# Patient Record
Sex: Female | Born: 1945
Health system: Southern US, Community
[De-identification: ages and names within clinical notes are randomized; demographics above are authoritative.]

## PROBLEM LIST (undated history)

## (undated) DIAGNOSIS — E119 Type 2 diabetes mellitus without complications: Secondary | ICD-10-CM

## (undated) DIAGNOSIS — I2510 Atherosclerotic heart disease of native coronary artery without angina pectoris: Secondary | ICD-10-CM

## (undated) DIAGNOSIS — J45909 Unspecified asthma, uncomplicated: Secondary | ICD-10-CM

## (undated) DIAGNOSIS — F0390 Unspecified dementia without behavioral disturbance: Secondary | ICD-10-CM

## (undated) DIAGNOSIS — K7689 Other specified diseases of liver: Secondary | ICD-10-CM

## (undated) DIAGNOSIS — N289 Disorder of kidney and ureter, unspecified: Secondary | ICD-10-CM

## (undated) DIAGNOSIS — T4145XA Adverse effect of unspecified anesthetic, initial encounter: Secondary | ICD-10-CM

## (undated) DIAGNOSIS — L0390 Cellulitis, unspecified: Secondary | ICD-10-CM

## (undated) DIAGNOSIS — I10 Essential (primary) hypertension: Secondary | ICD-10-CM

## (undated) DIAGNOSIS — I341 Nonrheumatic mitral (valve) prolapse: Secondary | ICD-10-CM

## (undated) DIAGNOSIS — F32A Depression, unspecified: Secondary | ICD-10-CM

## (undated) DIAGNOSIS — E039 Hypothyroidism, unspecified: Secondary | ICD-10-CM

## (undated) DIAGNOSIS — R42 Dizziness and giddiness: Secondary | ICD-10-CM

## (undated) DIAGNOSIS — J189 Pneumonia, unspecified organism: Secondary | ICD-10-CM

## (undated) DIAGNOSIS — I639 Cerebral infarction, unspecified: Secondary | ICD-10-CM

## (undated) DIAGNOSIS — Z972 Presence of dental prosthetic device (complete) (partial): Secondary | ICD-10-CM

## (undated) DIAGNOSIS — F329 Major depressive disorder, single episode, unspecified: Secondary | ICD-10-CM

## (undated) DIAGNOSIS — M1990 Unspecified osteoarthritis, unspecified site: Secondary | ICD-10-CM

## (undated) DIAGNOSIS — R059 Cough, unspecified: Secondary | ICD-10-CM

## (undated) DIAGNOSIS — E7800 Pure hypercholesterolemia, unspecified: Secondary | ICD-10-CM

## (undated) DIAGNOSIS — K219 Gastro-esophageal reflux disease without esophagitis: Secondary | ICD-10-CM

## (undated) DIAGNOSIS — T8859XA Other complications of anesthesia, initial encounter: Secondary | ICD-10-CM

## (undated) DIAGNOSIS — R413 Other amnesia: Secondary | ICD-10-CM

## (undated) DIAGNOSIS — G510 Bell's palsy: Secondary | ICD-10-CM

## (undated) DIAGNOSIS — K5792 Diverticulitis of intestine, part unspecified, without perforation or abscess without bleeding: Secondary | ICD-10-CM

## (undated) DIAGNOSIS — K2970 Gastritis, unspecified, without bleeding: Secondary | ICD-10-CM

## (undated) DIAGNOSIS — C801 Malignant (primary) neoplasm, unspecified: Secondary | ICD-10-CM

## (undated) DIAGNOSIS — Z8719 Personal history of other diseases of the digestive system: Secondary | ICD-10-CM

## (undated) DIAGNOSIS — G4730 Sleep apnea, unspecified: Secondary | ICD-10-CM

## (undated) DIAGNOSIS — R05 Cough: Secondary | ICD-10-CM

## (undated) HISTORY — PX: ABDOMINAL HYSTERECTOMY: SHX81

## (undated) HISTORY — PX: HEMORRHOID SURGERY: SHX153

## (undated) HISTORY — PX: CARPAL TUNNEL RELEASE: SHX101

## (undated) HISTORY — PX: CHOLECYSTECTOMY: SHX55

## (undated) HISTORY — PX: APPENDECTOMY: SHX54

## (undated) HISTORY — PX: LIVER SURGERY: SHX698

## (undated) HISTORY — PX: COLONOSCOPY: SHX174

---

## 1998-03-12 ENCOUNTER — Other Ambulatory Visit: Admission: RE | Admit: 1998-03-12 | Discharge: 1998-03-12 | Payer: Self-pay | Admitting: Family Medicine

## 1998-09-03 ENCOUNTER — Ambulatory Visit (HOSPITAL_COMMUNITY): Admission: RE | Admit: 1998-09-03 | Discharge: 1998-09-03 | Payer: Self-pay | Admitting: Otolaryngology

## 1998-09-14 ENCOUNTER — Ambulatory Visit (HOSPITAL_COMMUNITY): Admission: RE | Admit: 1998-09-14 | Discharge: 1998-09-14 | Payer: Self-pay

## 1999-07-19 ENCOUNTER — Observation Stay (HOSPITAL_COMMUNITY): Admission: EM | Admit: 1999-07-19 | Discharge: 1999-07-20 | Payer: Self-pay | Admitting: Emergency Medicine

## 1999-07-24 ENCOUNTER — Encounter: Admission: RE | Admit: 1999-07-24 | Discharge: 1999-07-24 | Payer: Self-pay | Admitting: Family Medicine

## 1999-10-31 ENCOUNTER — Other Ambulatory Visit: Admission: RE | Admit: 1999-10-31 | Discharge: 1999-10-31 | Payer: Self-pay | Admitting: Family Medicine

## 1999-11-07 ENCOUNTER — Encounter: Admission: RE | Admit: 1999-11-07 | Discharge: 1999-11-07 | Payer: Self-pay | Admitting: Family Medicine

## 1999-11-07 ENCOUNTER — Encounter: Payer: Self-pay | Admitting: Family Medicine

## 2000-04-20 ENCOUNTER — Encounter: Payer: Self-pay | Admitting: Family Medicine

## 2000-04-20 ENCOUNTER — Encounter: Admission: RE | Admit: 2000-04-20 | Discharge: 2000-04-20 | Payer: Self-pay | Admitting: Family Medicine

## 2000-04-28 ENCOUNTER — Encounter: Payer: Self-pay | Admitting: Family Medicine

## 2000-04-28 ENCOUNTER — Ambulatory Visit (HOSPITAL_COMMUNITY): Admission: RE | Admit: 2000-04-28 | Discharge: 2000-04-28 | Payer: Self-pay | Admitting: Family Medicine

## 2000-05-27 ENCOUNTER — Encounter: Payer: Self-pay | Admitting: Family Medicine

## 2000-05-27 ENCOUNTER — Encounter: Admission: RE | Admit: 2000-05-27 | Discharge: 2000-05-27 | Payer: Self-pay | Admitting: Family Medicine

## 2000-09-21 ENCOUNTER — Encounter: Payer: Self-pay | Admitting: Family Medicine

## 2000-09-21 ENCOUNTER — Encounter: Admission: RE | Admit: 2000-09-21 | Discharge: 2000-09-21 | Payer: Self-pay | Admitting: Family Medicine

## 2001-03-25 ENCOUNTER — Other Ambulatory Visit: Admission: RE | Admit: 2001-03-25 | Discharge: 2001-03-25 | Payer: Self-pay | Admitting: Family Medicine

## 2001-05-12 ENCOUNTER — Ambulatory Visit (HOSPITAL_BASED_OUTPATIENT_CLINIC_OR_DEPARTMENT_OTHER): Admission: RE | Admit: 2001-05-12 | Discharge: 2001-05-12 | Payer: Self-pay | Admitting: Orthopedic Surgery

## 2003-02-27 ENCOUNTER — Other Ambulatory Visit: Admission: RE | Admit: 2003-02-27 | Discharge: 2003-02-27 | Payer: Self-pay | Admitting: Family Medicine

## 2005-08-20 ENCOUNTER — Other Ambulatory Visit: Admission: RE | Admit: 2005-08-20 | Discharge: 2005-08-20 | Payer: Self-pay | Admitting: Family Medicine

## 2008-03-07 ENCOUNTER — Ambulatory Visit: Payer: Self-pay | Admitting: Internal Medicine

## 2008-03-07 ENCOUNTER — Inpatient Hospital Stay (HOSPITAL_BASED_OUTPATIENT_CLINIC_OR_DEPARTMENT_OTHER): Admission: RE | Admit: 2008-03-07 | Discharge: 2008-03-07 | Payer: Self-pay | Admitting: Cardiology

## 2009-10-01 ENCOUNTER — Ambulatory Visit: Payer: Self-pay | Admitting: Internal Medicine

## 2009-11-21 ENCOUNTER — Ambulatory Visit: Payer: Self-pay | Admitting: Otolaryngology

## 2009-12-20 ENCOUNTER — Ambulatory Visit: Payer: Self-pay | Admitting: Internal Medicine

## 2010-02-23 ENCOUNTER — Emergency Department: Payer: Self-pay | Admitting: Unknown Physician Specialty

## 2010-04-22 ENCOUNTER — Encounter: Payer: Self-pay | Admitting: Gastroenterology

## 2010-04-22 ENCOUNTER — Ambulatory Visit: Payer: Self-pay | Admitting: Gastroenterology

## 2010-05-28 ENCOUNTER — Ambulatory Visit: Payer: Self-pay | Admitting: Gastroenterology

## 2010-12-08 ENCOUNTER — Emergency Department: Payer: Self-pay | Admitting: Emergency Medicine

## 2010-12-08 ENCOUNTER — Encounter: Payer: Self-pay | Admitting: Radiology

## 2010-12-08 ENCOUNTER — Encounter: Payer: Self-pay | Admitting: Gastroenterology

## 2010-12-13 ENCOUNTER — Encounter: Payer: Self-pay | Admitting: Gastroenterology

## 2010-12-24 ENCOUNTER — Telehealth (INDEPENDENT_AMBULATORY_CARE_PROVIDER_SITE_OTHER): Payer: Self-pay | Admitting: *Deleted

## 2010-12-24 ENCOUNTER — Encounter (INDEPENDENT_AMBULATORY_CARE_PROVIDER_SITE_OTHER): Payer: Self-pay | Admitting: *Deleted

## 2010-12-24 DIAGNOSIS — R933 Abnormal findings on diagnostic imaging of other parts of digestive tract: Secondary | ICD-10-CM | POA: Insufficient documentation

## 2011-01-02 ENCOUNTER — Ambulatory Visit (HOSPITAL_COMMUNITY)
Admission: RE | Admit: 2011-01-02 | Discharge: 2011-01-02 | Disposition: A | Discharge status: Home / Self Care | Payer: Medicare Other | Source: Ambulatory Visit | Attending: Gastroenterology | Admitting: Gastroenterology

## 2011-01-02 ENCOUNTER — Encounter: Payer: Medicare Other | Admitting: Gastroenterology

## 2011-01-02 ENCOUNTER — Encounter: Payer: Self-pay | Admitting: Gastroenterology

## 2011-01-02 DIAGNOSIS — Z9089 Acquired absence of other organs: Secondary | ICD-10-CM | POA: Insufficient documentation

## 2011-01-02 DIAGNOSIS — E119 Type 2 diabetes mellitus without complications: Secondary | ICD-10-CM | POA: Insufficient documentation

## 2011-01-02 DIAGNOSIS — G4730 Sleep apnea, unspecified: Secondary | ICD-10-CM | POA: Insufficient documentation

## 2011-01-02 DIAGNOSIS — I10 Essential (primary) hypertension: Secondary | ICD-10-CM | POA: Insufficient documentation

## 2011-01-02 DIAGNOSIS — Z01812 Encounter for preprocedural laboratory examination: Secondary | ICD-10-CM | POA: Insufficient documentation

## 2011-01-02 DIAGNOSIS — K838 Other specified diseases of biliary tract: Secondary | ICD-10-CM

## 2011-01-02 DIAGNOSIS — R932 Abnormal findings on diagnostic imaging of liver and biliary tract: Secondary | ICD-10-CM

## 2011-01-02 DIAGNOSIS — K222 Esophageal obstruction: Secondary | ICD-10-CM | POA: Insufficient documentation

## 2011-01-02 DIAGNOSIS — Z800 Family history of malignant neoplasm of digestive organs: Secondary | ICD-10-CM | POA: Insufficient documentation

## 2011-01-02 LAB — GLUCOSE, CAPILLARY: Glucose-Capillary: 119 mg/dL — ABNORMAL HIGH (ref 70–99)

## 2011-01-02 NOTE — Letter (Signed)
Summary: Bunnie Pion MD/[facility]  Danella Penton MD/[facility]   Imported By: Lester [place] 12/27/2010 16:41:51  _____________________________________________________________________  External Attachment:    Type:   Image     Comment:   External Document

## 2011-01-02 NOTE — Procedures (Signed)
Summary: Lucas Mallow MD  Lucas Mallow MD   Imported By: Lester [place] 12/27/2010 16:45:35  _____________________________________________________________________  External Attachment:    Type:   Image     Comment:   External Document

## 2011-01-02 NOTE — Progress Notes (Signed)
Summary: EUS  Phone Note Outgoing Call Call back at [facility] Phone [phone]   Call placed by: Chales Abrahams CMA Duncan Dull),  December 24, 2010 3:58 PM Summary of Call: pt scheduled for EUS need to review meds and instruct pt pt also needs to hand carry ct on disc to appt Initial call taken by: Chales Abrahams CMA Duncan Dull),  December 24, 2010 4:04 PM  Follow-up for Phone Call        pt aware meds reviewed and instructed,  pt is taking metformina and she was advised to NOT take the metformin the day of the procedure. Follow-up by: Chales Abrahams CMA (AAMA),  December 25, 2010 1:32 PM  New Problems: NONSPECIFIC ABN FINDING RAD & OTH EXAM GI TRACT (ICD-793.4)   New Problems: NONSPECIFIC ABN FINDING RAD & OTH EXAM GI TRACT (ICD-793.4)

## 2011-01-02 NOTE — Procedures (Signed)
Summary: Upper Fl[place]ne Route MD  Upper Florene Route MD   Imported By: Lester [place] 12/27/2010 16:44:16  _____________________________________________________________________  External Attachment:    Type:   Image     Comment:   External Document

## 2011-01-02 NOTE — Letter (Signed)
Summary: EGD Instructions   Gastroenterology  [address] [place], [place] [address]   Phone: [phone]  Fax: [phone]       Cathy Hines    [birth year]    MRN: [id number]       Procedure Day /Date:01/02/11 THURS     Arrival Time:10 am     Procedure Time:11 am     Location of Procedure:                     [facility] ( Outpatient Registration)   PREPARATION FOR ENDOSCOPY   On 01/02/11  THE DAY OF THE PROCEDURE:  1.   No solid foods, milk or milk products are allowed after midnight the night before your procedure.  2.   Do not drink anything colored red or purple.  Avoid juices with pulp.  No orange juice.  3.  You may drink clear liquids until 7 am, which is 4 hours before your procedure.                                                                                                CLEAR LIQUIDS INCLUDE: Water Jello Ice Popsicles Tea (sugar ok, no milk/cream) Powdered fruit flavored drinks Coffee (sugar ok, no milk/cream) Gatorade Juice: apple, white grape, white cranberry  Lemonade Clear bullion, consomm, broth Carbonated beverages (any kind) Strained chicken noodle soup Hard Candy   MEDICATION INSTRUCTIONS  Unless otherwise instructed, you should take regular prescription medications with a small sip of water as early as possible the morning of your procedure.             OTHER INSTRUCTIONS  You will need a responsible adult at least 67 years of age to accompany you and drive you home.   This person must remain in the waiting room during your procedure.  Wear loose fitting clothing that is easily removed.  Leave jewelry and other valuables at home.  However, you may wish to bring a book to read or an iPod/MP3 player to listen to music as you wait for your procedure to start.  Remove all body piercing jewelry and leave at home.  Total time from sign-in until discharge is approximately 2-3 hours.  You should go home  directly after your procedure and rest.  You can resume normal activities the day after your procedure.  The day of your procedure you should not:   Drive   Make legal decisions   Operate machinery   Drink alcohol   Return to work  You will receive specific instructions about eating, activities and medications before you leave.    The above instructions have been reviewed and explained to me by   Chales Abrahams CMA (AAMA)  December 24, 2010 4:00 PM     I fully understand and can verbalize these instructions over the phone mailed to home Date 12/24/10

## 2011-01-08 NOTE — Procedures (Signed)
Summary: Endoscopic Ultrasound  Patient: Cathy Hines Note: All result statuses are Final unless otherwise noted.  Tests: (1) Endoscopic Ultrasound (EUS)  EUS Endoscopic Ultrasound                             DONE     [facility]     [address] [place], [place]  [address]           ENDOSCOPIC ULTRASOUND PROCEDURE REPORT           PATIENT:  Cathy Hines, Cathy Hines  MR#:  [id number]     BIRTHDATE:  [birth year]  GENDER:  female     ENDOSCOPIST:  Rachael Fee, MD     REFERRED by:   Lutricia Feil, MD at [facility]     PROCEDURE DATE:  01/02/2011     PROCEDURE:  Upper EUS     ASA CLASS:  Class II     INDICATIONS:  dilated CBD, normal liver tests, sister had     pancreatic cancer; cholecystectomy 10 years ago     MEDICATIONS:  Fentanyl 100 mcg IV, Versed 9 mg IV           DESCRIPTION OF PROCEDURE:   After the risks benefits and     alternatives of the procedure were  explained, informed consent     was obtained. The patient was then placed in the left, lateral,     decubitus postion and IV sedation was administered. Throughout the     procedure, the patient's blood pressure, pulse and oxygen     saturations were monitored continuously.  Under direct     visualization, the Pentax EUS Radial [id number] endoscope was     introduced through the mouth and advanced to the second portion of     the duodenum.  Water was used as necessary to provide an acoustic     interface.  Upon completion of the imaging, water was removed and     the patient was sent to the recovery room in satisfactory     condition.           <<PROCEDUREIMAGES>>     Endoscopic findings:     1. Minor schatzki's type ring at GE junction     2. Normal stomach     3. Normal duodenum; normal appearing major papilla visualized with     ERCP scope           EUS findings (using radial echoendoscope):1. Dilated CBD that     tapers smoothly into head of pancreas, major papilla. No CBD     stones. Max diameter  9mm.     2. Main pancreatic duct was normal in head, neck , body and tail     of gland.     3. Pancreatic parenchyma was normal; no masses or signs of chronic     pancreatitis.     4. Gallbladder surgically absent.     5. No peripancreatic or celiac adenopathy.     6. Limited views of liver, spleen, portal and splenic vessels were     all normal.           Impression:     Dilated CBD without any evident biliary or pancreatic pathology.     Likely the dilation is "physiologic," related to remote     cholecystectomy.     Schatzki's ring.  ______________________________     Rachael Fee, MD           n.     eSIGNED:   Rachael Fee at 01/02/2011 11:45 AM           Cathy Hines, [id number]  Note: An exclamation mark (!) indicates a result that was not dispersed into the flowsheet. Document Creation Date: 01/02/2011 11:46 AM _______________________________________________________________________  (1) Order result status: Final Collection or observation date-time: 01/02/2011 11:37 Requested date-time:  Receipt date-time:  Reported date-time:  Referring Physician:   Ordering Physician: Rob Bunting [phone]) Specimen Source:  Source: Launa Grill Order Number: [id number] Lab site:

## 2011-02-15 ENCOUNTER — Inpatient Hospital Stay: Payer: Self-pay | Admitting: Internal Medicine

## 2011-03-18 ENCOUNTER — Ambulatory Visit: Payer: Self-pay | Admitting: Internal Medicine

## 2011-04-01 NOTE — H&P (Signed)
NAMEREBECAH, DANGERFIELD NO.:  [id number]   MEDICAL RECORD NO.:  [id number]          PATIENT TYPE:   LOCATION:                                 FACILITY:   PHYSICIAN:  Peter M. Swaziland, M.D.  DATE OF BIRTH:  06-16-1946   DATE OF ADMISSION:  DATE OF DISCHARGE:                              HISTORY & PHYSICAL   HISTORY OF PRESENT ILLNESS:  Ms. Givhan is a 65 year old white female  with multiple cardiac risk factors who presents with a 74-month history  of increasing anginal symptoms.  She describes this as a severe  tightness in her chest and pain, feels like pressure associated with the  shortness of breath and dizziness.  She also notes that she has been  giving out very easily with marked fatigue.  She has been taking a lot  of antacids without much improvement.  Her symptoms are worse with  exertion, they typically last 5-10 minutes and are relieved with rest.  She was subsequently referred for adenosine Cardiolite study which was  performed on February 28, 2008.  This study was abnormal, showing evidence  of anterior wall ischemia.  Ejection fraction was normal at 70%.  The  patient is now referred for cardiac catheterization.   PAST MEDICAL HISTORY:  1. Diabetes mellitus type 2.  2. Hypercholesterolemia.  3. History of mitral valve prolapse.  4. Hypertension.  5. History of fibromyalgia.  6. History of hypothyroidism.  7. GERD.   PAST SURGICAL HISTORY:  Prior surgeries include cholecystectomy, total  hysterectomy, and hemorrhoidectomy.   ALLERGIES:  She is allergic to SULFA.   MEDICATIONS:  She also has multiple drug intolerance to cholesterol-  lowering medications, although the patient cannot recall which ones  specifically.  She was recently placed on WelChol.  Her current  medications include;  1. Lisinopril 30 mg per day.  2. Levoxyl daily.  3. Furosemide 20 mg per day.  4. WelChol 625 mg daily.  5. Nitroglycerin 0.4 mg p.r.n.  6. Ranitidine 150 mg  daily.  7. Acyclovir p.r.n.   SOCIAL HISTORY:  The patient is unemployed.  She is married.  She has 4  children, one daughter has a history of kidney cancer, one daughter has  a history of breast cancer.  The patient had previously been a 3- to 4-  pack per day smoker for over 20 years, but quit in 1997.  She denies  alcohol use.   FAMILY HISTORY:  Father died at age 61 with stroke.  He also had a  history of coronary disease and myocardial infarction.  Mother died at  age 75 with gangrene.  She also had a history of coronary disease and  hypertension.  Two brothers have passed away, one with coronary disease  and one had heart attack and question of aneurysm.  Three brothers are  still living.  Three sisters are still living.  One sister has a history  of pancreatic cancer, and one sister has passed away in a motor vehicle  accident.   REVIEW OF SYSTEMS:  The patient denies any significant claudication  symptoms.  She has had no history of TIA or stroke.  She is currently on  no diabetic medication, but is being managed with diet.  She again notes  multiple intolerances to several cholesterol-lowering medications.  She  states she has been told, she may possibly have multiple sclerosis and  possibly have sleep apnea.  Review of systems is, otherwise, negative in  detail.   PHYSICAL EXAMINATION:  GENERAL:  The patient is an overweight, white  female in no apparent distress.  VITAL SIGNS:  Weight is 168, blood pressure is 120/80, pulse is 78 and  regular, and respirations are 20 and unlabored.  HEENT:  She is normocephalic and atraumatic.  Pupils are equal, round,  reactive to light and accommodation.  Extraocular movements are full.  Oropharynx is clear.  NECK:  Supple without JVD, adenopathy, thyromegaly, or bruits.  LUNGS:  Clear to auscultation and percussion.  CARDIAC:  Reveals regular rate and rhythm without gallop, murmur, rub,  or click.  ABDOMEN:  Soft and nontender  without masses or bruits.  EXTREMITIES:  Femoral and pedal pulses 2+ and symmetric.  She has no  edema.  NEUROLOGIC:  Nonfocal.   LABORATORY DATA:  Chest x-ray showed no active disease.  Recent lipid  panel by Dr. Nathanial Rancher showed a total cholesterol of 232, HDL 39, LDL 163,  and triglycerides of 138.  ECG at rest shows normal sinus rhythm with  poor R-wave progression with no active ST, and electrolytes were normal.  Coags were normal.  CBC was normal.   IMPRESSION:  1. Recent onset of angina with abnormal adenosine Cardiolite study      showing evidence of anterior wall ischemia.  2. Diabetes mellitus type 2, diet regulated.  3. Hypercholesterolemia.  4. Hypertension.  5. Family history of early vascular disease.  6. Hypothyroidism.  7. Gastroesophageal reflux disease.  8. Status post cholecystectomy.   PLAN:  We will proceed with diagnostic cardiac catheterization.  I have  recommended the patient to start on aspirin daily, started her on Coreg  CR 10 mg per day.  Further therapy pending these results.           ______________________________  Peter M. Swaziland, M.D.     PMJ/MEDQ  D:  03/06/2008  T:  03/07/2008  Job:  [id number]   cc:   Burnell Blanks, MD

## 2011-04-01 NOTE — Cardiovascular Report (Signed)
Cathy Hines, Cathy Hines NO.:  [id number]   MEDICAL RECORD NO.:  [id number]          PATIENT TYPE:  OIB   LOCATION:  1965                         FACILITY:  [facility]   PHYSICIAN:  Peter M. Swaziland, M.D.  DATE OF BIRTH:  [birth year]   DATE OF PROCEDURE:  DATE OF DISCHARGE:                            CARDIAC CATHETERIZATION   INDICATION FOR PROCEDURE:  The patient is a 65 year old white female  with a history of diabetes, family history of coronary artery disease,  hypertension, hypercholesterolemia, presents with chest pain.  She had a  stress Cardiolite study, which was consistent with anterior wall  ischemia.  Left ventricular function was normal.   PROCEDURES:  Left heart catheterization, coronary and left ventricular  angiography.   EQUIPMENT:  4-French 4-cm left Judkins catheter, 4-French 3-D RCA  catheter, 4-French pigtail catheter, 4-French arterial sheath.   MEDICATIONS:  Local anesthesia 1% lidocaine, Versed 2 mg IV, contrast  100 mL of Omnipaque.  Access via the right femoral artery using the  standard Seldinger technique.   HEMODYNAMIC DATA:  Aortic pressure was 157/70 with a mean of 106.  Left  ventricle pressure was 157 with EDP of 25 mmHg.   ANGIOGRAPHIC DATA:  The left coronary artery rises and distributes  normally.  The left main coronary artery is normal.   The left anterior descending artery has a 20% narrowing proximally.  The  mid-to-distal vessel was small and very tortuous, but extends out to the  apex and no significant disease is noted.   Left circumflex coronary has a focal 20-30% stenosis in the midvessel.   The right coronary artery is a dominant vessel.  It has minor  irregularities in the proximal mid vessel less than 10%.   Left ventricular angiography was performed in RAO view.  This  demonstrates normal left ventricular size and contractility with normal  systolic function.  Ejection fraction is estimated at 60%.  There is  mild  prolapse of the posterior leaflet of the mitral valve without  significant mitral insufficiency.   FINAL INTERPRETATION:  1. Mild nonobstructive atherosclerotic coronary artery disease.  2. Normal left ventricular function.  3. Mild posterior mitral valve prolapse.   PLAN:  We would recommend evaluation for noncardiac causes of chest  pain.           ______________________________  Peter M. Swaziland, M.D.     PMJ/MEDQ  D:  03/07/2008  T:  03/08/2008  Job:  [id number]   cc:   Burnell Blanks, MD

## 2011-04-04 NOTE — Op Note (Signed)
[facility]  Patient:    Cathy Hines, Cathy Hines                        MRN: [id number] Proc. Date: 05/12/01 Attending:  Artist Pais. Mina Marble, M.D.                           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  PROCEDURE:  Decompression ulnar nerve, left elbow and left wrist, through separate incisions.  SURGEON:  Artist Pais. Mina Marble, M.D.  ASSISTANT:  Junius Roads. Ireton, P.A.C.  ANESTHESIA:  General.  TOURNIQUET TIME:  48 minutes.  COMPLICATIONS:  None.  DRAINS:  None.  DESCRIPTION OF PROCEDURE:  The patient was taken to the operating room and after the induction of adequate general anesthesia, the left upper extremity was prepped and draped in the usual sterile fashion.  Once this was done, an Esmarch was used to exsanguinate the limb, and the tourniquet was inflated to 250 mmHg.  At this point in time, the ulnar nerve at the wrist was approached through a Brunner-type incision in the area of Guyons canal.  The proximal extent of the incision was longitudinal overlying the SCU tendon and was then extended across the wrist crease in a Brunner-type fashion.  Flaps were raised accordingly.  The ulnar nerve and artery were identified proximal to Guyons canal, and the ulnar nerve was traced until the division of the deep motor branch and the superficial branch.  The nerve was completely decompressed.  It was extended through an area of 4 cm.  Once this was done, the wound was irrigated and closed with 5-0 nylon in a combination of simple and horizontal mattress sutures.  Next, the elbow was bent to 90 degrees and a 4-5 cm incision was made in the space between the medial epicondyle and the olecranon process.  The incision was taken down through the skin and subcutaneous tissues with care to  carefully identify and retract branches of the medial antebrachial cutaneous nerve.  Once this was done, the ulnar nerve was identified proximal to the cubital tunnel, was decompressed throughout the length of the cubital tunnel.  Distally it was decompressed to the heads of the flexor carpi ulnaris muscle bellies and proximally to the area of the intermuscular septum.  A small bit of the intermuscular septum and triceps was removed.  The wound was then thoroughly irrigated, hemostasis was achieved with bipolar cautery, and the wound was then closed with 4-0 Monocryl in a running subcuticular stitch.  Steri-Strips, 4 x 4s, fluffs, and a compressive dressing were applied.  The patient was then placed in a splint with the elbow flexed to 90 degrees and the wrist in slight extension with volar splinting. The patient tolerated the procedure well, went to the recovery room in stable fashion. DD:  05/12/01 TD:  05/12/01 Job: [id number]

## 2012-04-28 ENCOUNTER — Ambulatory Visit: Payer: Self-pay | Admitting: Internal Medicine

## 2012-05-06 ENCOUNTER — Ambulatory Visit: Payer: Self-pay | Admitting: Internal Medicine

## 2012-05-13 ENCOUNTER — Ambulatory Visit: Payer: Self-pay | Admitting: Gastroenterology

## 2012-07-29 ENCOUNTER — Ambulatory Visit: Payer: Self-pay | Admitting: Gastroenterology

## 2012-08-03 LAB — PATHOLOGY REPORT

## 2013-05-31 ENCOUNTER — Ambulatory Visit: Payer: Self-pay | Admitting: Internal Medicine

## 2014-06-26 ENCOUNTER — Ambulatory Visit: Payer: Self-pay | Admitting: Internal Medicine

## 2015-03-22 DIAGNOSIS — E119 Type 2 diabetes mellitus without complications: Secondary | ICD-10-CM | POA: Diagnosis not present

## 2015-03-22 DIAGNOSIS — K5792 Diverticulitis of intestine, part unspecified, without perforation or abscess without bleeding: Secondary | ICD-10-CM | POA: Diagnosis not present

## 2015-03-26 DIAGNOSIS — E119 Type 2 diabetes mellitus without complications: Secondary | ICD-10-CM | POA: Diagnosis not present

## 2015-03-26 DIAGNOSIS — K5792 Diverticulitis of intestine, part unspecified, without perforation or abscess without bleeding: Secondary | ICD-10-CM | POA: Diagnosis not present

## 2015-05-08 ENCOUNTER — Ambulatory Visit
Admission: RE | Admit: 2015-05-08 | Discharge: 2015-05-08 | Disposition: A | Discharge status: Home / Self Care | Payer: Commercial Managed Care - HMO | Source: Ambulatory Visit | Attending: Internal Medicine | Admitting: Internal Medicine

## 2015-05-08 ENCOUNTER — Other Ambulatory Visit: Payer: Self-pay | Admitting: Internal Medicine

## 2015-05-08 DIAGNOSIS — R079 Chest pain, unspecified: Secondary | ICD-10-CM

## 2015-05-08 DIAGNOSIS — R0600 Dyspnea, unspecified: Secondary | ICD-10-CM | POA: Diagnosis present

## 2015-05-08 DIAGNOSIS — M5010 Cervical disc disorder with radiculopathy, unspecified cervical region: Secondary | ICD-10-CM | POA: Diagnosis not present

## 2015-05-08 DIAGNOSIS — J439 Emphysema, unspecified: Secondary | ICD-10-CM | POA: Insufficient documentation

## 2015-05-08 DIAGNOSIS — R918 Other nonspecific abnormal finding of lung field: Secondary | ICD-10-CM | POA: Diagnosis not present

## 2015-05-08 HISTORY — DX: Type 2 diabetes mellitus without complications: E11.9

## 2015-05-08 HISTORY — DX: Essential (primary) hypertension: I10

## 2015-05-08 HISTORY — DX: Disorder of kidney and ureter, unspecified: N28.9

## 2015-05-08 HISTORY — DX: Malignant (primary) neoplasm, unspecified: C80.1

## 2015-05-10 DIAGNOSIS — R002 Palpitations: Secondary | ICD-10-CM | POA: Diagnosis not present

## 2015-05-10 DIAGNOSIS — M5010 Cervical disc disorder with radiculopathy, unspecified cervical region: Secondary | ICD-10-CM | POA: Diagnosis not present

## 2015-05-10 DIAGNOSIS — M659 Synovitis and tenosynovitis, unspecified: Secondary | ICD-10-CM | POA: Diagnosis not present

## 2015-05-23 ENCOUNTER — Other Ambulatory Visit: Payer: Self-pay | Admitting: Internal Medicine

## 2015-05-23 DIAGNOSIS — Z0000 Encounter for general adult medical examination without abnormal findings: Secondary | ICD-10-CM | POA: Diagnosis not present

## 2015-05-23 DIAGNOSIS — Z1231 Encounter for screening mammogram for malignant neoplasm of breast: Secondary | ICD-10-CM

## 2015-05-23 DIAGNOSIS — E119 Type 2 diabetes mellitus without complications: Secondary | ICD-10-CM | POA: Diagnosis not present

## 2015-06-27 DIAGNOSIS — J4522 Mild intermittent asthma with status asthmaticus: Secondary | ICD-10-CM | POA: Diagnosis not present

## 2015-06-27 DIAGNOSIS — J40 Bronchitis, not specified as acute or chronic: Secondary | ICD-10-CM | POA: Diagnosis not present

## 2015-06-28 ENCOUNTER — Ambulatory Visit: Payer: BLUE CROSS/BLUE SHIELD

## 2015-07-04 ENCOUNTER — Ambulatory Visit: Payer: BLUE CROSS/BLUE SHIELD

## 2015-07-11 ENCOUNTER — Ambulatory Visit: Payer: BLUE CROSS/BLUE SHIELD

## 2015-09-20 DIAGNOSIS — R197 Diarrhea, unspecified: Secondary | ICD-10-CM | POA: Diagnosis not present

## 2015-09-20 DIAGNOSIS — E039 Hypothyroidism, unspecified: Secondary | ICD-10-CM | POA: Diagnosis not present

## 2015-09-20 DIAGNOSIS — E119 Type 2 diabetes mellitus without complications: Secondary | ICD-10-CM | POA: Diagnosis not present

## 2015-10-03 ENCOUNTER — Ambulatory Visit
Admission: RE | Admit: 2015-10-03 | Discharge: 2015-10-03 | Disposition: A | Discharge status: Home / Self Care | Payer: Commercial Managed Care - HMO | Source: Ambulatory Visit | Attending: Internal Medicine | Admitting: Internal Medicine

## 2015-10-03 ENCOUNTER — Other Ambulatory Visit: Payer: Self-pay | Admitting: Internal Medicine

## 2015-10-03 DIAGNOSIS — Z1231 Encounter for screening mammogram for malignant neoplasm of breast: Secondary | ICD-10-CM | POA: Diagnosis not present

## 2015-10-26 DIAGNOSIS — L659 Nonscarring hair loss, unspecified: Secondary | ICD-10-CM | POA: Diagnosis not present

## 2015-10-30 DIAGNOSIS — L659 Nonscarring hair loss, unspecified: Secondary | ICD-10-CM | POA: Diagnosis not present

## 2015-10-30 DIAGNOSIS — L648 Other androgenic alopecia: Secondary | ICD-10-CM | POA: Diagnosis not present

## 2015-11-28 DIAGNOSIS — E119 Type 2 diabetes mellitus without complications: Secondary | ICD-10-CM | POA: Diagnosis not present

## 2015-11-28 DIAGNOSIS — M5010 Cervical disc disorder with radiculopathy, unspecified cervical region: Secondary | ICD-10-CM | POA: Diagnosis not present

## 2015-12-20 ENCOUNTER — Other Ambulatory Visit: Payer: Self-pay | Admitting: Internal Medicine

## 2015-12-20 DIAGNOSIS — I639 Cerebral infarction, unspecified: Secondary | ICD-10-CM

## 2015-12-20 DIAGNOSIS — R55 Syncope and collapse: Secondary | ICD-10-CM | POA: Diagnosis not present

## 2015-12-25 DIAGNOSIS — I493 Ventricular premature depolarization: Secondary | ICD-10-CM | POA: Diagnosis not present

## 2015-12-25 DIAGNOSIS — R55 Syncope and collapse: Secondary | ICD-10-CM | POA: Diagnosis not present

## 2015-12-31 DIAGNOSIS — I639 Cerebral infarction, unspecified: Secondary | ICD-10-CM | POA: Diagnosis not present

## 2015-12-31 DIAGNOSIS — R55 Syncope and collapse: Secondary | ICD-10-CM | POA: Diagnosis not present

## 2016-01-03 DIAGNOSIS — I493 Ventricular premature depolarization: Secondary | ICD-10-CM | POA: Diagnosis not present

## 2016-01-03 DIAGNOSIS — R55 Syncope and collapse: Secondary | ICD-10-CM | POA: Diagnosis not present

## 2016-01-10 ENCOUNTER — Ambulatory Visit
Admission: RE | Admit: 2016-01-10 | Discharge: 2016-01-10 | Disposition: A | Discharge status: Home / Self Care | Payer: Commercial Managed Care - HMO | Source: Ambulatory Visit | Attending: Internal Medicine | Admitting: Internal Medicine

## 2016-01-10 DIAGNOSIS — R55 Syncope and collapse: Secondary | ICD-10-CM | POA: Diagnosis not present

## 2016-01-10 DIAGNOSIS — I639 Cerebral infarction, unspecified: Secondary | ICD-10-CM

## 2016-01-10 MED ORDER — GADOBENATE DIMEGLUMINE 529 MG/ML IV SOLN
15.0000 mL | Freq: Once | INTRAVENOUS | Status: AC | PRN
  Administered 2016-01-10: 14 mL via INTRAVENOUS

## 2016-01-29 DIAGNOSIS — N3090 Cystitis, unspecified without hematuria: Secondary | ICD-10-CM | POA: Diagnosis not present

## 2016-01-29 DIAGNOSIS — I493 Ventricular premature depolarization: Secondary | ICD-10-CM | POA: Diagnosis not present

## 2016-05-02 IMAGING — MG MM SCREENING BREAST TOMO BILATERAL
9 of 12 series · 9 of 28 positions shown · non-contrast
Comparison: Previous exam(s).

CLINICAL DATA: Screening.

EXAM:
DIGITAL SCREENING BILATERAL MAMMOGRAM WITH 3D TOMO WITH CAD

[R MLO synth-2D]
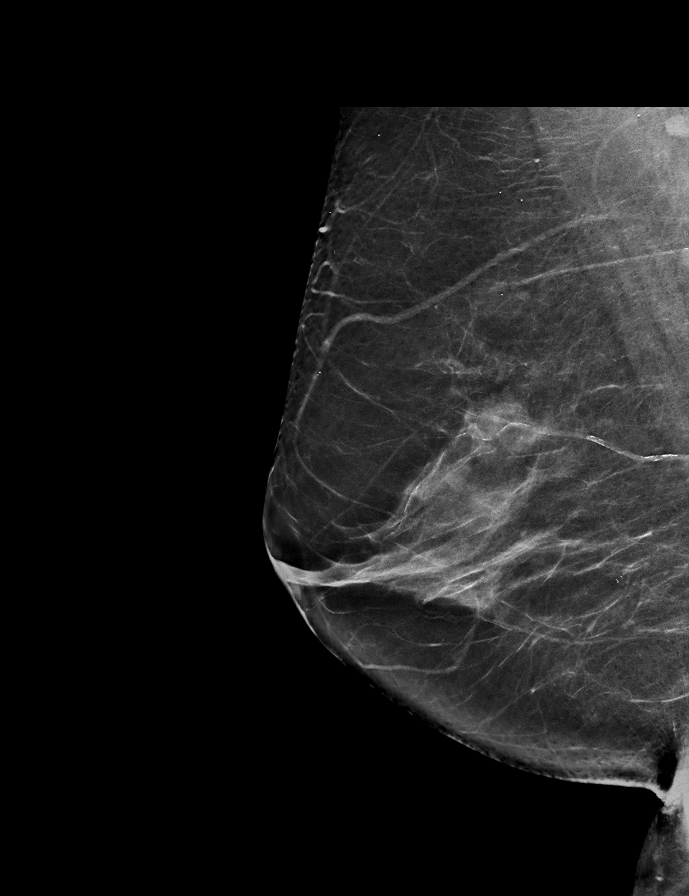

[R MLO]
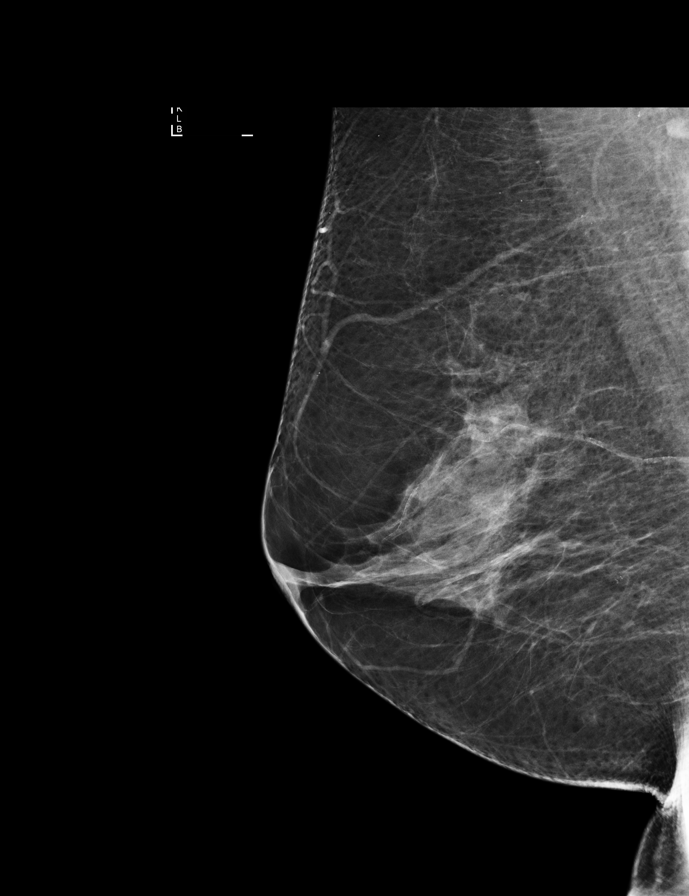

[L CC]
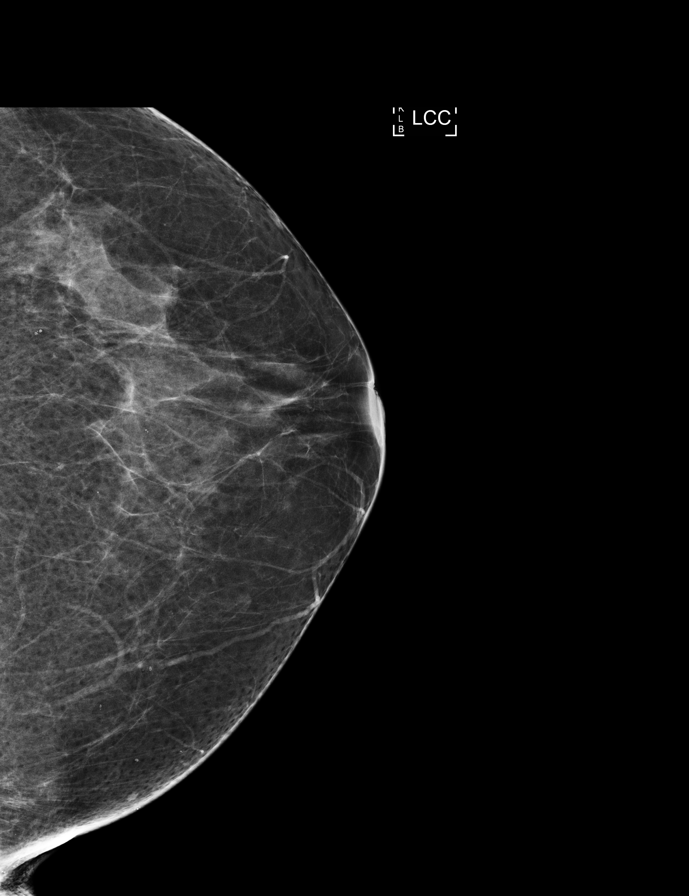

[L MLO]
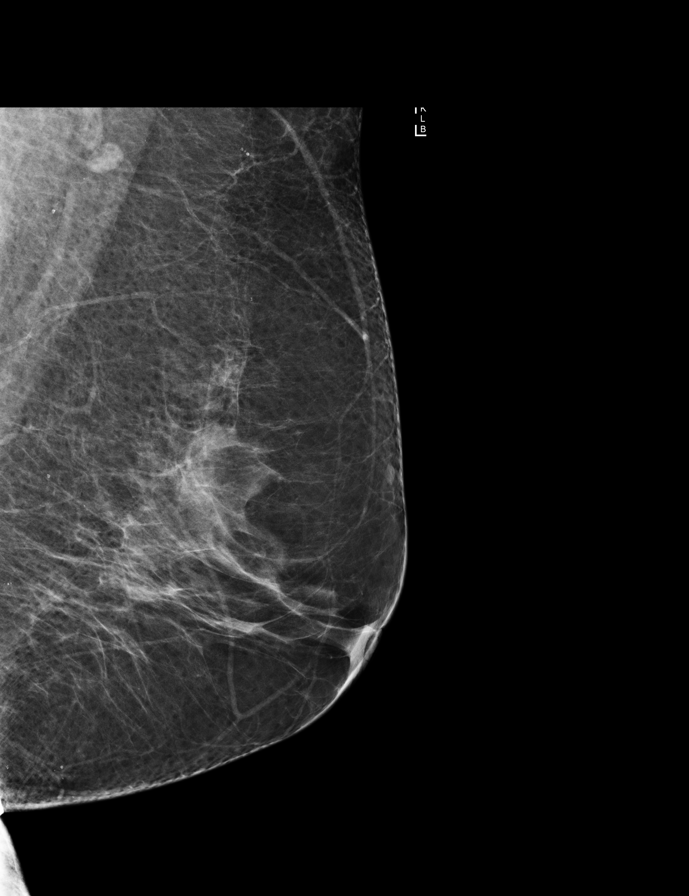

[R CC synth-2D]
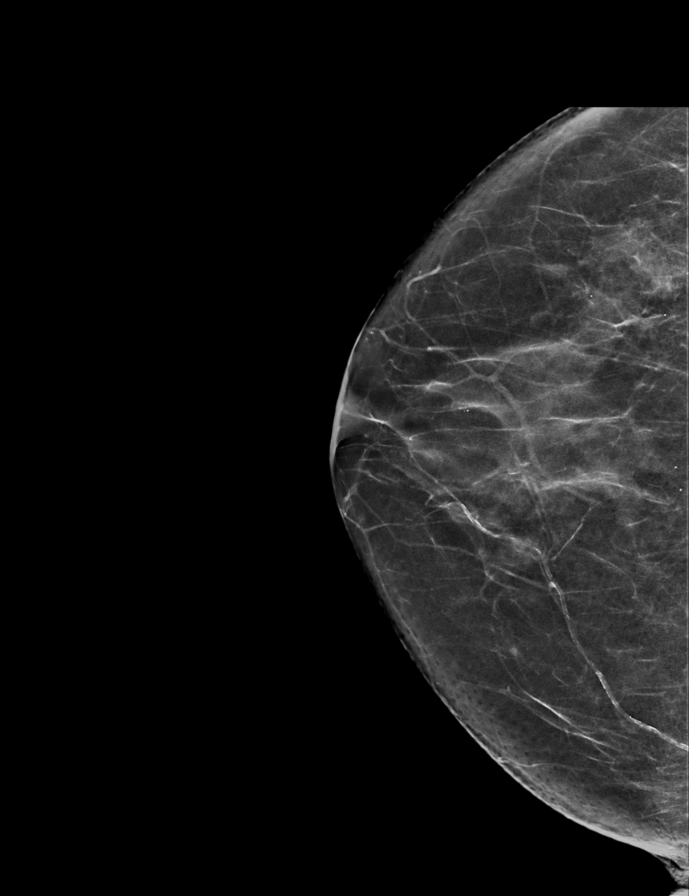

[L MLO synth-2D]
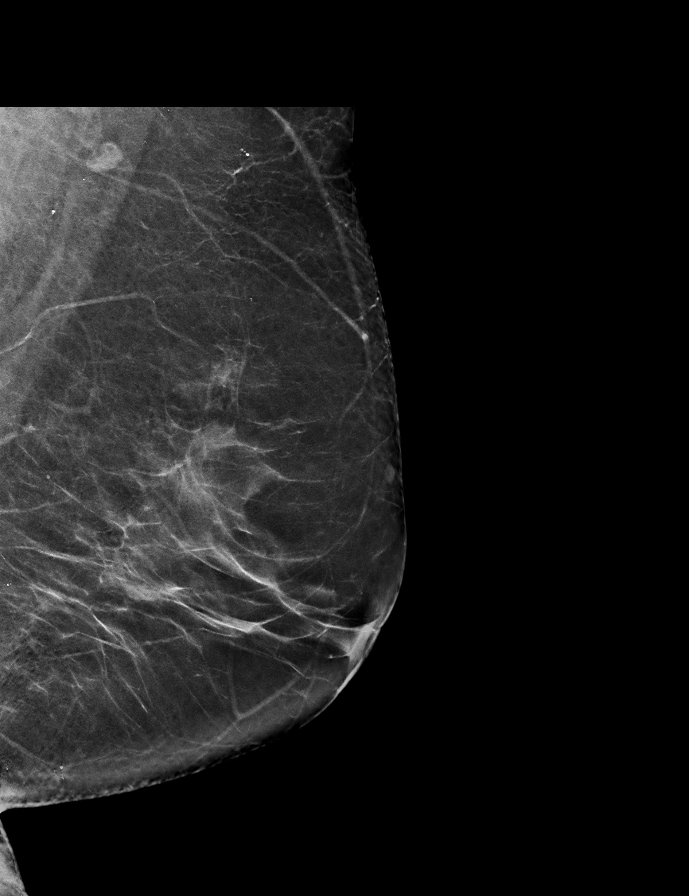

[L CC synth-2D]
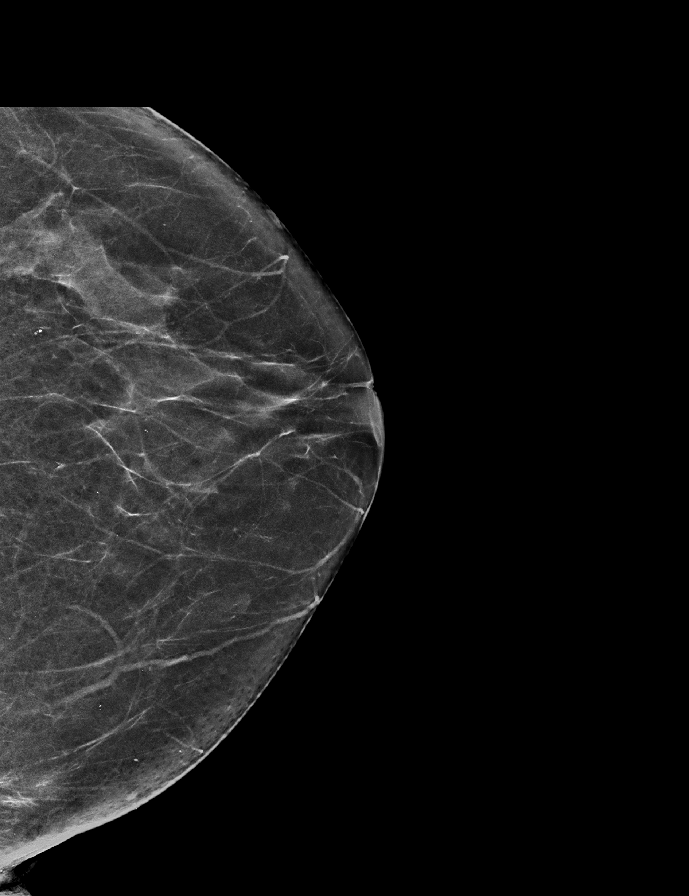

[R CC]
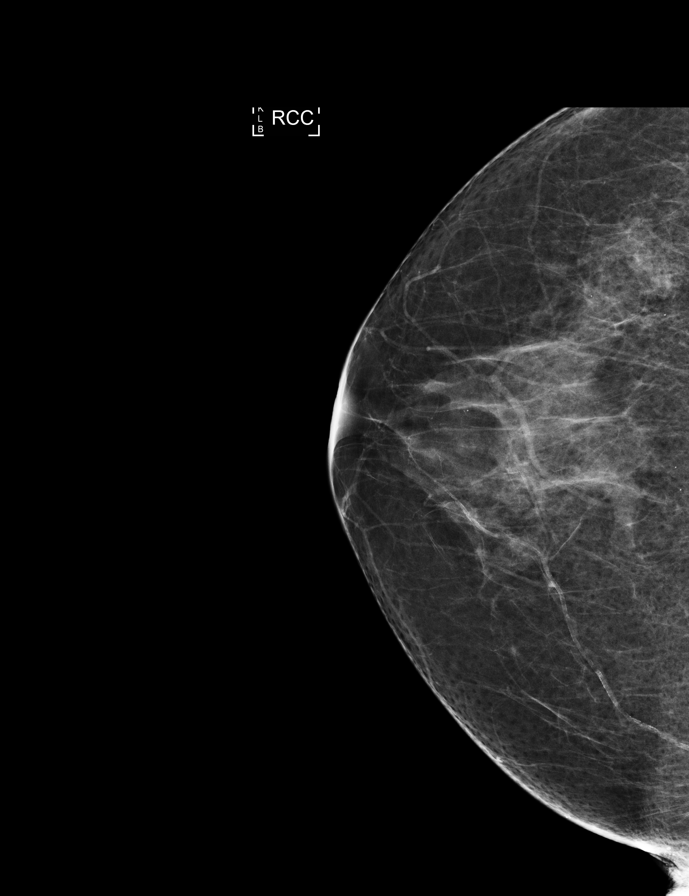

[L MLO tomo · tomo slice 37/73.0]
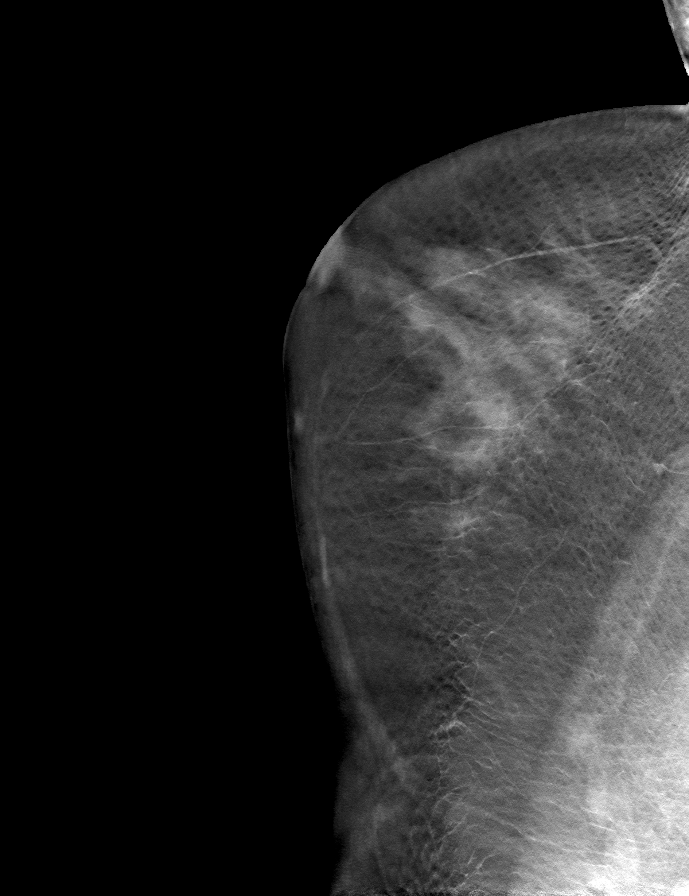

[9 of 28 positions shown; findings below may reference images not displayed]

ACR Breast Density Category b: There are scattered areas of
fibroglandular density.
FINDINGS: There are no findings suspicious for malignancy. Images were
processed with CAD.
IMPRESSION: No mammographic evidence of malignancy. A result letter of this
screening mammogram will be mailed directly to the patient.

RECOMMENDATION:
Screening mammogram in one year. (Code:55-L-23V)

BI-RADS CATEGORY  1: Negative.

## 2016-05-26 DIAGNOSIS — E119 Type 2 diabetes mellitus without complications: Secondary | ICD-10-CM | POA: Diagnosis not present

## 2016-06-02 DIAGNOSIS — M7061 Trochanteric bursitis, right hip: Secondary | ICD-10-CM | POA: Diagnosis not present

## 2016-06-02 DIAGNOSIS — R05 Cough: Secondary | ICD-10-CM | POA: Diagnosis not present

## 2016-06-02 DIAGNOSIS — E119 Type 2 diabetes mellitus without complications: Secondary | ICD-10-CM | POA: Diagnosis not present

## 2016-06-02 DIAGNOSIS — Z0000 Encounter for general adult medical examination without abnormal findings: Secondary | ICD-10-CM | POA: Diagnosis not present

## 2016-07-30 DIAGNOSIS — I160 Hypertensive urgency: Secondary | ICD-10-CM | POA: Diagnosis not present

## 2016-10-20 ENCOUNTER — Other Ambulatory Visit: Payer: Self-pay | Admitting: Internal Medicine

## 2016-10-20 DIAGNOSIS — Z1231 Encounter for screening mammogram for malignant neoplasm of breast: Secondary | ICD-10-CM

## 2016-10-23 DIAGNOSIS — E119 Type 2 diabetes mellitus without complications: Secondary | ICD-10-CM | POA: Diagnosis not present

## 2016-10-28 ENCOUNTER — Ambulatory Visit
Admission: RE | Admit: 2016-10-28 | Discharge: 2016-10-28 | Disposition: A | Discharge status: Home / Self Care | Payer: Commercial Managed Care - HMO | Source: Ambulatory Visit | Attending: Internal Medicine | Admitting: Internal Medicine

## 2016-10-28 DIAGNOSIS — Z1231 Encounter for screening mammogram for malignant neoplasm of breast: Secondary | ICD-10-CM | POA: Diagnosis not present

## 2016-10-29 ENCOUNTER — Ambulatory Visit: Payer: Commercial Managed Care - HMO

## 2016-11-18 DIAGNOSIS — R0781 Pleurodynia: Secondary | ICD-10-CM | POA: Diagnosis not present

## 2016-11-18 DIAGNOSIS — S299XXA Unspecified injury of thorax, initial encounter: Secondary | ICD-10-CM | POA: Diagnosis not present

## 2016-11-25 DIAGNOSIS — Z0000 Encounter for general adult medical examination without abnormal findings: Secondary | ICD-10-CM | POA: Diagnosis not present

## 2016-11-25 DIAGNOSIS — E119 Type 2 diabetes mellitus without complications: Secondary | ICD-10-CM | POA: Diagnosis not present

## 2016-12-09 DIAGNOSIS — E119 Type 2 diabetes mellitus without complications: Secondary | ICD-10-CM | POA: Diagnosis not present

## 2017-03-27 DIAGNOSIS — K4090 Unilateral inguinal hernia, without obstruction or gangrene, not specified as recurrent: Secondary | ICD-10-CM | POA: Diagnosis not present

## 2017-03-27 DIAGNOSIS — R1312 Dysphagia, oropharyngeal phase: Secondary | ICD-10-CM | POA: Diagnosis not present

## 2017-04-07 DIAGNOSIS — R5383 Other fatigue: Secondary | ICD-10-CM | POA: Diagnosis not present

## 2017-04-07 DIAGNOSIS — H8110 Benign paroxysmal vertigo, unspecified ear: Secondary | ICD-10-CM | POA: Diagnosis not present

## 2017-04-07 DIAGNOSIS — R110 Nausea: Secondary | ICD-10-CM | POA: Diagnosis not present

## 2017-04-07 DIAGNOSIS — E119 Type 2 diabetes mellitus without complications: Secondary | ICD-10-CM | POA: Diagnosis not present

## 2017-05-01 DIAGNOSIS — R05 Cough: Secondary | ICD-10-CM | POA: Diagnosis not present

## 2017-05-01 DIAGNOSIS — K5792 Diverticulitis of intestine, part unspecified, without perforation or abscess without bleeding: Secondary | ICD-10-CM | POA: Diagnosis not present

## 2017-05-01 DIAGNOSIS — R5383 Other fatigue: Secondary | ICD-10-CM | POA: Diagnosis not present

## 2017-06-08 DIAGNOSIS — E119 Type 2 diabetes mellitus without complications: Secondary | ICD-10-CM | POA: Diagnosis not present

## 2017-07-08 ENCOUNTER — Emergency Department: Payer: Medicare HMO

## 2017-07-08 ENCOUNTER — Emergency Department
Admission: EM | Admit: 2017-07-08 | Discharge: 2017-07-09 | Disposition: A | Discharge status: Home / Self Care | Payer: Medicare HMO | Attending: Emergency Medicine | Admitting: Emergency Medicine

## 2017-07-08 ENCOUNTER — Encounter: Payer: Self-pay | Admitting: Emergency Medicine

## 2017-07-08 DIAGNOSIS — Z87891 Personal history of nicotine dependence: Secondary | ICD-10-CM | POA: Diagnosis not present

## 2017-07-08 DIAGNOSIS — K5730 Diverticulosis of large intestine without perforation or abscess without bleeding: Secondary | ICD-10-CM | POA: Diagnosis not present

## 2017-07-08 DIAGNOSIS — I10 Essential (primary) hypertension: Secondary | ICD-10-CM | POA: Insufficient documentation

## 2017-07-08 DIAGNOSIS — K529 Noninfective gastroenteritis and colitis, unspecified: Secondary | ICD-10-CM

## 2017-07-08 DIAGNOSIS — R079 Chest pain, unspecified: Secondary | ICD-10-CM | POA: Diagnosis not present

## 2017-07-08 DIAGNOSIS — E119 Type 2 diabetes mellitus without complications: Secondary | ICD-10-CM | POA: Insufficient documentation

## 2017-07-08 DIAGNOSIS — R1012 Left upper quadrant pain: Secondary | ICD-10-CM | POA: Diagnosis present

## 2017-07-08 LAB — BASIC METABOLIC PANEL
Anion gap: 9 (ref 5–15)
BUN: 18 mg/dL (ref 6–20)
CO2: 26 mmol/L (ref 22–32)
Calcium: 9.5 mg/dL (ref 8.9–10.3)
Chloride: 104 mmol/L (ref 101–111)
Creatinine, Ser: 1.05 mg/dL — ABNORMAL HIGH (ref 0.44–1.00)
GFR calc Af Amer: 60 mL/min — ABNORMAL LOW (ref >60)
GFR, EST NON AFRICAN AMERICAN: 51 mL/min — AB (ref >60)
GLUCOSE: 258 mg/dL — AB (ref 65–99)
POTASSIUM: 3.3 mmol/L — AB (ref 3.5–5.1)
Sodium: 139 mmol/L (ref 135–145)

## 2017-07-08 LAB — CBC
HEMATOCRIT: 40 % (ref 35.0–47.0)
Hemoglobin: 13.7 g/dL (ref 12.0–16.0)
MCH: 29.2 pg (ref 26.0–34.0)
MCHC: 34.2 g/dL (ref 32.0–36.0)
MCV: 85.5 fL (ref 80.0–100.0)
Platelets: 209 10*3/uL (ref 150–440)
RBC: 4.67 MIL/uL (ref 3.80–5.20)
RDW: 13.3 % (ref 11.5–14.5)
WBC: 9.2 10*3/uL (ref 3.6–11.0)

## 2017-07-08 LAB — TROPONIN I

## 2017-07-08 MED ORDER — IOPAMIDOL (ISOVUE-300) INJECTION 61%
100.0000 mL | Freq: Once | INTRAVENOUS | Status: AC | PRN
  Administered 2017-07-08: 100 mL via INTRAVENOUS

## 2017-07-08 NOTE — ED Notes (Addendum)
Pt. States lt. Sided chest pain under lt. Breast.  Pt. States intermittent dull pain.   Pt. States her primary doctor took pt. Off metformin two weeks ago.  Pt. Also states not taking fluid pill on regular basis.  Pt. Denies pain at this time.  Pt. In room with husband.  Pt. States hx of mitral valve prolapse.

## 2017-07-08 NOTE — ED Triage Notes (Signed)
Patient ambulatory to triage with steady gait, without difficulty or distress noted; pt reports pain beneath left breast since last night; denies any accomp symptoms; st hx of same with HTN

## 2017-07-08 NOTE — ED Provider Notes (Signed)
[facility] Emergency Department Provider Note    First MD Initiated Contact with Patient 07/08/17 2326     (approximate)  I have reviewed the triage vital signs and the nursing notes.   HISTORY  Chief Complaint Chest Pain    HPI Cathy Hines is a 71 y.o. female present to the emergency department with left upper quadrant discomfort with onset last night. Patient states her current pain score is 2 out of 10 and described as uncomfortable. Patient denies any nausea no vomiting or diarrhea. Patient denies any dyspnea no diaphoresis or palpitations. Patient denies any fever or cough   Past Medical History:  Diagnosis Date  . Cancer ([place])    skin cancer on face and right arm- 2013  . Diabetes mellitus without complication ([place])    on met formin  . Hypertension    on HTN meds  . Renal insufficiency    horseshoe kidney    Patient Active Problem List   Diagnosis Date Noted  . NONSPECIFIC ABN FINDING RAD & OTH EXAM GI TRACT 12/24/2010    Past surgical history Noncontributory  Prior to Admission medications   Not on File    Allergies Flagyl [metronidazole] and Sulfa antibiotics  Family History  Problem Relation Age of Onset  . Breast cancer Mother   . Breast cancer Sister     Social History Social History  Substance Use Topics  . Smoking status: Former Smoker    Quit date: 1983  . Smokeless tobacco: Never Used  . Alcohol use No    Review of Systems Constitutional: No fever/chills Eyes: No visual changes. ENT: No sore throat. Cardiovascular: Denies chest pain. Respiratory: Denies shortness of breath. Gastrointestinal: Positive for abdominal pain.  No nausea, no vomiting.  No diarrhea.  No constipation. Genitourinary: Negative for dysuria. Musculoskeletal: Negative for neck pain.  Negative for back pain. Integumentary: Negative for rash. Neurological: Negative for headaches, focal weakness or  numbness.   ____________________________________________   PHYSICAL EXAM:  VITAL SIGNS: ED Triage Vitals  Enc Vitals Group     BP 07/08/17 2201 (!) 189/70     Pulse Rate 07/08/17 2201 80     Resp 07/08/17 2201 20     Temp 07/08/17 2201 97.9 F (36.6 C)     Temp Source 07/08/17 2201 Oral     SpO2 07/08/17 2201 99 %     Weight 07/08/17 2200 73 kg (161 lb)     Height 07/08/17 2200 1.499 m (_0 )     Head Circumference --      Peak Flow --      Pain Score 07/08/17 2200 2     Pain Loc --      Pain Edu? --      Excl. in [place]? --     Constitutional: Alert and oriented. Well appearing and in no acute distress. Eyes: Conjunctivae are normal. Head: Atraumatic. Mouth/Throat: Mucous membranes are moist.   Neck: No stridor. Cardiovascular: Normal rate, regular rhythm. Good peripheral circulation. Grossly normal heart sounds. Respiratory: Normal respiratory effort.  No retractions. Lungs CTAB. Gastrointestinal: Soft and nontender. No distention.  Musculoskeletal: No lower extremity tenderness nor edema. No gross deformities of extremities. Neurologic:  Normal speech and language. No gross focal neurologic deficits are appreciated.  Skin:  Skin is warm, dry and intact. No rash noted. Psychiatric: Mood and affect are normal. Speech and behavior are normal.  ____________________________________________   LABS (all labs ordered are listed, but only abnormal results  are displayed)  Labs Reviewed  BASIC METABOLIC PANEL - Abnormal; Notable for the following:       Result Value   Potassium 3.3 (*)    Glucose, Bld 258 (*)    Creatinine, Ser 1.05 (*)    GFR calc non Af Amer 51 (*)    GFR calc Af Amer 60 (*)    All other components within normal limits  CBC  TROPONIN I   ____________________________________________  EKG  ED ECG REPORT I, [place] N BROWN, the attending physician, personally viewed and interpreted this ECG.   Date: 07/09/2017  EKG Time: 10:04 PM  Rate: 74   Rhythm: Normal sinus rhythm  Axis: Normal  Intervals: Normal  ST&T Change: None  ____________________________________________  RADIOLOGY I,  N BROWN, personally viewed and evaluated these images (plain radiographs) as part of my medical decision making, as well as reviewing the written report by the radiologist.  Dg Chest 2 View  Result Date: 07/08/2017 CLINICAL DATA:  Chest pain EXAM: CHEST  2 VIEW COMPARISON:  None. FINDINGS: The heart size and mediastinal contours are within normal limits. Calcific aortic atherosclerosis. Both lungs are clear. The visualized skeletal structures are unremarkable. IMPRESSION: 1. No active cardiopulmonary disease. 2. Calcific Aortic Atherosclerosis (ICD10-I70.0). Electronically Signed   By: Ulyses Jarred M.D.   On: 07/08/2017 22:20      Procedures   ____________________________________________   INITIAL IMPRESSION / ASSESSMENT AND PLAN / ED COURSE  Pertinent labs & imaging results that were available during my care of the patient were reviewed by me and considered in my medical decision making (see chart for details).  71 year old female presenting with left upper quadrant discomfort. Patient denied any chest pain or shortness of breath. EKG revealed no evidence of ischemia or infarction. Laboratory data including troponin 2 negative. CT scan of the abdomen revealed findings consistent with possible enteritis. Patient has no pain at present      ____________________________________________  FINAL CLINICAL IMPRESSION(S) / ED DIAGNOSES  Final diagnoses:  Enteritis     MEDICATIONS GIVEN DURING THIS VISIT:  Medications - No data to display   NEW OUTPATIENT MEDICATIONS STARTED DURING THIS VISIT:  New Prescriptions   No medications on file    Modified Medications   No medications on file    Discontinued Medications   No medications on file     Note:  This document was prepared using Dragon voice recognition software  and may include unintentional dictation errors.    Gregor Hams, MD 07/09/17 [phone]

## 2017-07-09 DIAGNOSIS — K5730 Diverticulosis of large intestine without perforation or abscess without bleeding: Secondary | ICD-10-CM | POA: Diagnosis not present

## 2017-07-09 DIAGNOSIS — R079 Chest pain, unspecified: Secondary | ICD-10-CM | POA: Diagnosis not present

## 2017-07-09 LAB — TROPONIN I: Troponin I: <0.03 ng/mL (ref <0.03)

## 2017-07-09 MED ORDER — CLONIDINE HCL 0.1 MG PO TABS
ORAL_TABLET | ORAL | Status: AC
  Filled 2017-07-09: qty 1

## 2017-07-09 MED ORDER — CLONIDINE HCL 0.1 MG PO TABS
0.1000 mg | ORAL_TABLET | Freq: Once | ORAL | Status: AC
  Administered 2017-07-09: 0.1 mg via ORAL

## 2017-07-09 NOTE — ED Notes (Signed)
Pt. Ambulatory to bathroom. 

## 2017-07-09 NOTE — ED Notes (Signed)
Pt. Going home with husband. 

## 2017-07-28 DIAGNOSIS — E039 Hypothyroidism, unspecified: Secondary | ICD-10-CM | POA: Diagnosis not present

## 2017-07-28 DIAGNOSIS — Z0000 Encounter for general adult medical examination without abnormal findings: Secondary | ICD-10-CM | POA: Diagnosis not present

## 2017-07-28 DIAGNOSIS — E119 Type 2 diabetes mellitus without complications: Secondary | ICD-10-CM | POA: Diagnosis not present

## 2017-09-02 DIAGNOSIS — H04223 Epiphora due to insufficient drainage, bilateral lacrimal glands: Secondary | ICD-10-CM | POA: Diagnosis not present

## 2017-10-29 DIAGNOSIS — L574 Cutis laxa senilis: Secondary | ICD-10-CM | POA: Diagnosis not present

## 2017-10-29 DIAGNOSIS — H02831 Dermatochalasis of right upper eyelid: Secondary | ICD-10-CM | POA: Diagnosis not present

## 2017-10-29 DIAGNOSIS — H02834 Dermatochalasis of left upper eyelid: Secondary | ICD-10-CM | POA: Diagnosis not present

## 2017-11-23 DIAGNOSIS — E039 Hypothyroidism, unspecified: Secondary | ICD-10-CM | POA: Diagnosis not present

## 2017-11-23 DIAGNOSIS — E119 Type 2 diabetes mellitus without complications: Secondary | ICD-10-CM | POA: Diagnosis not present

## 2017-12-01 DIAGNOSIS — M79672 Pain in left foot: Secondary | ICD-10-CM | POA: Diagnosis not present

## 2017-12-01 DIAGNOSIS — Z0000 Encounter for general adult medical examination without abnormal findings: Secondary | ICD-10-CM | POA: Diagnosis not present

## 2017-12-01 DIAGNOSIS — E119 Type 2 diabetes mellitus without complications: Secondary | ICD-10-CM | POA: Diagnosis not present

## 2017-12-01 DIAGNOSIS — M79671 Pain in right foot: Secondary | ICD-10-CM | POA: Diagnosis not present

## 2017-12-01 DIAGNOSIS — E039 Hypothyroidism, unspecified: Secondary | ICD-10-CM | POA: Diagnosis not present

## 2017-12-04 DIAGNOSIS — M79672 Pain in left foot: Secondary | ICD-10-CM | POA: Diagnosis not present

## 2017-12-04 DIAGNOSIS — D2372 Other benign neoplasm of skin of left lower limb, including hip: Secondary | ICD-10-CM | POA: Diagnosis not present

## 2017-12-28 ENCOUNTER — Other Ambulatory Visit: Payer: Self-pay | Admitting: Internal Medicine

## 2017-12-28 DIAGNOSIS — Z1231 Encounter for screening mammogram for malignant neoplasm of breast: Secondary | ICD-10-CM

## 2018-01-14 ENCOUNTER — Ambulatory Visit
Admission: RE | Admit: 2018-01-14 | Discharge: 2018-01-14 | Disposition: A | Discharge status: Home / Self Care | Payer: Medicare HMO | Source: Ambulatory Visit | Attending: Internal Medicine | Admitting: Internal Medicine

## 2018-01-14 DIAGNOSIS — Z1231 Encounter for screening mammogram for malignant neoplasm of breast: Secondary | ICD-10-CM | POA: Diagnosis not present

## 2018-01-18 DIAGNOSIS — K219 Gastro-esophageal reflux disease without esophagitis: Secondary | ICD-10-CM | POA: Diagnosis not present

## 2018-03-11 DIAGNOSIS — S80812A Abrasion, left lower leg, initial encounter: Secondary | ICD-10-CM | POA: Diagnosis not present

## 2018-03-11 DIAGNOSIS — Z23 Encounter for immunization: Secondary | ICD-10-CM | POA: Diagnosis not present

## 2018-03-11 DIAGNOSIS — S81812A Laceration without foreign body, left lower leg, initial encounter: Secondary | ICD-10-CM | POA: Diagnosis not present

## 2018-03-11 DIAGNOSIS — E119 Type 2 diabetes mellitus without complications: Secondary | ICD-10-CM | POA: Diagnosis not present

## 2018-03-15 DIAGNOSIS — S80811D Abrasion, right lower leg, subsequent encounter: Secondary | ICD-10-CM | POA: Diagnosis not present

## 2018-03-15 DIAGNOSIS — M79604 Pain in right leg: Secondary | ICD-10-CM | POA: Diagnosis not present

## 2018-03-15 DIAGNOSIS — L03115 Cellulitis of right lower limb: Secondary | ICD-10-CM | POA: Diagnosis not present

## 2018-03-15 DIAGNOSIS — S8991XA Unspecified injury of right lower leg, initial encounter: Secondary | ICD-10-CM | POA: Diagnosis not present

## 2018-03-15 DIAGNOSIS — S81811D Laceration without foreign body, right lower leg, subsequent encounter: Secondary | ICD-10-CM | POA: Diagnosis not present

## 2018-03-19 DIAGNOSIS — L03115 Cellulitis of right lower limb: Secondary | ICD-10-CM | POA: Diagnosis not present

## 2018-03-19 DIAGNOSIS — R300 Dysuria: Secondary | ICD-10-CM | POA: Diagnosis not present

## 2018-03-19 DIAGNOSIS — S80811D Abrasion, right lower leg, subsequent encounter: Secondary | ICD-10-CM | POA: Diagnosis not present

## 2018-03-19 DIAGNOSIS — M79604 Pain in right leg: Secondary | ICD-10-CM | POA: Diagnosis not present

## 2018-03-23 DIAGNOSIS — S81801D Unspecified open wound, right lower leg, subsequent encounter: Secondary | ICD-10-CM | POA: Diagnosis not present

## 2018-03-23 DIAGNOSIS — M79604 Pain in right leg: Secondary | ICD-10-CM | POA: Diagnosis not present

## 2018-03-29 DIAGNOSIS — E119 Type 2 diabetes mellitus without complications: Secondary | ICD-10-CM | POA: Diagnosis not present

## 2018-03-29 DIAGNOSIS — E039 Hypothyroidism, unspecified: Secondary | ICD-10-CM | POA: Diagnosis not present

## 2018-03-30 DIAGNOSIS — S81801D Unspecified open wound, right lower leg, subsequent encounter: Secondary | ICD-10-CM | POA: Diagnosis not present

## 2018-03-30 DIAGNOSIS — E119 Type 2 diabetes mellitus without complications: Secondary | ICD-10-CM | POA: Diagnosis not present

## 2018-03-30 DIAGNOSIS — E039 Hypothyroidism, unspecified: Secondary | ICD-10-CM | POA: Diagnosis not present

## 2018-04-07 ENCOUNTER — Encounter: Payer: Medicare HMO | Attending: Internal Medicine | Admitting: Internal Medicine

## 2018-04-07 DIAGNOSIS — S81811A Laceration without foreign body, right lower leg, initial encounter: Secondary | ICD-10-CM | POA: Diagnosis not present

## 2018-04-07 DIAGNOSIS — E785 Hyperlipidemia, unspecified: Secondary | ICD-10-CM | POA: Insufficient documentation

## 2018-04-07 DIAGNOSIS — E1140 Type 2 diabetes mellitus with diabetic neuropathy, unspecified: Secondary | ICD-10-CM | POA: Insufficient documentation

## 2018-04-07 DIAGNOSIS — I4891 Unspecified atrial fibrillation: Secondary | ICD-10-CM | POA: Insufficient documentation

## 2018-04-07 DIAGNOSIS — E11622 Type 2 diabetes mellitus with other skin ulcer: Secondary | ICD-10-CM | POA: Diagnosis not present

## 2018-04-07 DIAGNOSIS — I341 Nonrheumatic mitral (valve) prolapse: Secondary | ICD-10-CM | POA: Insufficient documentation

## 2018-04-07 DIAGNOSIS — E1151 Type 2 diabetes mellitus with diabetic peripheral angiopathy without gangrene: Secondary | ICD-10-CM | POA: Insufficient documentation

## 2018-04-07 DIAGNOSIS — L97213 Non-pressure chronic ulcer of right calf with necrosis of muscle: Secondary | ICD-10-CM | POA: Insufficient documentation

## 2018-04-07 DIAGNOSIS — E039 Hypothyroidism, unspecified: Secondary | ICD-10-CM | POA: Insufficient documentation

## 2018-04-07 DIAGNOSIS — I10 Essential (primary) hypertension: Secondary | ICD-10-CM | POA: Diagnosis not present

## 2018-04-07 DIAGNOSIS — J45909 Unspecified asthma, uncomplicated: Secondary | ICD-10-CM | POA: Diagnosis not present

## 2018-04-07 DIAGNOSIS — I872 Venous insufficiency (chronic) (peripheral): Secondary | ICD-10-CM | POA: Insufficient documentation

## 2018-04-08 NOTE — Progress Notes (Addendum)
Cathy Hines, Cathy Hines ([id number]) Visit Report for 04/07/2018 Abuse/Suicide Risk Screen Details Patient Name: Cathy Hines, Cathy Hines Date of Service: 04/07/2018 3:15 PM Medical Record Number: [id number] Patient Account Number: [id number] Date of Birth/Sex: [birth year] (72 y.o. Female) Treating RN: Roger Shelter Primary Care Consuelo Suthers: Emily Filbert Other Clinician: Referring Velvia Mehrer: Emily Filbert Treating Via Rosado/Extender: Tito Dine in Treatment: 0 Abuse/Suicide Risk Screen Items Answer ABUSE/SUICIDE RISK SCREEN: Has anyone close to you tried to hurt or harm you recentlyo No Do you feel uncomfortable with anyone in your familyo No Has anyone forced you do things that you didnot want to doo No Do you have any thoughts of harming yourselfo No Patient displays signs or symptoms of abuse and/or neglect. No Electronic Signature(s) Signed: 04/07/2018 4:05:18 PM By: Roger Shelter Entered By: Roger Shelter on 04/07/2018 15:46:33 Cathy Hines ([id number]) -------------------------------------------------------------------------------- Activities of Daily Living Details Patient Name: Cathy Hines Date of Service: 04/07/2018 3:15 PM Medical Record Number: [id number] Patient Account Number: [id number] Date of Birth/Sex: Apr 07, 1946 (72 y.o. Female) Treating RN: Roger Shelter Primary Care Brooklynne Pereida: Emily Filbert Other Clinician: Referring Ikenna Ohms: Emily Filbert Treating Johni Narine/Extender: Tito Dine in Treatment: 0 Activities of Daily Living Items Answer Activities of Daily Living (Please select one for each item) Drive Automobile Completely Able Take Medications Completely Able Use Telephone Completely Able Care for Appearance Completely Able Use Toilet Completely Able Bath / Shower Completely Able Dress Self Completely Able Feed Self Completely Able Walk Completely Able Get In / Out Bed Completely Able Housework Completely Able Prepare Meals  Completely Able Handle Money Completely Able Shop for Self Completely Able Electronic Signature(s) Signed: 04/07/2018 4:05:18 PM By: Roger Shelter Entered By: Roger Shelter on 04/07/2018 15:46:58 Cathy Hines ([id number]) -------------------------------------------------------------------------------- Education Assessment Details Patient Name: Cathy Hines Date of Service: 04/07/2018 3:15 PM Medical Record Number: [id number] Patient Account Number: [id number] Date of Birth/Sex: 02/01/1946 (72 y.o. Female) Treating RN: Roger Shelter Primary Care Stace Peace: Emily Filbert Other Clinician: Referring Tierre Netto: Emily Filbert Treating Corrie Reder/Extender: Tito Dine in Treatment: 0 Primary Learner Assessed: Patient Learning Preferences/Education Level/Primary Language Learning Preference: Explanation Highest Education Level: Grade School Preferred Language: English Cognitive Barrier Assessment/Beliefs Language Barrier: No Translator Needed: No Memory Deficit: No Emotional Barrier: No Cultural/Religious Beliefs Affecting Medical Care: No Physical Barrier Assessment Impaired Vision: No Impaired Hearing: No Decreased Hand dexterity: No Knowledge/Comprehension Assessment Knowledge Level: High Comprehension Level: High Ability to understand written High instructions: Ability to understand verbal High instructions: Motivation Assessment Anxiety Level: Calm Cooperation: Cooperative Education Importance: Acknowledges Need Interest in Health Problems: Asks Questions Perception: Coherent Willingness to Engage in Self- High Management Activities: Readiness to Engage in Self- High Management Activities: Electronic Signature(s) Signed: 04/07/2018 4:05:18 PM By: Roger Shelter Entered By: Roger Shelter on 04/07/2018 15:47:46 Cathy Hines ([id number]) -------------------------------------------------------------------------------- Fall Risk  Assessment Details Patient Name: Cathy Hines Date of Service: 04/07/2018 3:15 PM Medical Record Number: [id number] Patient Account Number: [id number] Date of Birth/Sex: [birth year] (72 y.o. Female) Treating RN: Roger Shelter Primary Care Delania Ferg: Emily Filbert Other Clinician: Referring Enmanuel Zufall: Emily Filbert Treating Amariss Detamore/Extender: Tito Dine in Treatment: 0 Fall Risk Assessment Items Have you had 2 or more falls in the last 12 monthso 0 Yes Have you had any fall that resulted in injury in the last 12 monthso 0 Yes FALL RISK ASSESSMENT: History of falling - immediate or within 3 months 25 Yes Secondary diagnosis 0 No Ambulatory aid None/bed rest/wheelchair/nurse 0 Yes Crutches/cane/walker 0 No Furniture 0 No  IV Access/Saline Lock 0 No Gait/Training Normal/bed rest/immobile 0 No Weak 0 No Impaired 0 No Mental Status Oriented to own ability 0 No Electronic Signature(s) Signed: 04/07/2018 4:05:18 PM By: Roger Shelter Entered By: Roger Shelter on 04/07/2018 15:48:34 Cathy Hines ([id number]) -------------------------------------------------------------------------------- Foot Assessment Details Patient Name: Cathy Hines Date of Service: 04/07/2018 3:15 PM Medical Record Number: [id number] Patient Account Number: [id number] Date of Birth/Sex: 1946/03/21 (72 y.o. Female) Treating RN: Cornell Barman Primary Care Halona Amstutz: Emily Filbert Other Clinician: Referring Caprice Wasko: Emily Filbert Treating Chaunda Vandergriff/Extender: Tito Dine in Treatment: 0 Foot Assessment Items Site Locations + = Sensation present, - = Sensation absent, C = Callus, U = Ulcer R = Redness, W = Warmth, M = Maceration, PU = Pre-ulcerative lesion F = Fissure, S = Swelling, D = Dryness Assessment Right: Left: Other Deformity: No No Prior Foot Ulcer: No No Prior Amputation: No No Charcot Joint: No No Ambulatory Status: Ambulatory Without Help Gait: Steady Electronic  Signature(s) Signed: 04/07/2018 4:58:46 PM By: Gretta Cool, BSN, RN, CWS, Kim RN, BSN Entered By: Gretta Cool, BSN, RN, CWS, Kim on 04/07/2018 16:58:46 Cathy Hines ([id number]) -------------------------------------------------------------------------------- Nutrition Risk Assessment Details Patient Name: Cathy Hines Date of Service: 04/07/2018 3:15 PM Medical Record Number: [id number] Patient Account Number: [id number] Date of Birth/Sex: August 07, 1946 (72 y.o. Female) Treating RN: Roger Shelter Primary Care Heatherly Stenner: Emily Filbert Other Clinician: Referring Tedford Berg: Emily Filbert Treating Oryan Winterton/Extender: Tito Dine in Treatment: 0 Height (in): 59 Weight (lbs): 163.4 Body Mass Index (BMI): 33 Nutrition Risk Assessment Items NUTRITION RISK SCREEN: I have an illness or condition that made me change the kind and/or amount of 0 No food I eat I eat fewer than two meals per day 3 Yes I eat few fruits and vegetables, or milk products 0 No I have three or more drinks of beer, liquor or wine almost every day 0 No I have tooth or mouth problems that make it hard for me to eat 0 No I don't always have enough money to buy the food I need 0 No I eat alone most of the time 0 No I take three or more different prescribed or over-the-counter drugs a day 0 No Without wanting to, I have lost or gained 10 pounds in the last six months 0 No I am not always physically able to shop, cook and/or feed myself 0 No Nutrition Protocols Good Risk Protocol Provide education on Moderate Risk Protocol 0 nutrition Electronic Signature(s) Signed: 04/07/2018 4:05:18 PM By: Roger Shelter Entered By: Roger Shelter on 04/07/2018 15:48:49

## 2018-04-09 NOTE — Progress Notes (Signed)
Cathy, Hines ([id number]) Visit Report for 04/07/2018 Allergy List Details Patient Name: Cathy Hines, Cathy Hines Date of Service: 04/07/2018 3:15 PM Medical Record Number: [id number] Patient Account Number: [id number] Date of Birth/Sex: 15-Sep-1946 (72 y.o. Female) Treating RN: Roger Shelter Primary Care Vrishank Moster: Emily Filbert Other Clinician: Referring Salinda Snedeker: Emily Filbert Treating Dalya Maselli/Extender: Ricard Dillon Weeks in Treatment: 0 Allergies Active Allergies sulfa ACE Inhibitors Augmentin gabapentin kiwi Namenda statins verapamil lactose Allergy Notes Electronic Signature(s) Signed: 04/07/2018 4:05:18 PM By: Roger Shelter Entered By: Roger Shelter on 04/07/2018 15:39:26 Cathy Hines ([id number]) -------------------------------------------------------------------------------- Arrival Information Details Patient Name: Cathy Hines Date of Service: 04/07/2018 3:15 PM Medical Record Number: [id number] Patient Account Number: [id number] Date of Birth/Sex: 12/21/1945 (72 y.o. Female) Treating RN: Roger Shelter Primary Care Romeo Zielinski: Emily Filbert Other Clinician: Referring Damontae Loppnow: Emily Filbert Treating Anniah Glick/Extender: Tito Dine in Treatment: 0 Visit Information Patient Arrived: Ambulatory Arrival Time: 15:31 Accompanied By: granddaughter- Crystal Transfer Assistance: None Patient Identification Verified: Yes Secondary Verification Process Yes Completed: Electronic Signature(s) Signed: 04/07/2018 4:05:18 PM By: Roger Shelter Entered By: Roger Shelter on 04/07/2018 15:31:41 Cathy Hines ([id number]) -------------------------------------------------------------------------------- Clinic Level of Care Assessment Details Patient Name: Cathy Hines Date of Service: 04/07/2018 3:15 PM Medical Record Number: [id number] Patient Account Number: [id number] Date of Birth/Sex: Aug 18, 1946 (72 y.o. Female) Treating RN: Cornell Barman Primary Care Mayia Megill: Emily Filbert Other Clinician: Referring Betti Goodenow: Emily Filbert Treating Magdelyn Roebuck/Extender: Tito Dine in Treatment: 0 Clinic Level of Care Assessment Items TOOL 1 Quantity Score []  - Use when EandM and Procedure is performed on INITIAL visit 0 ASSESSMENTS - Nursing Assessment / Reassessment []  - General Physical Exam (combine w/ comprehensive assessment (listed just below) when 0 performed on new pt. evals) X- 1 25 Comprehensive Assessment (HX, ROS, Risk Assessments, Wounds Hx, etc.) ASSESSMENTS - Wound and Skin Assessment / Reassessment []  - Dermatologic / Skin Assessment (not related to wound area) 0 ASSESSMENTS - Ostomy and/or Continence Assessment and Care []  - Incontinence Assessment and Management 0 []  - 0 Ostomy Care Assessment and Management (repouching, etc.) PROCESS - Coordination of Care X - Simple Patient / Family Education for ongoing care 1 15 []  - 0 Complex (extensive) Patient / Family Education for ongoing care []  - 0 Staff obtains Programmer, systems, Records, Test Results / Process Orders []  - 0 Staff telephones HHA, Nursing Homes / Clarify orders / etc []  - 0 Routine Transfer to another Facility (non-emergent condition) []  - 0 Routine Hospital Admission (non-emergent condition) X- 1 15 New Admissions / Biomedical engineer / Ordering NPWT, Apligraf, etc. []  - 0 Emergency Hospital Admission (emergent condition) PROCESS - Special Needs []  - Pediatric / Minor Patient Management 0 []  - 0 Isolation Patient Management []  - 0 Hearing / Language / Visual special needs []  - 0 Assessment of Community assistance (transportation, D/C planning, etc.) []  - 0 Additional assistance / Altered mentation []  - 0 Support Surface(s) Assessment (bed, cushion, seat, etc.) Przybylski, Renly L. ([id number]) INTERVENTIONS - Miscellaneous []  - External ear exam 0 []  - 0 Patient Transfer (multiple staff / Civil Service fast streamer / Similar devices) []  -  0 Simple Staple / Suture removal (25 or less) []  - 0 Complex Staple / Suture removal (26 or more) []  - 0 Hypo/Hyperglycemic Management (do not check if billed separately) []  - 0 Ankle / Brachial Index (ABI) - do not check if billed separately Has the patient been seen at the hospital within the last three years: Yes Total Score: 55 Level  Of Care: New/Established - Level 2 Electronic Signature(s) Signed: 04/07/2018 5:19:09 PM By: Gretta Cool, BSN, RN, CWS, Kim RN, BSN Entered By: Gretta Cool, BSN, RN, CWS, Kim on 04/07/2018 16:17:45 Cathy Hines ([id number]) -------------------------------------------------------------------------------- Encounter Discharge Information Details Patient Name: Cathy Hines Date of Service: 04/07/2018 3:15 PM Medical Record Number: [id number] Patient Account Number: [id number] Date of Birth/Sex: 10-05-1946 (72 y.o. Female) Treating RN: Montey Hora Primary Care Aksel Bencomo: Emily Filbert Other Clinician: Referring Kashari Chalmers: Emily Filbert Treating Maxson Oddo/Extender: Tito Dine in Treatment: 0 Encounter Discharge Information Items Discharge Condition: Stable Ambulatory Status: Ambulatory Discharge Destination: Home Transportation: Private Auto Accompanied By: granddaughter Schedule Follow-up Appointment: Yes Clinical Summary of Care: Electronic Signature(s) Signed: 04/07/2018 4:37:04 PM By: Montey Hora Entered By: Montey Hora on 04/07/2018 16:37:03 Cathy Hines ([id number]) -------------------------------------------------------------------------------- Lower Extremity Assessment Details Patient Name: Cathy Hines Date of Service: 04/07/2018 3:15 PM Medical Record Number: [id number] Patient Account Number: [id number] Date of Birth/Sex: October 31, 1946 (72 y.o. Female) Treating RN: Roger Shelter Primary Care Charese Abundis: Emily Filbert Other Clinician: Referring Antasia Haider: Emily Filbert Treating Armie Moren/Extender: Tito Dine  in Treatment: 0 Edema Assessment Assessed: [Left: No] [Right: No] Edema: [Left: Yes] [Right: Yes] Calf Left: Right: Point of Measurement: 30 cm From Medial Instep 37.4 cm 38.2 cm Ankle Left: Right: Point of Measurement: 8 cm From Medial Instep 20.6 cm 20.9 cm Vascular Assessment Claudication: Claudication Assessment [Left:None] [Right:Rest Pain] Pulses: Dorsalis Pedis Palpable: [Left:Yes] [Right:Yes] Posterior Tibial Extremity colors, hair growth, and conditions: Extremity Color: [Left:Normal] [Right:Normal] Hair Growth on Extremity: [Left:Yes] [Right:Yes] Temperature of Extremity: [Left:Warm] [Right:Warm] Capillary Refill: [Left:< 3 seconds] [Right:< 3 seconds] Electronic Signature(s) Signed: 04/07/2018 4:05:18 PM By: Roger Shelter Entered By: Roger Shelter on 04/07/2018 15:56:30 Cathy Hines ([id number]) -------------------------------------------------------------------------------- Multi Wound Chart Details Patient Name: Cathy Hines Date of Service: 04/07/2018 3:15 PM Medical Record Number: [id number] Patient Account Number: [id number] Date of Birth/Sex: 04-12-1946 (72 y.o. Female) Treating RN: Cornell Barman Primary Care Charliee Krenz: Emily Filbert Other Clinician: Referring Marveline Profeta: Emily Filbert Treating Gerrod Maule/Extender: Tito Dine in Treatment: 0 Vital Signs Height(in): 20 Pulse(bpm): 33 Weight(lbs): 163.4 Blood Pressure(mmHg): 152/49 Body Mass Index(BMI): 33 Temperature(F): 98.6 Respiratory Rate 18 (breaths/min): Photos: [1:No Photos] [N/A:N/A] Wound Location: [1:Right Lower Leg - Midline] [N/A:N/A] Wounding Event: [1:Trauma] [N/A:N/A] Primary Etiology: [1:Trauma, Other] [N/A:N/A] Comorbid History: [1:Cataracts, Chronic sinus problems/congestion, Lymphedema, Asthma, Sleep Apnea, Angina, Arrhythmia, Hypertension, Type II Diabetes, Osteoarthritis, Neuropathy] [N/A:N/A] Date Acquired: [1:03/07/2018] [N/A:N/A] Weeks of Treatment: [1:0]  [N/A:N/A] Wound Status: [1:Open] [N/A:N/A] Measurements L x W x D [1:5.6x1.8x0.3] [N/A:N/A] (cm) Area (cm) : [1:7.917] [N/A:N/A] Volume (cm) : [1:2.375] [N/A:N/A] % Reduction in Area: [1:0.00%] [N/A:N/A] % Reduction in Volume: [1:0.00%] [N/A:N/A] Classification: [1:Full Thickness Without Exposed Support Structures] [N/A:N/A] Exudate Amount: [1:None Present] [N/A:N/A] Wound Margin: [1:Distinct, outline attached] [N/A:N/A] Granulation Amount: [1:None Present (0%)] [N/A:N/A] Necrotic Amount: [1:Large (67-100%)] [N/A:N/A] Necrotic Tissue: [1:Eschar] [N/A:N/A] Exposed Structures: [1:Fat Layer (Subcutaneous Tissue) Exposed: Yes Fascia: No Tendon: No Muscle: No Joint: No Bone: No] [N/A:N/A] Epithelialization: [1:None] [N/A:N/A] Debridement: [1:Debridement - Excisional 16:13] [N/A:N/A N/A] Pre-procedure Verification/Time Out Taken: Pain Control: Other N/A N/A Tissue Debrided: Necrotic/Eschar, Fat, N/A N/A Subcutaneous, Slough Level: Skin/Subcutaneous Tissue N/A N/A Debridement Area (sq cm): 10.08 N/A N/A Instrument: Curette N/A N/A Bleeding: Moderate N/A N/A Hemostasis Achieved: Pressure N/A N/A Procedural Pain: 3 N/A N/A Post Procedural Pain: 3 N/A N/A Debridement Treatment Procedure was tolerated well N/A N/A Response: Post Debridement 5.6x1.8x0.6 N/A N/A Measurements L x W x D (cm) Post Debridement Volume:  4.75 N/A N/A (cm) Periwound Skin Texture: Excoriation: No N/A N/A Induration: No Callus: No Crepitus: No Rash: No Scarring: No Periwound Skin Moisture: Dry/Scaly: Yes N/A N/A Maceration: No Periwound Skin Color: Erythema: Yes N/A N/A Atrophie Blanche: No Cyanosis: No Ecchymosis: No Hemosiderin Staining: No Mottled: No Pallor: No Rubor: No Erythema Location: Circumferential N/A N/A Temperature: No Abnormality N/A N/A Tenderness on Palpation: Yes N/A N/A Wound Preparation: Ulcer Cleansing: N/A N/A Rinsed/Irrigated with Saline Topical Anesthetic  Applied: Other: lidocaine 4% Procedures Performed: Debridement N/A N/A Treatment Notes Electronic Signature(s) Signed: 04/07/2018 5:10:30 PM By: Linton Ham MD Entered By: Linton Ham on 04/07/2018 16:25:46 Cathy Hines ([id number]) -------------------------------------------------------------------------------- [place] Details Patient Name: Cathy Hines Date of Service: 04/07/2018 3:15 PM Medical Record Number: [id number] Patient Account Number: [id number] Date of Birth/Sex: Nov 13, 1946 (72 y.o. Female) Treating RN: Cornell Barman Primary Care Myangel Summons: Emily Filbert Other Clinician: Referring Damier Disano: Emily Filbert Treating Safwan Tomei/Extender: Tito Dine in Treatment: 0 Active Inactive ` Abuse / Safety / Falls / Self Care Management Nursing Diagnoses: History of Falls Goals: Patient will remain injury free related to falls Date Initiated: 04/07/2018 Target Resolution Date: 05/08/2018 Goal Status: Active Interventions: Assess self care needs on admission and as needed Notes: ` Nutrition Nursing Diagnoses: Impaired glucose control: actual or potential Goals: Patient/caregiver will maintain therapeutic glucose control Date Initiated: 04/07/2018 Target Resolution Date: 05/08/2018 Goal Status: Active Interventions: Assess HgA1c results as ordered upon admission and as needed Treatment Activities: Patient referred to Primary Care Physician for further nutritional evaluation : 04/07/2018 Notes: ` Orientation to the Wound Care Program Nursing Diagnoses: Knowledge deficit related to the wound healing center program Goals: Patient/caregiver will verbalize understanding of the [place] Date Initiated: 04/07/2018 Target Resolution Date: 05/08/2018 ELZORA, CULLINS ([id number]) Goal Status: Active Interventions: Provide education on orientation to the wound center Notes: ` Soft Tissue Infection Nursing  Diagnoses: Potential for infection: soft tissue Goals: Patient will remain free of wound infection Date Initiated: 04/07/2018 Target Resolution Date: 05/08/2018 Goal Status: Active Interventions: Assess signs and symptoms of infection every visit Notes: ` Wound/Skin Impairment Nursing Diagnoses: Impaired tissue integrity Goals: Ulcer/skin breakdown will have a volume reduction of 80% by week 12 Date Initiated: 04/07/2018 Target Resolution Date: 07/08/2018 Goal Status: Active Interventions: Assess ulceration(s) every visit Treatment Activities: Skin care regimen initiated : 04/07/2018 Topical wound management initiated : 04/07/2018 Notes: Electronic Signature(s) Signed: 04/07/2018 5:19:09 PM By: Gretta Cool, BSN, RN, CWS, Kim RN, BSN Entered By: Gretta Cool, BSN, RN, CWS, Kim on 04/07/2018 16:05:52 Cathy Hines ([id number]) -------------------------------------------------------------------------------- Pain Assessment Details Patient Name: Cathy Hines Date of Service: 04/07/2018 3:15 PM Medical Record Number: [id number] Patient Account Number: [id number] Date of Birth/Sex: [birth year] (72 y.o. Female) Treating RN: Roger Shelter Primary Care Deshara Rossi: Emily Filbert Other Clinician: Referring Ronalee Scheunemann: Emily Filbert Treating Carrel Leather/Extender: Tito Dine in Treatment: 0 Active Problems Location of Pain Severity and Description of Pain Patient Has Paino Yes Site Locations Pain Location: Pain in Ulcers Duration of the Pain. Constant / Intermittento Constant Rate the pain. Current Pain Level: 10 Character of Pain Describe the Pain: Burning, Sharp, Shooting Pain Management and Medication Current Pain Management: Notes percocet Electronic Signature(s) Signed: 04/07/2018 4:05:18 PM By: Roger Shelter Entered By: Roger Shelter on 04/07/2018 15:32:39 Cathy Hines  ([id number]) -------------------------------------------------------------------------------- Patient/Caregiver Education Details Patient Name: Cathy Hines Date of Service: 04/07/2018 3:15 PM Medical Record Number: [id number] Patient Account Number: [id number] Date of Birth/Gender: 1946/05/24 (72 y.o. Female) Treating  RN: Montey Hora Primary Care Physician: Emily Filbert Other Clinician: Referring Physician: Emily Filbert Treating Physician/Extender: Tito Dine in Treatment: 0 Education Assessment Education Provided To: Patient Education Topics Provided Venous: Handouts: Other: wrap precautions and leg elevation Methods: Explain/Verbal Responses: State content correctly Electronic Signature(s) Signed: 04/07/2018 4:55:05 PM By: Montey Hora Entered By: Montey Hora on 04/07/2018 Bolivar, Phelan ([id number]) -------------------------------------------------------------------------------- Wound Assessment Details Patient Name: Cathy Hines Date of Service: 04/07/2018 3:15 PM Medical Record Number: [id number] Patient Account Number: [id number] Date of Birth/Sex: 1946-06-09 (72 y.o. Female) Treating RN: Roger Shelter Primary Care Macgregor Aeschliman: Emily Filbert Other Clinician: Referring Juanice Warburton: Emily Filbert Treating Prisha Hiley/Extender: Tito Dine in Treatment: 0 Wound Status Wound Number: 1 Primary Trauma, Other Etiology: Wound Location: Right Lower Leg - Midline Wound Open Wounding Event: Trauma Status: Date Acquired: 03/07/2018 Comorbid Cataracts, Chronic sinus problems/congestion, Weeks Of Treatment: 0 History: Lymphedema, Asthma, Sleep Apnea, Angina, Clustered Wound: No Arrhythmia, Hypertension, Type II Diabetes, Osteoarthritis, Neuropathy Photos Photo Uploaded By: Roger Shelter on 04/07/2018 16:34:16 Wound Measurements Length: (cm) 5.6 Width: (cm) 1.8 Depth: (cm) 0.3 Area: (cm) 7.917 Volume: (cm) 2.375 % Reduction  in Area: 0% % Reduction in Volume: 0% Epithelialization: None Tunneling: No Undermining: No Wound Description Full Thickness Without Exposed Support Classification: Structures Wound Margin: Distinct, outline attached Exudate None Present Amount: Foul Odor After Cleansing: No Slough/Fibrino Yes Wound Bed Granulation Amount: None Present (0%) Exposed Structure Necrotic Amount: Large (67-100%) Fascia Exposed: No Necrotic Quality: Eschar Fat Layer (Subcutaneous Tissue) Exposed: Yes Tendon Exposed: No Muscle Exposed: No Joint Exposed: No Bone Exposed: No Baez, Rusty L. ([id number]) Periwound Skin Texture Texture Color No Abnormalities Noted: No No Abnormalities Noted: No Callus: No Atrophie Blanche: No Crepitus: No Cyanosis: No Excoriation: No Ecchymosis: No Induration: No Erythema: Yes Rash: No Erythema Location: Circumferential Scarring: No Hemosiderin Staining: No Mottled: No Moisture Pallor: No No Abnormalities Noted: No Rubor: No Dry / Scaly: Yes Maceration: No Temperature / Pain Temperature: No Abnormality Tenderness on Palpation: Yes Wound Preparation Ulcer Cleansing: Rinsed/Irrigated with Saline Topical Anesthetic Applied: Other: lidocaine 4%, Treatment Notes Wound #1 (Right, Midline Lower Leg) 1. Cleansed with: Clean wound with Normal Saline 2. Anesthetic Topical Lidocaine 4% cream to wound bed prior to debridement Hurricaine Topical Anesthetic Spray 4. Dressing Applied: Iodoflex 5. Secondary Dressing Applied ABD Pad 7. Secured with Other (specify in notes) Notes xtrasorb, kerlix/coban wrap Electronic Signature(s) Signed: 04/07/2018 4:05:18 PM By: Roger Shelter Entered By: Roger Shelter on 04/07/2018 15:51:47 Cathy Hines ([id number]) -------------------------------------------------------------------------------- [place] Details Patient Name: Cathy Hines Date of Service: 04/07/2018 3:15 PM Medical Record Number:  [id number] Patient Account Number: [id number] Date of Birth/Sex: [birth year] (72 y.o. Female) Treating RN: Roger Shelter Primary Care Jaria Conway: Emily Filbert Other Clinician: Referring Adayah Arocho: Emily Filbert Treating Bauer Ausborn/Extender: Tito Dine in Treatment: 0 Vital Signs Time Taken: 15:32 Temperature (F): 98.6 Height (in): 59 Pulse (bpm): 72 Source: Stated Respiratory Rate (breaths/min): 18 Weight (lbs): 163.4 Blood Pressure (mmHg): 152/49 Source: Measured Reference Range: 80 - 120 mg / dl Body Mass Index (BMI): 33 Electronic Signature(s) Signed: 04/07/2018 4:05:18 PM By: Roger Shelter Entered By: Roger Shelter on 04/07/2018 15:33:11

## 2018-04-09 NOTE — Progress Notes (Signed)
HASSIE, MANDT ([id number]) Visit Report for 04/07/2018 Chief Complaint Document Details Patient Name: Cathy Hines, Cathy Hines Date of Service: 04/07/2018 3:15 PM Medical Record Number: [id number] Patient Account Number: [id number] Date of Birth/Sex: December 25, 1945 (72 y.o. Female) Treating RN: Cornell Barman Primary Care Provider: Emily Filbert Other Clinician: Referring Provider: Emily Filbert Treating Provider/Extender: Tito Dine in Treatment: 0 Information Obtained from: Patient Chief Complaint 04/07/18; patient is here for a laceration type injury on the right anterior shin for 1 month Electronic Signature(s) Signed: 04/07/2018 5:10:30 PM By: Linton Ham MD Entered By: Linton Ham on 04/07/2018 16:26:36 Cathy Hines ([id number]) -------------------------------------------------------------------------------- Debridement Details Patient Name: Cathy Hines Date of Service: 04/07/2018 3:15 PM Medical Record Number: [id number] Patient Account Number: [id number] Date of Birth/Sex: 1946-06-05 (72 y.o. Female) Treating RN: Cornell Barman Primary Care Provider: Emily Filbert Other Clinician: Referring Provider: Emily Filbert Treating Provider/Extender: Tito Dine in Treatment: 0 Debridement Performed for Wound #1 Right,Midline Lower Leg Assessment: Performed By: Physician Ricard Dillon, MD Debridement Type: Debridement Pre-procedure Verification/Time Yes - 16:13 Out Taken: Start Time: 16:15 Pain Control: Other : lidocaine 4% Total Area Debrided (L x W): 5.6 (cm) x 1.8 (cm) = 10.08 (cm) Tissue and other material Viable, Non-Viable, Eschar, Fat, Slough, Subcutaneous, Skin: Dermis , Slough debrided: Level: Skin/Subcutaneous Tissue Debridement Description: Excisional Instrument: Curette Bleeding: Moderate Hemostasis Achieved: Pressure End Time: 16:15 Procedural Pain: 3 Post Procedural Pain: 3 Response to Treatment: Procedure was tolerated well Level  of Consciousness: Awake and Alert Post Procedure Vitals: Temperature: 98.6 Pulse: 72 Respiratory Rate: 16 Blood Pressure: Systolic Blood Pressure: 716 Diastolic Blood Pressure: 49 Post Debridement Measurements of Total Wound Length: (cm) 5.6 Width: (cm) 1.8 Depth: (cm) 0.6 Volume: (cm) 4.75 Character of Wound/Ulcer Post Debridement: Stable Post Procedure Diagnosis Same as Pre-procedure Electronic Signature(s) Signed: 04/07/2018 5:10:30 PM By: Linton Ham MD Signed: 04/07/2018 5:19:09 PM By: Gretta Cool, BSN, RN, CWS, Kim RN, BSN Entered By: Linton Ham on 04/07/2018 16:26:06 Cathy Hines ([id number]) -------------------------------------------------------------------------------- HPI Details Patient Name: Cathy Hines Date of Service: 04/07/2018 3:15 PM Medical Record Number: [id number] Patient Account Number: [id number] Date of Birth/Sex: 1946-10-21 (72 y.o. Female) Treating RN: Cornell Barman Primary Care Provider: Emily Filbert Other Clinician: Referring Provider: Emily Filbert Treating Provider/Extender: Tito Dine in Treatment: 0 History of Present Illness HPI Description: 04/07/18 ADMISSION This is a 72 year old woman with type 2 diabetes and a history of diabetic neuropathy. About a month ago she fell while walking in her yard and suffered a laceration of her right anterior shin over the tibia. She tried to re-adhere the skin flaps. She went to see her primary physician on 03/11/18 was given antibiotic ointment, Telfa and Coban and was prescribed doxycycline. I believe she had an x-ray ordered that did not show up particular abnormality. On 03/15/18 Keflex was added. Most recently she has been putting Betadine on this and Vaseline and was told to soak this in Epson salts. She does not have a wound history. She has type 2 diabetes with neuropathy, hyperlipidemia hypertension hypothyroidism mitral valve prolapse and atrial fibrillation, osteoarthritis and  asthma. We could not test her ABIs on either side because of complaints about pain, even the left side doesn't have a wound Electronic Signature(s) Signed: 04/07/2018 5:10:30 PM By: Linton Ham MD Entered By: Linton Ham on 04/07/2018 16:29:10 Cathy Hines ([id number]) -------------------------------------------------------------------------------- Physical Exam Details Patient Name: Cathy Hines Date of Service: 04/07/2018 3:15 PM Medical Record Number: [id number] Patient Account Number:  [id number] Date of Birth/Sex: 1946-01-27 (72 y.o. Female) Treating RN: Cornell Barman Primary Care Provider: Emily Filbert Other Clinician: Referring Provider: Emily Filbert Treating Provider/Extender: Tito Dine in Treatment: 0 Constitutional Patient is hypertensive.. Pulse regular and within target range for patient.Marland Kitchen Respirations regular, non-labored and within target range.. Temperature is normal and within the target range for the patient.Marland Kitchen appears in no distress. Eyes Conjunctivae clear. No discharge. Respiratory Respiratory effort is easy and symmetric bilaterally. Rate is normal at rest and on room air.. Bilateral breath sounds are clear and equal in all lobes with no wheezes, rales or rhonchi.. Cardiovascular Heart rhythm and rate regular, without murmur or gallop.. palpable on the right. easily palpable dorsalis pedis, posterior tibial pulse on the right. Her forefoot is warm. there is no major edema or evidence of venous insufficiency. Lymphatic none palpable in the right popliteal or inguinal area. Musculoskeletal she has a small nodule at the base of her right second refused to have this removed toe which is being followed by podiatry. She apparently. Integumentary (Hair, Skin) no systemic rashes seen. Psychiatric No evidence of depression, anxiety, or agitation. Calm, cooperative, and communicative. Appropriate interactions and affect.. Notes 04/07/18; fairly  extensive wound on the right anterior tibial area. Linear laceration wound covered in tightly adherent thick necrotic surface. After some discussion she allowed me to go ahead and debride this using a #5 curette. We removed adherent surface eschar and subcutaneous tissue to get a reasonably healthy-looking wound bed. She has small open area towards the superior aspect of this vertical wound that has more depth. No obvious exposed tendon. There is no evidence currently of surrounding infection. No culture was done. No antibiotics were felt to be indicated Electronic Signature(s) Signed: 04/07/2018 5:10:30 PM By: Linton Ham MD Entered By: Linton Ham on 04/07/2018 16:32:13 Cathy Hines ([id number]) -------------------------------------------------------------------------------- Physician Orders Details Patient Name: Cathy Hines Date of Service: 04/07/2018 3:15 PM Medical Record Number: [id number] Patient Account Number: [id number] Date of Birth/Sex: 08-16-1946 (72 y.o. Female) Treating RN: Cornell Barman Primary Care Provider: Emily Filbert Other Clinician: Referring Provider: Emily Filbert Treating Provider/Extender: Tito Dine in Treatment: 0 Verbal / Phone Orders: No Diagnosis Coding Wound Cleansing Wound #1 Right,Midline Lower Leg o Clean wound with Normal Saline. Anesthetic (add to Medication List) Wound #1 Right,Midline Lower Leg o Topical Lidocaine 4% cream applied to wound bed prior to debridement (In Clinic Only). o Benzocaine Topical Anesthetic Spray applied to wound bed prior to debridement (In Clinic Only). Primary Wound Dressing Wound #1 Right,Midline Lower Leg o Iodoflex Secondary Dressing o Kerlix and Coban Wound #1 Right,Midline Lower Leg o ABD pad o XtraSorb Dressing Change Frequency Wound #1 Right,Midline Lower Leg o Change dressing every week Follow-up Appointments Wound #1 Right,Midline Lower Leg o Return Appointment  in 1 week. o Nurse Visit as needed Edema Control o Elevate legs to the level of the heart and pump ankles as often as possible Electronic Signature(s) Signed: 04/07/2018 5:10:30 PM By: Linton Ham MD Signed: 04/07/2018 5:19:09 PM By: Gretta Cool, BSN, RN, CWS, Kim RN, BSN Entered By: Gretta Cool, BSN, RN, CWS, Kim on 04/07/2018 16:16:55 Cathy Hines, Cathy Hines ([id number]) -------------------------------------------------------------------------------- Problem List Details Patient Name: Cathy Hines Date of Service: 04/07/2018 3:15 PM Medical Record Number: [id number] Patient Account Number: [id number] Date of Birth/Sex: [birth year] (72 y.o. 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 of 04/07/2018 Yes muscle E11.622 Type 2 diabetes mellitus with other skin ulcer 04/07/2018 Yes E11.40 Type 2 diabetes mellitus with diabetic neuropathy, 04/07/2018 Yes unspecified Inactive Problems Resolved Problems Electronic Signature(s) Signed: 04/07/2018 5:10:30 PM By: Linton Ham MD Entered By: Linton Ham on 04/07/2018 16:25:33 Cathy Hines ([id number]) -------------------------------------------------------------------------------- Progress Note Details Patient Name: Cathy Hines Date of Service: 04/07/2018 3:15 PM Medical Record Number: [id number] Patient Account Number: [id number] Date of Birth/Sex: 18-Mar-1946 (72 y.o. Female) Treating RN: Cornell Barman Primary Care Provider: Emily Filbert Other Clinician: Referring Provider: Emily Filbert Treating Provider/Extender: Tito Dine in  Treatment: 0 Subjective Chief Complaint Information obtained from Patient 04/07/18; patient is here for a laceration type injury on the right anterior shin for 1 month History of Present Illness (HPI) 04/07/18 ADMISSION This is a 72 year old woman with type 2 diabetes and a history of diabetic neuropathy. About a month ago she fell while walking in her yard and suffered a laceration of her right anterior shin over the tibia. She tried to re-adhere the skin flaps. She went to see her primary physician on 03/11/18 was given antibiotic ointment, Telfa and Coban and was prescribed doxycycline. I believe she had an x-ray ordered that did not show up particular abnormality. On 03/15/18 Keflex was added. Most recently she has been putting Betadine on this and Vaseline and was told to soak this in Epson salts. She does not have a wound history. She has type 2 diabetes with neuropathy, hyperlipidemia hypertension hypothyroidism mitral valve prolapse and atrial fibrillation, osteoarthritis and asthma. We could not test her ABIs on either side because of complaints about pain, even the left side doesn't have a wound Wound History Patient presents with 1 open wound that has been present for approximately one month. Patient has been treating wound in the following manner: epsom salt and vaseline. Laboratory tests have been performed in the last month. Patient reportedly has not tested positive for an antibiotic resistant organism. Patient reportedly has not tested positive for osteomyelitis. Patient reportedly has not had testing performed to evaluate circulation in the legs. Patient History Allergies sulfa, ACE Inhibitors, Augmentin, gabapentin, kiwi, Namenda, statins, verapamil, lactose Social History Former smoker - quit in 1980, Alcohol Use - Never, Drug Use - No History, Caffeine Use - Daily. Medical History Eyes Patient has history of Cataracts - bilateral Denies history of Glaucoma, Optic  Neuritis Ear/Nose/Mouth/Throat Patient has history of Chronic sinus problems/congestion Denies history of Middle ear problems Hematologic/Lymphatic Patient has history of Lymphedema Denies history of Anemia, Hemophilia, Human Immunodeficiency Virus, Sickle Cell Disease Respiratory Patient has history of Asthma, Sleep Apnea Denies history of Aspiration, Chronic Obstructive Pulmonary Disease (COPD), Pneumothorax, Tuberculosis Cathy Hines, Cathy L. ([id number]) Cardiovascular Patient has history of Angina, Arrhythmia - afib and micro valve prolapse, Hypertension Denies history of Congestive Heart Failure, Coronary Artery Disease, Deep Vein Thrombosis, Hypotension, Myocardial Infarction, Peripheral Arterial Disease, Peripheral Venous Disease, Phlebitis, Vasculitis Gastrointestinal Denies history of Cirrhosis , Colitis, Crohn s, Hepatitis A, Hepatitis B, Hepatitis C Endocrine Patient has history of Type II Diabetes Denies history of Type I Diabetes Genitourinary Denies history of End Stage Renal Disease Immunological Denies history of Lupus Erythematosus, Raynaud s, Scleroderma Integumentary (Skin) Denies history of History of Burn, History of pressure wounds Musculoskeletal Patient has history of Osteoarthritis Denies history of Gout, Rheumatoid Arthritis,  Osteomyelitis Neurologic Patient has history of Neuropathy Denies history of Dementia, Quadriplegia, Paraplegia, Seizure Disorder Psychiatric Denies history of Anorexia/bulimia, Confinement Anxiety Patient is treated with Oral Agents. Blood sugar is not tested. Review of Systems (ROS) Constitutional Symptoms (General Health) Denies complaints or symptoms of Fatigue, Fever, Chills, Marked Weight Change. Eyes Complains or has symptoms of Dry Eyes, Glasses / Contacts - glasses. Ear/Nose/Mouth/Throat Complains or has symptoms of Sinusitis. Denies complaints or symptoms of Difficult clearing ears. Hematologic/Lymphatic Denies  complaints or symptoms of Bleeding / Clotting Disorders, Human Immunodeficiency Virus. Respiratory Complains or has symptoms of Shortness of Breath - exertion. Cardiovascular Complains or has symptoms of LE edema. Denies complaints or symptoms of Chest pain. Gastrointestinal Denies complaints or symptoms of Frequent diarrhea, Nausea, Vomiting. Endocrine Complains or has symptoms of Thyroid disease. Denies complaints or symptoms of Hepatitis, Polydypsia (Excessive Thirst). Genitourinary Complains or has symptoms of Incontinence/dribbling. Denies complaints or symptoms of Kidney failure/ Dialysis. Integumentary (Skin) Complains or has symptoms of Wounds - 1, Bleeding or bruising tendency. Denies complaints or symptoms of Breakdown, Swelling. Musculoskeletal Denies complaints or symptoms of Muscle Pain, Muscle Weakness. Neurologic Denies complaints or symptoms of Numbness/parasthesias, Focal/Weakness. Oncologic The patient has no complaints or symptoms. Psychiatric Denies complaints or symptoms of Anxiety, Claustrophobia. Cathy Hines, Cathy Hines ([id number]) Objective Constitutional Patient is hypertensive.. Pulse regular and within target range for patient.Marland Kitchen Respirations regular, non-labored and within target range.. Temperature is normal and within the target range for the patient.Marland Kitchen appears in no distress. Vitals Time Taken: 3:32 PM, Height: 59 in, Source: Stated, Weight: 163.4 lbs, Source: Measured, BMI: 33, Temperature: 98.6 F, Pulse: 72 bpm, Respiratory Rate: 18 breaths/min, Blood Pressure: 152/49 mmHg. Eyes Conjunctivae clear. No discharge. Respiratory Respiratory effort is easy and symmetric bilaterally. Rate is normal at rest and on room air.. Bilateral breath sounds are clear and equal in all lobes with no wheezes, rales or rhonchi.. Cardiovascular Heart rhythm and rate regular, without murmur or gallop.. palpable on the right. easily palpable dorsalis pedis, posterior  tibial pulse on the right. Her forefoot is warm. there is no major edema or evidence of venous insufficiency. Lymphatic none palpable in the right popliteal or inguinal area. Musculoskeletal she has a small nodule at the base of her right second refused to have this removed toe which is being followed by podiatry. She apparently. Psychiatric No evidence of depression, anxiety, or agitation. Calm, cooperative, and communicative. Appropriate interactions and affect.. General Notes: 04/07/18; fairly extensive wound on the right anterior tibial area. Linear laceration wound covered in tightly adherent thick necrotic surface. After some discussion she allowed me to go ahead and debride this using a #5 curette. We removed adherent surface eschar and subcutaneous tissue to get a reasonably healthy-looking wound bed. She has small open area towards the superior aspect of this vertical wound that has more depth. No obvious exposed tendon. There is no evidence currently of surrounding infection. No culture was done. No antibiotics were felt to be indicated Integumentary (Hair, Skin) no systemic rashes seen. Wound #1 status is Open. Original cause of wound was Trauma. The wound is located on the Right,Midline Lower Leg. The wound measures 5.6cm length x 1.8cm width x 0.3cm depth; 7.917cm^2 area and 2.375cm^3 volume. There is Fat Layer (Subcutaneous Tissue) Exposed exposed. There is no tunneling or undermining noted. There is a none present amount of drainage noted. The wound margin is distinct with the outline attached to the wound base. There is no granulation within the wound bed. There is  a large (67-100%) amount of necrotic tissue within the wound bed including Eschar. The periwound skin appearance exhibited: Dry/Scaly, Erythema. The periwound skin appearance did not exhibit: Callus, Crepitus, Excoriation, Induration, Rash, Scarring, Maceration, Atrophie Blanche, Cyanosis, Ecchymosis, Hemosiderin  Staining, Mottled, Pallor, Rubor. The surrounding wound skin color is noted with erythema which is circumferential. Periwound temperature was noted Cathy Hines, Cathy L. ([id number]) as No Abnormality. The periwound has tenderness on palpation. 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 There was a Excisional Skin/Subcutaneous Tissue Debridement with a total area of 10.08 sq cm performed by Ricard Dillon, MD. With the following instrument(s): Curette to remove Viable and Non-Viable tissue/material. Material removed includes Fat, Eschar, Subcutaneous Tissue, Slough, and Skin: Dermis after achieving pain control using Other (lidocaine 4%). No specimens were taken. A time out was conducted at 16:13, prior to the start of the procedure. A Moderate amount of bleeding was controlled with Pressure. The procedure was tolerated well with a pain level of 3 throughout and a pain level of 3 following the procedure. Patient s Level of Consciousness post procedure was recorded as Awake and Alert and post-procedure vitals were taken including Temperature: 98.6 F, Pulse: 72 bpm, Respiratory Rate: 16 breaths/min, Blood Pressure: (152)/(49) mmHg. Post Debridement Measurements: 5.6cm length x 1.8cm width x 0.6cm depth; 4.75cm^3 volume. Character of Wound/Ulcer Post Debridement is stable. Post procedure Diagnosis Wound #1: Same as Pre-Procedure Plan Wound Cleansing: Wound #1 Right,Midline Lower Leg: Clean wound with Normal Saline. Anesthetic (add to Medication List): Wound #1 Right,Midline Lower Leg: Topical Lidocaine 4%  cream applied to wound bed prior to debridement (In Clinic Only). Benzocaine Topical Anesthetic Spray applied to wound bed prior to debridement (In Clinic Only). Primary Wound Dressing: Wound #1 Right,Midline Lower Leg: Iodoflex Secondary Dressing: Kerlix and Coban Wound #1 Right,Midline Lower Leg: ABD pad XtraSorb Dressing Change Frequency: Cathy Hines, Cathy Hines. ([id number]) Wound #1 Right,Midline Lower Leg: Change dressing every week Follow-up Appointments: Wound #1 Right,Midline Lower Leg: Return Appointment in 1 week. Nurse Visit as needed Edema Control: Elevate legs to the level of the heart and pump ankles as often as possible #1 I'm going to put Iodoflex on this wound to further help prepare the surface and put this under compression with Kerlix Coban. ABDs and extrasorb over the Iodoflex. we'll try to keep this on all week #2 we could not do ABIs in this clinic however her clinical exam suggests adequate arterial flow. #3 any concurrent infection here was successfully treated by her primary physician Dr. Sabra Heck. No cultures were necessary I did not add any additional antibiotics. #4 this type of injury can sometimes aggravate resting diabetic neuropathy. I think that's what she is describing here. Electronic Signature(s) Signed: 04/07/2018 5:10:30 PM By: Linton Ham MD Entered By: Linton Ham on 04/07/2018 16:34:35 Cathy Hines ([id number]) -------------------------------------------------------------------------------- ROS/PFSH Details Patient Name: Cathy Hines Date of Service: 04/07/2018 3:15 PM Medical Record Number: [id number] Patient Account Number: [id number] Date of Birth/Sex: [birth year] (72 y.o. Female) Treating RN: Roger Shelter Primary Care Provider: Emily Filbert Other Clinician: Referring Provider: Emily Filbert Treating Provider/Extender: Tito Dine in Treatment: 0 Wound History Do you currently have  one or more open woundso  Yes How many open wounds do you currently haveo 1 Approximately how long have you had your woundso one month How have you been treating your wound(s) until nowo epsom salt and vaseline Has your wound(s) ever healed and then re-openedo No Have you had any lab work done in the past montho Yes Have you tested positive for an antibiotic resistant organism (MRSA, VRE)o No Have you tested positive for osteomyelitis (bone infection)o No Have you had any tests for circulation on your legso No Constitutional Symptoms (General Health) Complaints and Symptoms: Negative for: Fatigue; Fever; Chills; Marked Weight Change Eyes Complaints and Symptoms: Positive for: Dry Eyes; Glasses / Contacts - glasses Medical History: Positive for: Cataracts - bilateral Negative for: Glaucoma; Optic Neuritis Ear/Nose/Mouth/Throat Complaints and Symptoms: Positive for: Sinusitis Negative for: Difficult clearing ears Medical History: Positive for: Chronic sinus problems/congestion Negative for: Middle ear problems Hematologic/Lymphatic Complaints and Symptoms: Negative for: Bleeding / Clotting Disorders; Human Immunodeficiency Virus Medical History: Positive for: Lymphedema Negative for: Anemia; Hemophilia; Human Immunodeficiency Virus; Sickle Cell Disease Respiratory Complaints and Symptoms: Positive for: Shortness of Breath - exertion Medical History: Cathy Hines, Cathy Hines. ([id number]) Positive for: Asthma; Sleep Apnea Negative for: Aspiration; Chronic Obstructive Pulmonary Disease (COPD); Pneumothorax; Tuberculosis Cardiovascular Complaints and Symptoms: Positive for: LE edema Negative for: Chest pain Medical History: Positive for: Angina; Arrhythmia - afib and micro valve prolapse; Hypertension Negative for: Congestive Heart Failure; Coronary Artery Disease; Deep Vein Thrombosis; Hypotension; Myocardial Infarction; Peripheral Arterial Disease; Peripheral Venous Disease; Phlebitis;  Vasculitis Gastrointestinal Complaints and Symptoms: Negative for: Frequent diarrhea; Nausea; Vomiting Medical History: Negative for: Cirrhosis ; Colitis; Crohnos; Hepatitis A; Hepatitis B; Hepatitis C Endocrine Complaints and Symptoms: Positive for: Thyroid disease Negative for: Hepatitis; Polydypsia (Excessive Thirst) Medical History: Positive for: Type II Diabetes Negative for: Type I Diabetes Time with diabetes: 10 years Treated with: Oral agents Blood sugar tested every day: No Genitourinary Complaints and Symptoms: Positive for: Incontinence/dribbling Negative for: Kidney failure/ Dialysis Medical History: Negative for: End Stage Renal Disease Integumentary (Skin) Complaints and Symptoms: Positive for: Wounds - 1; Bleeding or bruising tendency Negative for: Breakdown; Swelling Medical History: Negative for: History of Burn; History of pressure wounds Musculoskeletal Complaints and Symptoms: Negative for: Muscle Pain; Muscle Weakness Medical History: ABI, SHOULTS. ([id number]) Positive for: Osteoarthritis Negative for: Gout; Rheumatoid Arthritis; Osteomyelitis Neurologic Complaints and Symptoms: Negative for: Numbness/parasthesias; Focal/Weakness Medical History: Positive for: Neuropathy Negative for: Dementia; Quadriplegia; Paraplegia; Seizure Disorder Psychiatric Complaints and Symptoms: Negative for: Anxiety; Claustrophobia Medical History: Negative for: Anorexia/bulimia; Confinement Anxiety Immunological Medical History: Negative for: Lupus Erythematosus; Raynaudos; Scleroderma Oncologic Complaints and Symptoms: No Complaints or Symptoms HBO Extended History Items Eyes: Ear/Nose/Mouth/Throat: Cataracts Chronic sinus problems/congestion Immunizations Pneumococcal Vaccine: Received Pneumococcal Vaccination: No Tetanus Vaccine: Last tetanus shot: 03/07/2018 Implantable Devices Family and Social History Former smoker - quit in 1980; Alcohol Use:  Never; Drug Use: No History; Caffeine Use: Daily; Financial Concerns: No; Food, Clothing or Shelter Needs: No; Support System Lacking: No; Transportation Concerns: No; Advanced Directives: No; Patient does not want information on Advanced Directives; Do not resuscitate: No; Living Will: No; Medical Power of Attorney: No Electronic Signature(s) Signed: 04/07/2018 4:05:18 PM By: Roger Shelter Signed: 04/07/2018 5:10:30 PM By: Linton Ham MD Entered By: Roger Shelter on 04/07/2018 15:46:18 Cathy Hines ([id number]) -------------------------------------------------------------------------------- [place] Details Patient Name: Cathy Hines Date of Service: 04/07/2018 Medical Record Number: [id number] Patient Account Number: [id number] Date of Birth/Sex: 1946-06-03 (72 y.o. Female) Treating RN: Cornell Barman Primary  Care Provider: Emily Filbert Other Clinician: Referring Provider: Emily Filbert Treating Provider/Extender: Tito Dine in Treatment: 0 Diagnosis Coding ICD-10 Codes Code Description [phone] Laceration without foreign body, right lower leg, subsequent encounter L97.213 Non-pressure chronic ulcer of right calf with necrosis of muscle E11.622 Type 2 diabetes mellitus with other skin ulcer E11.40 Type 2 diabetes mellitus with diabetic neuropathy, unspecified Facility Procedures CPT4 Code: [id number] Description: [phone] - WOUND CARE VISIT-LEV 2 EST PT Modifier: Quantity: 1 CPT4 Code: 51102111 Description: 73567 - DEB SUBQ TISSUE 20 SQ CM/< ICD-10 Diagnosis Description L97.213 Non-pressure chronic ulcer of right calf with necrosis of mu Modifier: scle Quantity: 1 Physician Procedures CPT4 Code: [id number] Description: WC PHYS LEVEL 3 o NEW PT ICD-10 Diagnosis Description S81.811D Laceration without foreign body, right lower leg, subsequent L97.213 Non-pressure chronic ulcer of right calf with necrosis of mus E11.40 Type 2 diabetes mellitus with diabetic   neuropathy, unspecifie Modifier: 25 encounter cle d Quantity: 1 CPT4 Code: 1314388 Description: 87579 - WC PHYS SUBQ TISS 20 SQ CM ICD-10 Diagnosis Description J28.206 Non-pressure chronic ulcer of right calf with necrosis of mus Modifier: cle Quantity: 1 Electronic Signature(s) Signed: 04/07/2018 5:10:30 PM By: Linton Ham MD Entered By: Linton Ham on 04/07/2018 16:35:23

## 2018-04-14 ENCOUNTER — Encounter: Payer: Medicare HMO | Admitting: Internal Medicine

## 2018-04-14 DIAGNOSIS — E1140 Type 2 diabetes mellitus with diabetic neuropathy, unspecified: Secondary | ICD-10-CM | POA: Diagnosis not present

## 2018-04-14 DIAGNOSIS — I872 Venous insufficiency (chronic) (peripheral): Secondary | ICD-10-CM | POA: Diagnosis not present

## 2018-04-14 DIAGNOSIS — L97213 Non-pressure chronic ulcer of right calf with necrosis of muscle: Secondary | ICD-10-CM | POA: Diagnosis not present

## 2018-04-14 DIAGNOSIS — E785 Hyperlipidemia, unspecified: Secondary | ICD-10-CM | POA: Diagnosis not present

## 2018-04-14 DIAGNOSIS — E039 Hypothyroidism, unspecified: Secondary | ICD-10-CM | POA: Diagnosis not present

## 2018-04-14 DIAGNOSIS — I10 Essential (primary) hypertension: Secondary | ICD-10-CM | POA: Diagnosis not present

## 2018-04-14 DIAGNOSIS — S81811A Laceration without foreign body, right lower leg, initial encounter: Secondary | ICD-10-CM | POA: Diagnosis not present

## 2018-04-14 DIAGNOSIS — E1151 Type 2 diabetes mellitus with diabetic peripheral angiopathy without gangrene: Secondary | ICD-10-CM | POA: Diagnosis not present

## 2018-04-14 DIAGNOSIS — I341 Nonrheumatic mitral (valve) prolapse: Secondary | ICD-10-CM | POA: Diagnosis not present

## 2018-04-14 DIAGNOSIS — E11622 Type 2 diabetes mellitus with other skin ulcer: Secondary | ICD-10-CM | POA: Diagnosis not present

## 2018-04-16 NOTE — Progress Notes (Signed)
Cathy, Hines ([id number]) Visit Report for 04/14/2018 HPI Details Patient Name: Cathy Hines, Cathy Hines Date of Service: 04/14/2018 2:45 PM Medical Record Number: [id number] Patient Account Number: [id number] Date of Birth/Sex: 06/13/1946 (72 y.o. F) Treating RN: Cornell Barman Primary Care Provider: Emily Filbert Other Clinician: Referring Provider: Emily Filbert Treating Provider/Extender: Tito Dine in Treatment: 1 History of Present Illness HPI Description: 04/07/18 ADMISSION This is a 72 year old woman with type 2 diabetes and a history of diabetic neuropathy. About a month ago she fell while walking in her yard and suffered a laceration of her right anterior shin over the tibia. She tried to re-adhere the skin flaps. She went to see her primary physician on 03/11/18 was given antibiotic ointment, Telfa and Coban and was prescribed doxycycline. I believe she had an x-ray ordered that did not show up particular abnormality. On 03/15/18 Keflex was added. Most recently she has been putting Betadine on this and Vaseline and was told to soak this in Epson salts. She does not have a wound history. She has type 2 diabetes with neuropathy, hyperlipidemia hypertension hypothyroidism mitral valve prolapse and atrial fibrillation, osteoarthritis and asthma. We could not test her ABIs on either side because of complaints about pain, even the left side doesn't have a wound 04/14/18; she comes in today telling us that the debridement I did last week caused her to vomit when she got home. We are using Iodoflex to the wound as the primary dressing under Kerlix and Coban. She also had complaints about the wrap the heat etc. Electronic Signature(s) Signed: 04/15/2018 8:24:56 AM By: Linton Ham MD Entered By: Linton Ham on 04/14/2018 16:19:45 Cathy Hines ([id number]) -------------------------------------------------------------------------------- Physical Exam Details Patient Name:  Cathy Hines Date of Service: 04/14/2018 2:45 PM Medical Record Number: [id number] Patient Account Number: [id number] Date of Birth/Sex: 05/02/1946 (72 y.o. F) Treating RN: Cornell Barman Primary Care Provider: Emily Filbert Other Clinician: Referring Provider: Emily Filbert Treating Provider/Extender: Tito Dine in Treatment: 1 Constitutional Patient is hypertensive.. Pulse regular and within target range for patient.. Temperature is normal and within the target range for the patient.Marland Kitchen appears in no distress. Respiratory Respiratory effort is easy and symmetric bilaterally. Rate is normal at rest and on room air.. Cardiovascular Pedal pulses palpable and strong bilaterally.. minimal edema in both legs. Lymphatic nonpalpable in the popliteal or inguinal area. Integumentary (Hair, Skin) changes of chronic venous insufficiency in both lower extremities. Minimal edema. Psychiatric No evidence of depression, anxiety, or agitation. Calm, cooperative, and communicative. Appropriate interactions and affect.. Notes wound exam; fairly extensive linear wound in the right anterior tibia. About 30% of this has reasonable looking granulation the rest of it the same necrotic debris that I removed last week. Nevertheless I did not push debridement on her this week. There is no evidence of surrounding infection Electronic Signature(s) Signed: 04/15/2018 8:24:56 AM By: Linton Ham MD Entered By: Linton Ham on 04/14/2018 [place], [place] ([id number]) -------------------------------------------------------------------------------- Physician Orders Details Patient Name: Cathy Hines Date of Service: 04/14/2018 2:45 PM Medical Record Number: [id number] Patient Account Number: [id number] Date of Birth/Sex: 1946/01/03 (72 y.o. F) Treating RN: Cornell Barman Primary Care Provider: Emily Filbert Other Clinician: Referring Provider: Emily Filbert Treating Provider/Extender: Tito Dine in Treatment: 1 Verbal / Phone Orders: No Diagnosis Coding Wound Cleansing Wound #1 Right,Midline Lower Leg o Clean wound with Normal Saline. Anesthetic (add to Medication List) Wound #1 Right,Midline Lower Leg o Topical Lidocaine 4% cream applied to  wound bed prior to debridement (In Clinic Only). Primary Wound Dressing Wound #1 Right,Midline Lower Leg o Iodoflex Secondary Dressing o Kerlix and Coban Wound #1 Right,Midline Lower Leg o ABD pad o XtraSorb Dressing Change Frequency Wound #1 Right,Midline Lower Leg o Change dressing every week Follow-up Appointments Wound #1 Right,Midline Lower Leg o Return Appointment in 1 week. o Nurse Visit as needed Edema Control o Elevate legs to the level of the heart and pump ankles as often as possible Electronic Signature(s) Signed: 04/14/2018 5:21:30 PM By: Gretta Cool, BSN, RN, CWS, Kim RN, BSN Signed: 04/15/2018 8:24:56 AM By: Linton Ham MD Entered By: Gretta Cool, BSN, RN, CWS, Kim on 04/14/2018 15:31:31 [place], [place] ([id number]) -------------------------------------------------------------------------------- Problem List Details Patient Name: Cathy Hines Date of Service: 04/14/2018 2:45 PM Medical Record Number: [id number] Patient Account Number: [id number] Date of Birth/Sex: [birth year] (72 y.o. F) Treating RN: Cornell Barman Primary Care Provider: Emily Filbert Other Clinician: Referring Provider: Emily Filbert Treating Provider/Extender: Tito Dine in Treatment: 1 Active Problems ICD-10 Impacting Encounter Code Description Active Date Wound Healing Diagnosis S81.811D Laceration without foreign body, right lower leg, subsequent 04/07/2018 Yes encounter L97.213 Non-pressure chronic ulcer of right calf with necrosis of 04/07/2018 Yes muscle E11.622 Type 2 diabetes mellitus with other skin ulcer 04/07/2018 Yes E11.40 Type 2 diabetes mellitus with diabetic neuropathy, 04/07/2018  Yes unspecified Inactive Problems Resolved Problems Electronic Signature(s) Signed: 04/15/2018 8:24:56 AM By: Linton Ham MD Entered By: Linton Ham on 04/14/2018 16:18:30 Cathy Hines ([id number]) -------------------------------------------------------------------------------- [place] Note Details Patient Name: Cathy Hines Date of Service: 04/14/2018 2:45 PM Medical Record Number: [id number] Patient Account Number: [id number] Date of Birth/Sex: [birth year] (72 y.o. F) Treating RN: Cornell Barman Primary Care Provider: Emily Filbert Other Clinician: Referring Provider: Emily Filbert Treating Provider/Extender: Tito Dine in Treatment: 1 Subjective History of Present Illness (HPI) 04/07/18 ADMISSION This is a 72 year old woman with type 2 diabetes and a history of diabetic neuropathy. About a month ago she fell while walking in her yard and suffered a laceration of her right anterior shin over the tibia. She tried to re-adhere the skin flaps. She went to see her primary physician on 03/11/18 was given antibiotic ointment, Telfa and Coban and was prescribed doxycycline. I believe she had an x-ray ordered that did not show up particular abnormality. On 03/15/18 Keflex was added. Most recently she has been putting Betadine on this and Vaseline and was told to soak this in Epson salts. She does not have a wound history. She has type 2 diabetes with neuropathy, hyperlipidemia hypertension hypothyroidism mitral valve prolapse and atrial fibrillation, osteoarthritis and asthma. We could not test her ABIs on either side because of complaints about pain, even the left side doesn't have a wound 04/14/18; she comes in today telling us that the debridement I did last week caused her to vomit when she got home. We are using Iodoflex to the wound as the primary dressing under Kerlix and Coban. She also had complaints about the wrap the heat etc. Objective Constitutional Patient  is hypertensive.. Pulse regular and within target range for patient.. Temperature is normal and within the target range for the patient.Marland Kitchen appears in no distress. Vitals Time Taken: 3:02 PM, Height: 59 in, Weight: 163.4 lbs, BMI: 33, Temperature: 98.0 F, Pulse: 78 bpm, Respiratory Rate: 18 breaths/min, Blood Pressure: 144/53 mmHg. Respiratory Respiratory effort is easy and symmetric bilaterally. Rate is normal at rest and on room air.. Cardiovascular Pedal pulses palpable and strong bilaterally.. minimal  edema in both legs. Lymphatic nonpalpable in the popliteal or inguinal area. Psychiatric No evidence of depression, anxiety, or agitation. Calm, cooperative, and communicative. Appropriate interactions and affect.Loreli Dollar, Oletta Lamas ([id number]) General Notes: wound exam; fairly extensive linear wound in the right anterior tibia. About 30% of this has reasonable looking granulation the rest of it the same necrotic debris that I removed last week. Nevertheless I did not push debridement on her this week. There is no evidence of surrounding infection Integumentary (Hair, Skin) changes of chronic venous insufficiency in both lower extremities. Minimal edema. Wound #1 status is Open. Original cause of wound was Trauma. The wound is located on the Right,Midline Lower Leg. The wound measures 4.2cm length x 1.3cm width x 0.2cm depth; 4.288cm^2 area and 0.858cm^3 volume. There is Fat Layer (Subcutaneous Tissue) Exposed exposed. There is no tunneling or undermining noted. There is a large amount of serosanguineous drainage noted. The wound margin is distinct with the outline attached to the wound base. There is medium (34-66%) granulation within the wound bed. There is a medium (34-66%) amount of necrotic tissue within the wound bed including Adherent Slough. The periwound skin appearance exhibited: Erythema. The periwound skin appearance did not exhibit: Callus, Crepitus, Excoriation, Induration,  Rash, Scarring, Dry/Scaly, Maceration, Atrophie Blanche, Cyanosis, Ecchymosis, Hemosiderin Staining, Mottled, Pallor, Rubor. The surrounding wound skin color is noted with erythema which is circumferential. Periwound temperature was noted as No Abnormality. The periwound has tenderness on palpation. Assessment Active Problems ICD-10 S81.811D - Laceration without foreign body, right lower leg, subsequent encounter L97.213 - Non-pressure chronic ulcer of right calf with necrosis of muscle E11.622 - Type 2 diabetes mellitus with other skin ulcer E11.40 - Type 2 diabetes mellitus with diabetic neuropathy, unspecified Plan Wound Cleansing: Wound #1 Right,Midline Lower Leg: Clean wound with Normal Saline. Anesthetic (add to Medication List): Wound #1 Right,Midline Lower Leg: Topical Lidocaine 4% cream applied to wound bed prior to debridement (In Clinic Only). Primary Wound Dressing: Wound #1 Right,Midline Lower Leg: Iodoflex Secondary Dressing: Kerlix and Coban Wound #1 Right,Midline Lower Leg: ABD pad XtraSorb Dressing Change Frequency: Wound #1 Right,Midline Lower Leg: Change dressing every week Follow-up Appointments: Wound #1 Right,Midline Lower Leg: Return Appointment in 1 week. ROSS, HEFFERAN ([id number]) Nurse Visit as needed Edema Control: Elevate legs to the level of the heart and pump ankles as often as possible #1 I am going to continue with the Iodoflex under Kerlix Covan with an ABD covering #2 the patient would not allow debridement today. I'll see what this looks like next week #3 we weren't able to get ABIs on either leg on intake on this patient I would like to try again on this next week. Electronic Signature(s) Signed: 04/15/2018 8:24:56 AM By: Linton Ham MD Entered By: Linton Ham on 04/14/2018 16:25:13 Cathy Hines ([id number]) -------------------------------------------------------------------------------- [place] Details Patient Name:  Cathy Hines Date of Service: 04/14/2018 Medical Record Number: [id number] Patient Account Number: [id number] Date of Birth/Sex: 1946/07/13 (72 y.o. F) Treating RN: Cornell Barman Primary Care Provider: Emily Filbert Other Clinician: Referring Provider: Emily Filbert Treating Provider/Extender: Tito Dine in Treatment: 1 Diagnosis Coding ICD-10 Codes Code Description [id number] Laceration without foreign body, right lower leg, subsequent encounter L97.213 Non-pressure chronic ulcer of right calf with necrosis of muscle E11.622 Type 2 diabetes mellitus with other skin ulcer E11.40 Type 2 diabetes mellitus with diabetic neuropathy, unspecified Facility Procedures CPT4 Code: [id number] Description: 99213 - WOUND CARE VISIT-LEV 3 EST PT Modifier: Quantity: 1 Physician Procedures  CPT4 Code: 0174944 Description: 96759 - WC PHYS LEVEL 3 - EST PT ICD-10 Diagnosis Description L97.213 Non-pressure chronic ulcer of right calf with necrosis of S81.811D Laceration without foreign body, right lower leg, subseque E11.622 Type 2 diabetes mellitus with other  skin ulcer Modifier: muscle nt encounter Quantity: 1 Electronic Signature(s) Signed: 04/15/2018 8:24:56 AM By: Linton Ham MD Entered By: Linton Ham on 04/14/2018 16:25:44

## 2018-04-18 NOTE — Progress Notes (Signed)
Cathy Hines ([id number]) Visit Report for 04/14/2018 Arrival Information Details Patient Name: Cathy Hines, Cathy Hines Date of Service: 04/14/2018 2:45 PM Medical Record Number: [id number] Patient Account Number: [id number] Date of Birth/Sex: 1946-04-02 (72 y.o. F) Treating RN: Ahmed Prima Primary Care Rodrigues Urbanek: Emily Filbert Other Clinician: Referring Diany Formosa: Emily Filbert Treating Alila Sotero/Extender: Tito Dine in Treatment: 1 Visit Information History Since Last Visit All ordered tests and consults were completed: No Patient Arrived: Ambulatory Added or deleted any medications: No Arrival Time: 15:01 Any new allergies or adverse reactions: No Accompanied By: daughter Had a fall or experienced change in No Transfer Assistance: None activities of daily living that may affect Patient Identification Verified: Yes risk of falls: Secondary Verification Process Completed: Yes Signs or symptoms of abuse/neglect since last visito No Patient Requires Transmission-Based No Hospitalized since last visit: No Precautions: Implantable device outside of the clinic excluding No Patient Has Alerts: No cellular tissue based products placed in the center since last visit: Has Dressing in Place as Prescribed: Yes Has Compression in Place as Prescribed: Yes Pain Present Now: No Electronic Signature(s) Signed: 04/16/2018 5:38:20 PM By: Alric Quan Entered By: Alric Quan on 04/14/2018 15:02:09 Cathy Hines ([id number]) -------------------------------------------------------------------------------- Clinic Level of Care Assessment Details Patient Name: Cathy Hines Date of Service: 04/14/2018 2:45 PM Medical Record Number: [id number] Patient Account Number: [id number] Date of Birth/Sex: 05-30-1946 (72 y.o. F) Treating RN: Cornell Barman Primary Care Bev Drennen: Emily Filbert Other Clinician: Referring Lennie Vasco: Emily Filbert Treating Child Campoy/Extender: Tito Dine in Treatment: 1 Clinic Level of Care Assessment Items TOOL 4 Quantity Score []  - Use when only an EandM is performed on FOLLOW-UP visit 0 ASSESSMENTS - Nursing Assessment / Reassessment X - Reassessment of Co-morbidities (includes updates in patient status) 1 10 X- 1 5 Reassessment of Adherence to Treatment Plan ASSESSMENTS - Wound and Skin Assessment / Reassessment X - Simple Wound Assessment / Reassessment - one wound 1 5 []  - 0 Complex Wound Assessment / Reassessment - multiple wounds []  - 0 Dermatologic / Skin Assessment (not related to wound area) ASSESSMENTS - Focused Assessment []  - Circumferential Edema Measurements - multi extremities 0 []  - 0 Nutritional Assessment / Counseling / Intervention []  - 0 Lower Extremity Assessment (monofilament, tuning fork, pulses) []  - 0 Peripheral Arterial Disease Assessment (using hand held doppler) ASSESSMENTS - Ostomy and/or Continence Assessment and Care []  - Incontinence Assessment and Management 0 []  - 0 Ostomy Care Assessment and Management (repouching, etc.) PROCESS - Coordination of Care X - Simple Patient / Family Education for ongoing care 1 15 []  - 0 Complex (extensive) Patient / Family Education for ongoing care []  - 0 Staff obtains Programmer, systems, Records, Test Results / Process Orders []  - 0 Staff telephones HHA, Nursing Homes / Clarify orders / etc []  - 0 Routine Transfer to another Facility (non-emergent condition) []  - 0 Routine Hospital Admission (non-emergent condition) []  - 0 New Admissions / Biomedical engineer / Ordering NPWT, Apligraf, etc. []  - 0 Emergency Hospital Admission (emergent condition) X- 1 10 Simple Discharge Coordination Gershman, Izora L. ([id number]) []  - 0 Complex (extensive) Discharge Coordination PROCESS - Special Needs []  - Pediatric / Minor Patient Management 0 []  - 0 Isolation Patient Management []  - 0 Hearing / Language / Visual special needs []  - 0 Assessment of  Community assistance (transportation, D/C planning, etc.) []  - 0 Additional assistance / Altered mentation []  - 0 Support Surface(s) Assessment (bed, cushion, seat, etc.) INTERVENTIONS - Wound Cleansing / Measurement  X - Simple Wound Cleansing - one wound 1 5 []  - 0 Complex Wound Cleansing - multiple wounds X- 1 5 Wound Imaging (photographs - any number of wounds) []  - 0 Wound Tracing (instead of photographs) X- 1 5 Simple Wound Measurement - one wound []  - 0 Complex Wound Measurement - multiple wounds INTERVENTIONS - Wound Dressings []  - Small Wound Dressing one or multiple wounds 0 X- 1 15 Medium Wound Dressing one or multiple wounds []  - 0 Large Wound Dressing one or multiple wounds []  - 0 Application of Medications - topical []  - 0 Application of Medications - injection INTERVENTIONS - Miscellaneous []  - External ear exam 0 []  - 0 Specimen Collection (cultures, biopsies, blood, body fluids, etc.) []  - 0 Specimen(s) / Culture(s) sent or taken to Lab for analysis []  - 0 Patient Transfer (multiple staff / Civil Service fast streamer / Similar devices) []  - 0 Simple Staple / Suture removal (25 or less) []  - 0 Complex Staple / Suture removal (26 or more) []  - 0 Hypo / Hyperglycemic Management (close monitor of Blood Glucose) []  - 0 Ankle / Brachial Index (ABI) - do not check if billed separately X- 1 5 Vital Signs Spurrier, Sheccid L. ([id number]) Has the patient been seen at the hospital within the last three years: Yes Total Score: 80 Level Of Care: New/Established - Level 3 Electronic Signature(s) Signed: 04/14/2018 5:21:30 PM By: Gretta Cool, BSN, RN, CWS, Kim RN, BSN Entered By: Gretta Cool, BSN, RN, CWS, Kim on 04/14/2018 15:32:07 Cathy Hines ([id number]) -------------------------------------------------------------------------------- Encounter Discharge Information Details Patient Name: Cathy Hines Date of Service: 04/14/2018 2:45 PM Medical Record Number: [id number] Patient  Account Number: [id number] Date of Birth/Sex: 30-Sep-1946 (72 y.o. F) Treating RN: Roger Shelter Primary Care Tayloranne Lekas: Emily Filbert Other Clinician: Referring Recia Sons: Emily Filbert Treating Dayan Desa/Extender: Tito Dine in Treatment: 1 Encounter Discharge Information Items Discharge Condition: Stable Ambulatory Status: Ambulatory Discharge Destination: Home Transportation: Private Auto Accompanied By: dAUGHTER Schedule Follow-up Appointment: Yes Clinical Summary of Care: Electronic Signature(s) Signed: 04/14/2018 4:15:53 PM By: Roger Shelter Entered By: Roger Shelter on 04/14/2018 15:44:05 Cathy Hines ([id number]) -------------------------------------------------------------------------------- Lower Extremity Assessment Details Patient Name: Cathy Hines Date of Service: 04/14/2018 2:45 PM Medical Record Number: [id number] Patient Account Number: [id number] Date of Birth/Sex: 10/07/1946 (72 y.o. F) Treating RN: Ahmed Prima Primary Care Rigo Letts: Emily Filbert Other Clinician: Referring Joniqua Sidle: Emily Filbert Treating Maylen Waltermire/Extender: Tito Dine in Treatment: 1 Edema Assessment Assessed: [Left: No] [Right: No] [Left: Edema] [Right: :] Calf Left: Right: Point of Measurement: 30 cm From Medial Instep cm 37.3 cm Ankle Left: Right: Point of Measurement: 8 cm From Medial Instep cm 19.8 cm Vascular Assessment Pulses: Dorsalis Pedis Palpable: [Right:Yes] Posterior Tibial Extremity colors, hair growth, and conditions: Extremity Color: [Right:Normal] Temperature of Extremity: [Right:Warm] Capillary Refill: [Right:< 3 seconds] Toe Nail Assessment Left: Right: Thick: No Discolored: No Deformed: No Improper Length and Hygiene: Yes Electronic Signature(s) Signed: 04/16/2018 5:38:20 PM By: Alric Quan Entered By: Alric Quan on 04/14/2018 15:09:01 Cathy Hines  ([id number]) -------------------------------------------------------------------------------- Multi Wound Chart Details Patient Name: Cathy Hines Date of Service: 04/14/2018 2:45 PM Medical Record Number: [id number] Patient Account Number: [id number] Date of Birth/Sex: 1946/04/06 (72 y.o. F) Treating RN: Cornell Barman Primary Care Nikea Settle: Emily Filbert Other Clinician: Referring Carmelo Reidel: Emily Filbert Treating Kabella Cassidy/Extender: Tito Dine in Treatment: 1 Vital Signs Height(in): 59 Pulse(bpm): 78 Weight(lbs): 163.4 Blood Pressure(mmHg): 144/53 Body Mass Index(BMI): 33 Temperature(F): 98.0 Respiratory Rate 18 (breaths/min): Photos: [  1:No Photos] [N/A:N/A] Wound Location: [1:Right Lower Leg - Midline] [N/A:N/A] Wounding Event: [1:Trauma] [N/A:N/A] Primary Etiology: [1:Trauma, Other] [N/A:N/A] Comorbid History: [1:Cataracts, Chronic sinus problems/congestion, Lymphedema, Asthma, Sleep Apnea, Angina, Arrhythmia, Hypertension, Type II Diabetes, Osteoarthritis, Neuropathy] [N/A:N/A] Date Acquired: [1:03/07/2018] [N/A:N/A] Weeks of Treatment: [1:1] [N/A:N/A] Wound Status: [1:Open] [N/A:N/A] Measurements L x W x D [1:4.2x1.3x0.2] [N/A:N/A] (cm) Area (cm) : [1:4.288] [N/A:N/A] Volume (cm) : [1:0.858] [N/A:N/A] % Reduction in Area: [1:45.80%] [N/A:N/A] % Reduction in Volume: [1:63.90%] [N/A:N/A] Classification: [1:Full Thickness Without Exposed Support Structures] [N/A:N/A] Exudate Amount: [1:Large] [N/A:N/A] Exudate Type: [1:Serosanguineous] [N/A:N/A] Exudate Color: [1:red, brown] [N/A:N/A] Wound Margin: [1:Distinct, outline attached] [N/A:N/A] Granulation Amount: [1:Medium (34-66%)] [N/A:N/A] Necrotic Amount: [1:Medium (34-66%)] [N/A:N/A] Exposed Structures: [1:Fat Layer (Subcutaneous Tissue) Exposed: Yes Fascia: No Tendon: No Muscle: No Joint: No Bone: No] [N/A:N/A] Epithelialization: [1:None] [N/A:N/A] Periwound Skin Texture: [N/A:N/A] Excoriation:  No Induration: No Callus: No Crepitus: No Rash: No Scarring: No Periwound Skin Moisture: Maceration: No N/A N/A Dry/Scaly: No Periwound Skin Color: Erythema: Yes N/A N/A Atrophie Blanche: No Cyanosis: No Ecchymosis: No Hemosiderin Staining: No Mottled: No Pallor: No Rubor: No Erythema Location: Circumferential N/A N/A Temperature: No Abnormality N/A N/A Tenderness on Palpation: Yes N/A N/A Wound Preparation: Ulcer Cleansing: N/A N/A Rinsed/Irrigated with Saline, Other: soap and water Topical Anesthetic Applied: Other: lidocaine 4% Treatment Notes Wound #1 (Right, Midline Lower Leg) 1. Cleansed with: Clean wound with Normal Saline 2. Anesthetic Topical Lidocaine 4% cream to wound bed prior to debridement 4. Dressing Applied: Iodoflex 5. Secondary Dressing Applied ABD Pad Notes xtrasorb, kerlix/coban wrap Electronic Signature(s) Signed: 04/15/2018 8:24:56 AM By: Linton Ham MD Entered By: Linton Ham on 04/14/2018 16:18:40 Cathy Hines ([id number]) -------------------------------------------------------------------------------- Multi-Disciplinary Care Plan Details Patient Name: Cathy Hines Date of Service: 04/14/2018 2:45 PM Medical Record Number: [id number] Patient Account Number: [id number] Date of Birth/Sex: 1946/06/13 (72 y.o. F) Treating RN: Cornell Barman Primary Care Japhet Morgenthaler: Emily Filbert Other Clinician: Referring Heidee Audi: Emily Filbert Treating Issaac Shipper/Extender: Tito Dine in Treatment: 1 Active Inactive ` Abuse / Safety / Falls / Self Care Management Nursing Diagnoses: History of Falls Goals: Patient will remain injury free related to falls Date Initiated: 04/07/2018 Target Resolution Date: 05/08/2018 Goal Status: Active Interventions: Assess self care needs on admission and as needed Notes: ` Nutrition Nursing Diagnoses: Impaired glucose control: actual or potential Goals: Patient/caregiver will maintain  therapeutic glucose control Date Initiated: 04/07/2018 Target Resolution Date: 05/08/2018 Goal Status: Active Interventions: Assess HgA1c results as ordered upon admission and as needed Treatment Activities: Patient referred to Primary Care Physician for further nutritional evaluation : 04/07/2018 Notes: ` Orientation to the Wound Care Program Nursing Diagnoses: Knowledge deficit related to the wound healing center program Goals: Patient/caregiver will verbalize understanding of the [place] Date Initiated: 04/07/2018 Target Resolution Date: 05/08/2018 MANASVI, DICKARD ([id number]) Goal Status: Active Interventions: Provide education on orientation to the wound center Notes: ` Soft Tissue Infection Nursing Diagnoses: Potential for infection: soft tissue Goals: Patient will remain free of wound infection Date Initiated: 04/07/2018 Target Resolution Date: 05/08/2018 Goal Status: Active Interventions: Assess signs and symptoms of infection every visit Notes: ` Wound/Skin Impairment Nursing Diagnoses: Impaired tissue integrity Goals: Ulcer/skin breakdown will have a volume reduction of 80% by week 12 Date Initiated: 04/07/2018 Target Resolution Date: 07/08/2018 Goal Status: Active Interventions: Assess ulceration(s) every visit Treatment Activities: Skin care regimen initiated : 04/07/2018 Topical wound management initiated : 04/07/2018 Notes: Electronic Signature(s) Signed: 04/14/2018 5:21:30 PM By: Gretta Cool, BSN, RN, CWS, Kim  RN, BSN Entered By: Gretta Cool, BSN, RN, CWS, Kim on 04/14/2018 15:29:26 Cathy Hines ([id number]) -------------------------------------------------------------------------------- Pain Assessment Details Patient Name: Cathy Hines Date of Service: 04/14/2018 2:45 PM Medical Record Number: [id number] Patient Account Number: [id number] Date of Birth/Sex: January 14, 1946 (72 y.o. F) Treating RN: Ahmed Prima Primary Care Kamorah Nevils:  Emily Filbert Other Clinician: Referring Joyous Gleghorn: Emily Filbert Treating Kyiesha Millward/Extender: Tito Dine in Treatment: 1 Active Problems Location of Pain Severity and Description of Pain Patient Has Paino No Site Locations Pain Management and Medication Current Pain Management: Electronic Signature(s) Signed: 04/16/2018 5:38:20 PM By: Alric Quan Entered By: Alric Quan on 04/14/2018 15:02:23 Cathy Hines ([id number]) -------------------------------------------------------------------------------- Patient/Caregiver Education Details Patient Name: Cathy Hines Date of Service: 04/14/2018 2:45 PM Medical Record Number: [id number] Patient Account Number: [id number] Date of Birth/Gender: 14-Sep-1946 (72 y.o. F) Treating RN: Roger Shelter Primary Care Physician: Emily Filbert Other Clinician: Referring Physician: Emily Filbert Treating Physician/Extender: Tito Dine in Treatment: 1 Education Assessment Education Provided To: Patient and Caregiver Education Topics Provided Wound/Skin Impairment: Handouts: Caring for Your Ulcer Methods: Explain/Verbal Responses: State content correctly Electronic Signature(s) Signed: 04/14/2018 4:15:53 PM By: Roger Shelter Entered By: Roger Shelter on 04/14/2018 15:44:20 Cathy Hines ([id number]) -------------------------------------------------------------------------------- Wound Assessment Details Patient Name: Cathy Hines Date of Service: 04/14/2018 2:45 PM Medical Record Number: [id number] Patient Account Number: [id number] Date of Birth/Sex: Apr 10, 1946 (72 y.o. F) Treating RN: Ahmed Prima Primary Care Annisten Manchester: Emily Filbert Other Clinician: Referring Ebon Ketchum: Emily Filbert Treating Gabor Lusk/Extender: Tito Dine in Treatment: 1 Wound Status Wound Number: 1 Primary Trauma, Other Etiology: Wound Location: Right Lower Leg - Midline Wound Open Wounding Event:  Trauma Status: Date Acquired: 03/07/2018 Comorbid Cataracts, Chronic sinus problems/congestion, Weeks Of Treatment: 1 History: Lymphedema, Asthma, Sleep Apnea, Angina, Clustered Wound: No Arrhythmia, Hypertension, Type II Diabetes, Osteoarthritis, Neuropathy Wound Measurements Length: (cm) 4.2 Width: (cm) 1.3 Depth: (cm) 0.2 Area: (cm) 4.288 Volume: (cm) 0.858 % Reduction in Area: 45.8% % Reduction in Volume: 63.9% Epithelialization: None Tunneling: No Undermining: No Wound Description Full Thickness Without Exposed Support Foul Classification: Structures Sloug Wound Margin: Distinct, outline attached Exudate Large Amount: Exudate Type: Serosanguineous Exudate Color: red, brown Odor After Cleansing: No h/Fibrino Yes Wound Bed Granulation Amount: Medium (34-66%) Exposed Structure Necrotic Amount: Medium (34-66%) Fascia Exposed: No Necrotic Quality: Adherent Slough Fat Layer (Subcutaneous Tissue) Exposed: Yes Tendon Exposed: No Muscle Exposed: No Joint Exposed: No Bone Exposed: No Periwound Skin Texture Texture Color No Abnormalities Noted: No No Abnormalities Noted: No Callus: No Atrophie Blanche: No Crepitus: No Cyanosis: No Excoriation: No Ecchymosis: No Induration: No Erythema: Yes Rash: No Erythema Location: Circumferential Scarring: No Hemosiderin Staining: No Mottled: No Moisture Pallor: No No Abnormalities Noted: No Santore, Kimiyo L. ([id number]) Dry / Scaly: No Rubor: No Maceration: No Temperature / Pain Temperature: No Abnormality Tenderness on Palpation: Yes Wound Preparation Ulcer Cleansing: Rinsed/Irrigated with Saline, Other: soap and water, Topical Anesthetic Applied: Other: lidocaine 4%, Treatment Notes Wound #1 (Right, Midline Lower Leg) 1. Cleansed with: Clean wound with Normal Saline 2. Anesthetic Topical Lidocaine 4% cream to wound bed prior to debridement 4. Dressing Applied: Iodoflex 5. Secondary Dressing  Applied ABD Pad Notes xtrasorb, kerlix/coban wrap Electronic Signature(s) Signed: 04/16/2018 5:38:20 PM By: Alric Quan Entered By: Alric Quan on 04/14/2018 15:15:03 Cathy Hines ([id number]) -------------------------------------------------------------------------------- [place] Details Patient Name: Cathy Hines Date of Service: 04/14/2018 2:45 PM Medical Record Number: [id number] Patient Account Number: [id number] Date of Birth/Sex: Mar 11, 1946 (72 y.o. F) Treating  RN: Ahmed Prima Primary Care Hollie Wojahn: Emily Filbert Other Clinician: Referring Leveon Pelzer: Emily Filbert Treating Kadajah Kjos/Extender: Tito Dine in Treatment: 1 Vital Signs Time Taken: 15:02 Temperature (F): 98.0 Height (in): 59 Pulse (bpm): 78 Weight (lbs): 163.4 Respiratory Rate (breaths/min): 18 Body Mass Index (BMI): 33 Blood Pressure (mmHg): 144/53 Reference Range: 80 - 120 mg / dl Electronic Signature(s) Signed: 04/16/2018 5:38:20 PM By: Alric Quan Entered By: Alric Quan on 04/14/2018 15:04:14

## 2018-04-21 ENCOUNTER — Encounter: Payer: Medicare HMO | Attending: Internal Medicine | Admitting: Internal Medicine

## 2018-04-21 DIAGNOSIS — I4891 Unspecified atrial fibrillation: Secondary | ICD-10-CM | POA: Diagnosis not present

## 2018-04-21 DIAGNOSIS — L97213 Non-pressure chronic ulcer of right calf with necrosis of muscle: Secondary | ICD-10-CM | POA: Diagnosis not present

## 2018-04-21 DIAGNOSIS — E11622 Type 2 diabetes mellitus with other skin ulcer: Secondary | ICD-10-CM | POA: Insufficient documentation

## 2018-04-21 DIAGNOSIS — I341 Nonrheumatic mitral (valve) prolapse: Secondary | ICD-10-CM | POA: Insufficient documentation

## 2018-04-21 DIAGNOSIS — J45909 Unspecified asthma, uncomplicated: Secondary | ICD-10-CM | POA: Diagnosis not present

## 2018-04-21 DIAGNOSIS — E785 Hyperlipidemia, unspecified: Secondary | ICD-10-CM | POA: Diagnosis not present

## 2018-04-21 DIAGNOSIS — I10 Essential (primary) hypertension: Secondary | ICD-10-CM | POA: Insufficient documentation

## 2018-04-21 DIAGNOSIS — E1151 Type 2 diabetes mellitus with diabetic peripheral angiopathy without gangrene: Secondary | ICD-10-CM | POA: Diagnosis not present

## 2018-04-21 DIAGNOSIS — E1140 Type 2 diabetes mellitus with diabetic neuropathy, unspecified: Secondary | ICD-10-CM | POA: Diagnosis not present

## 2018-04-21 DIAGNOSIS — S81811A Laceration without foreign body, right lower leg, initial encounter: Secondary | ICD-10-CM | POA: Diagnosis not present

## 2018-04-21 DIAGNOSIS — E039 Hypothyroidism, unspecified: Secondary | ICD-10-CM | POA: Insufficient documentation

## 2018-04-23 NOTE — Progress Notes (Signed)
Cathy Hines, Cathy Hines ([id number]) Visit Report for 04/21/2018 Debridement Details Patient Name: Cathy Hines, Cathy Hines Date of Service: 04/21/2018 3:30 PM Medical Record Number: [id number] Patient Account Number: [id number] Date of Birth/Sex: 04-25-1946 (72 y.o. F) Treating RN: Cornell Barman Primary Care Provider: Emily Filbert Other Clinician: Referring Provider: Emily Filbert Treating Provider/Extender: Tito Dine in Treatment: 2 Debridement Performed for Wound #1 Right,Midline Lower Leg Assessment: Performed By: Physician Cathy Dillon, MD Debridement Type: Debridement Pre-procedure Verification/Time Yes - 15:50 Out Taken: Start Time: 15:50 Pain Control: Other : lidocaine 4% Total Area Debrided (L x W): 3.8 (cm) x 1 (cm) = 3.8 (cm) Tissue and other material Viable, Non-Viable, Eschar, Subcutaneous, Skin: Dermis debrided: Level: Skin/Subcutaneous Tissue Debridement Description: Excisional Instrument: Curette Bleeding: Moderate Hemostasis Achieved: Pressure End Time: 15:57 Procedural Pain: 2 Post Procedural Pain: 2 Response to Treatment: Procedure was tolerated well Level of Consciousness: Awake and Alert Post Debridement Measurements of Total Wound Length: (cm) 3.8 Width: (cm) 1 Depth: (cm) 0.3 Volume: (cm) 0.895 Character of Wound/Ulcer Post Debridement: Requires Further Debridement Post Procedure Diagnosis Same as Pre-procedure Electronic Signature(s) Signed: 04/21/2018 6:13:14 PM By: Linton Ham MD Signed: 04/22/2018 2:15:42 PM By: Gretta Cool, BSN, RN, CWS, Kim RN, BSN Entered By: Linton Ham on 04/21/2018 17:08:02 Cathy Hines ([id number]) -------------------------------------------------------------------------------- HPI Details Patient Name: Cathy Hines Date of Service: 04/21/2018 3:30 PM Medical Record Number: [id number] Patient Account Number: [id number] Date of Birth/Sex: [birth year] (72 y.o. F) Treating RN: Cornell Barman Primary Care Provider:  Emily Filbert Other Clinician: Referring Provider: Emily Filbert Treating Provider/Extender: Tito Dine in Treatment: 2 History of Present Illness HPI Description: 04/07/18 ADMISSION This is a 72 year old woman with type 2 diabetes and a history of diabetic neuropathy. About a month ago she fell while walking in her yard and suffered a laceration of her right anterior shin over the tibia. She tried to re-adhere the skin flaps. She went to see her primary physician on 03/11/18 was given antibiotic ointment, Telfa and Coban and was prescribed doxycycline. I believe she had an x-ray ordered that did not show up particular abnormality. On 03/15/18 Keflex was added. Most recently she has been putting Betadine on this and Vaseline and was told to soak this in Epson salts. She does not have a wound history. She has type 2 diabetes with neuropathy, hyperlipidemia hypertension hypothyroidism mitral valve prolapse and atrial fibrillation, osteoarthritis and asthma. We could not test her ABIs on either side because of complaints about pain, even the left side doesn't have a wound 04/14/18; she comes in today telling us that the debridement I did last week caused her to vomit when she got home. We are using Iodoflex to the wound as the primary dressing under Kerlix and Coban. She also had complaints about the wrap the heat etc. 04/21/18; using Iodoflex to this traumatic area on the right anterior shin. Arise with a better looking surface although still a difficult amount of tightly adherent fibrinous debris. Electronic Signature(s) Signed: 04/21/2018 6:13:14 PM By: Linton Ham MD Entered By: Linton Ham on 04/21/2018 17:08:46 Cathy Hines ([id number]) -------------------------------------------------------------------------------- Physical Exam Details Patient Name: Cathy Hines Date of Service: 04/21/2018 3:30 PM Medical Record Number: [id number] Patient Account Number:  [id number] Date of Birth/Sex: [birth year] (72 y.o. F) Treating RN: Cornell Barman Primary Care Provider: Emily Filbert Other Clinician: Referring Provider: Emily Filbert Treating Provider/Extender: Tito Dine in Treatment: 2 Notes wound exam; linear wound in the right anterior tibia. Again tightly adherent necrotic  debris over the wound surface removed with a #3 curet. In spite of her anxiety she tolerated this quite well we used topical lidocaine and topical benzocaine. Wound dimensions are smaller Electronic Signature(s) Signed: 04/21/2018 6:13:14 PM By: Linton Ham MD Entered By: Linton Ham on 04/21/2018 17:09:33 Cathy Hines ([id number]) -------------------------------------------------------------------------------- Physician Orders Details Patient Name: Cathy Hines Date of Service: 04/21/2018 3:30 PM Medical Record Number: [id number] Patient Account Number: [id number] Date of Birth/Sex: [birth year] (72 y.o. F) Treating RN: Cornell Barman Primary Care Provider: Emily Filbert Other Clinician: Referring Provider: Emily Filbert Treating Provider/Extender: Tito Dine in Treatment: 2 Verbal / Phone Orders: No Diagnosis Coding Wound Cleansing Wound #1 Right,Midline Lower Leg o Clean wound with Normal Saline. Anesthetic (add to Medication List) Wound #1 Right,Midline Lower Leg o Topical Lidocaine 4% cream applied to wound bed prior to debridement (In Clinic Only). Primary Wound Dressing Wound #1 Right,Midline Lower Leg o Iodoflex Secondary Dressing o Kerlix and Coban Wound #1 Right,Midline Lower Leg o ABD pad o XtraSorb Dressing Change Frequency Wound #1 Right,Midline Lower Leg o Change dressing every week Follow-up Appointments Wound #1 Right,Midline Lower Leg o Return Appointment in 1 week. o Nurse Visit as needed Edema Control o Elevate legs to the level of the heart and pump ankles as often as possible Electronic  Signature(s) Signed: 04/21/2018 6:13:14 PM By: Linton Ham MD Signed: 04/22/2018 2:15:42 PM By: Gretta Cool, BSN, RN, CWS, Kim RN, BSN Entered By: Gretta Cool, BSN, RN, CWS, Kim on 04/21/2018 15:57:59 Cathy Hines ([id number]) -------------------------------------------------------------------------------- Problem List Details Patient Name: Cathy Hines Date of Service: 04/21/2018 3:30 PM Medical Record Number: [id number] Patient Account Number: [id number] Date of Birth/Sex: October 25, 1946 (72 y.o. 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 04/07/2018 No Yes muscle E11.622 Type 2 diabetes mellitus with other skin ulcer 04/07/2018 No Yes E11.40 Type 2 diabetes mellitus with diabetic neuropathy, 04/07/2018 No Yes unspecified Inactive Problems Resolved Problems Electronic Signature(s) Signed: 04/21/2018 6:13:14 PM By: Linton Ham MD Entered By: Linton Ham on 04/21/2018 17:07:25 Cathy Hines ([id number]) -------------------------------------------------------------------------------- Progress Note Details Patient Name: Cathy Hines Date of Service: 04/21/2018 3:30 PM Medical Record Number: [id number] Patient Account Number: [id number] Date of Birth/Sex: [birth year] (72 y.o. F) Treating RN: Cornell Barman Primary Care Provider: Emily Filbert Other Clinician: Referring Provider: Emily Filbert Treating Provider/Extender: Tito Dine in Treatment: 2 Subjective History of Present Illness (HPI) 04/07/18 ADMISSION This is a 72 year old woman with type 2 diabetes and a history of diabetic  neuropathy. About a month ago she fell while walking in her yard and suffered a laceration of her right anterior shin over the tibia. She tried to re-adhere the skin flaps. She went to see her primary physician on 03/11/18 was given antibiotic ointment, Telfa and Coban and was prescribed doxycycline. I believe she had an x-ray ordered that did not show up particular abnormality. On 03/15/18 Keflex was added. Most recently she has been putting Betadine on this and Vaseline and was told to soak this in Epson salts. She does not have a wound history. She has type 2 diabetes with neuropathy, hyperlipidemia hypertension hypothyroidism mitral valve prolapse and atrial fibrillation, osteoarthritis and asthma. We could  not test her ABIs on either side because of complaints about pain, even the left side doesn't have a wound 04/14/18; she comes in today telling us that the debridement I did last week caused her to vomit when she got home. We are using Iodoflex to the wound as the primary dressing under Kerlix and Coban. She also had complaints about the wrap the heat etc. 04/21/18; using Iodoflex to this traumatic area on the right anterior shin. Arise with a better looking surface although still a difficult amount of tightly adherent fibrinous debris. Objective Constitutional Vitals Time Taken: 3:27 PM, Height: 59 in, Weight: 163.4 lbs, BMI: 33, Temperature: 98.0 F, Pulse: 76 bpm, Respiratory Rate: 18 breaths/min, Blood Pressure: 144/68 mmHg. Integumentary (Hair, Skin) Wound #1 status is Open. Original cause of wound was Trauma. The wound is located on the Right,Midline Lower Leg. The wound measures 3.8cm length x 1cm width x 0.2cm depth; 2.985cm^2 area and 0.597cm^3 volume. There is Fat Layer (Subcutaneous Tissue) Exposed exposed. There is no tunneling or undermining noted. There is a large amount of serosanguineous drainage noted. The wound margin is distinct with the outline attached to the wound base.  There is medium (34-66%) red, pink granulation within the wound bed. There is a medium (34-66%) amount of necrotic tissue within the wound bed including Adherent Slough. The periwound skin appearance did not exhibit: Callus, Crepitus, Excoriation, Induration, Rash, Scarring, Dry/Scaly, Maceration, Atrophie Blanche, Cyanosis, Ecchymosis, Hemosiderin Staining, Mottled, Pallor, Rubor, Erythema. Periwound temperature was noted as No Abnormality. The periwound has tenderness on palpation. Cathy Hines, Cathy Hines ([id number]) Assessment Active Problems ICD-10 Laceration without foreign body, right lower leg, subsequent encounter Non-pressure chronic ulcer of right calf with necrosis of muscle Type 2 diabetes mellitus with other skin ulcer Type 2 diabetes mellitus with diabetic neuropathy, unspecified Procedures Wound #1 Pre-procedure diagnosis of Wound #1 is a Trauma, Other located on the Right,Midline Lower Leg . There was a Excisional Skin/Subcutaneous Tissue Debridement with a total area of 3.8 sq cm performed by Cathy Dillon, MD. With the following instrument(s): Curette to remove Viable and Non-Viable tissue/material. Material removed includes Eschar, Subcutaneous Tissue, and Skin: Dermis after achieving pain control using Other (lidocaine 4%). No specimens were taken. A time out was conducted at 15:50, prior to the start of the procedure. A Moderate amount of bleeding was controlled with Pressure. The procedure was tolerated well with a pain level of 2 throughout and a pain level of 2 following the procedure. Patient s Level of Consciousness post procedure was recorded as Awake and Alert. Post Debridement Measurements: 3.8cm length x 1cm width x 0.3cm depth; 0.895cm^3 volume. Character of Wound/Ulcer Post Debridement requires further debridement. Post procedure Diagnosis Wound #1: Same as Pre-Procedure Plan Wound Cleansing: Wound #1 Right,Midline Lower Leg: Clean wound with Normal  Saline. Anesthetic (add to Medication List): Wound #1 Right,Midline Lower Leg: Topical Lidocaine 4% cream applied to wound bed prior to debridement (In Clinic Only). Primary Wound Dressing: Wound #1 Right,Midline Lower Leg: Iodoflex Secondary Dressing: Kerlix and Coban Wound #1 Right,Midline Lower Leg: ABD pad XtraSorb Dressing Change Frequency: Wound #1 Right,Midline Lower Leg: Change dressing every week Follow-up Appointments: Wound #1 Right,Midline Lower Leg: Return Appointment in 1 week. Cathy Hines, Cathy Hines ([id number]) Nurse Visit as needed Edema Control: Elevate legs to the level of the heart and pump ankles as often as possible #1 I'm going to use Iodoflex for another week. We are putting this in Kerlix and conform #2 may be able change the primary  dressing to Hydrofera Blue next week #3 hopefully no further debridements will be necessary Electronic Signature(s) Signed: 04/21/2018 6:13:14 PM By: Linton Ham MD Entered By: Linton Ham on 04/21/2018 17:10:55 Cathy Hines ([id number]) -------------------------------------------------------------------------------- [facility] Details Patient Name: Cathy Hines Date of Service: 04/21/2018 Medical Record Number: [id number] Patient Account Number: [id number] Date of Birth/Sex: 01-04-1946 (72 y.o. F) Treating RN: Cornell Barman Primary Care Provider: Emily Filbert Other Clinician: Referring Provider: Emily Filbert Treating Provider/Extender: Tito Dine in Treatment: 2 Diagnosis Coding ICD-10 Codes Code Description [phone] Laceration without foreign body, right lower leg, subsequent encounter L97.213 Non-pressure chronic ulcer of right calf with necrosis of muscle E11.622 Type 2 diabetes mellitus with other skin ulcer E11.40 Type 2 diabetes mellitus with diabetic neuropathy, unspecified Facility Procedures CPT4 Code: [id number] Description: 93716 - DEB SUBQ TISSUE 20 SQ CM/< ICD-10 Diagnosis Description  S81.811D Laceration without foreign body, right lower leg, subsequen L97.213 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

## 2018-04-23 NOTE — Progress Notes (Signed)
Cathy Hines, Cathy Hines ([id number]) Visit Report for 04/21/2018 Arrival Information Details Patient Name: Cathy Hines, Cathy Hines Date of Service: 04/21/2018 3:30 PM Medical Record Number: [id number] Patient Account Number: [id number] Date of Birth/Sex: [birth year] (72 y.o. F) Treating RN: Roger Shelter Primary Care Julaine Zimny: Emily Filbert Other Clinician: Referring Brittanee Ghazarian: Emily Filbert Treating Kealani Leckey/Extender: Tito Dine in Treatment: 2 Visit Information History Since Last Visit All ordered tests and consults were completed: No Patient Arrived: Ambulatory Added or deleted any medications: No Arrival Time: 15:26 Any new allergies or adverse reactions: No Accompanied By: daughter Had a fall or experienced change in No Transfer Assistance: None activities of daily living that may affect Patient Identification Verified: Yes risk of falls: Secondary Verification Process Completed: Yes Signs or symptoms of abuse/neglect since last visito No Patient Requires Transmission-Based No Hospitalized since last visit: No Precautions: Implantable device outside of the clinic excluding No Patient Has Alerts: No cellular tissue based products placed in the center since last visit: Pain Present Now: No Electronic Signature(s) Signed: 04/21/2018 5:08:02 PM By: Roger Shelter Entered By: Roger Shelter on 04/21/2018 15:27:21 Cathy Hines ([id number]) -------------------------------------------------------------------------------- Encounter Discharge Information Details Patient Name: Cathy Hines Date of Service: 04/21/2018 3:30 PM Medical Record Number: [id number] Patient Account Number: [id number] Date of Birth/Sex: 1945-12-07 (72 y.o. F) Treating RN: Montey Hora Primary Care Lucius Wise: Emily Filbert Other Clinician: Referring Jaramiah Bossard: Emily Filbert Treating Kimila Papaleo/Extender: Tito Dine in Treatment: 2 Encounter Discharge Information Items Discharge Condition:  Stable Ambulatory Status: Ambulatory Discharge Destination: Home Transportation: Private Auto Accompanied By: sister Schedule Follow-up Appointment: Yes Clinical Summary of Care: Electronic Signature(s) Signed: 04/21/2018 4:28:01 PM By: Montey Hora Entered By: Montey Hora on 04/21/2018 16:28:00 Cathy Hines ([id number]) -------------------------------------------------------------------------------- Lower Extremity Assessment Details Patient Name: Cathy Hines Date of Service: 04/21/2018 3:30 PM Medical Record Number: [id number] Patient Account Number: [id number] Date of Birth/Sex: Mar 15, 1946 (72 y.o. F) Treating RN: Roger Shelter Primary Care Traevion Poehler: Emily Filbert Other Clinician: Referring Uthman Mroczkowski: Emily Filbert Treating Kameryn Davern/Extender: Tito Dine in Treatment: 2 Edema Assessment Assessed: [Left: No] [Right: No] [Left: Edema] [Right: :] Calf Left: Right: Point of Measurement: 30 cm From Medial Instep cm 36.5 cm Ankle Left: Right: Point of Measurement: 8 cm From Medial Instep cm 20.4 cm Vascular Assessment Claudication: Claudication Assessment [Right:None] Pulses: Dorsalis Pedis Palpable: [Right:Yes] Posterior Tibial Extremity colors, hair growth, and conditions: Extremity Color: [Right:Normal] Hair Growth on Extremity: [Right:Yes] Temperature of Extremity: [Right:Warm] Capillary Refill: [Right:< 3 seconds] Toe Nail Assessment Left: Right: Thick: No Discolored: No Deformed: No Improper Length and Hygiene: No Electronic Signature(s) Signed: 04/21/2018 5:08:02 PM By: Roger Shelter Entered By: Roger Shelter on 04/21/2018 15:36:17 Cathy Hines ([id number]) -------------------------------------------------------------------------------- Multi Wound Chart Details Patient Name: Cathy Hines Date of Service: 04/21/2018 3:30 PM Medical Record Number: [id number] Patient Account Number: [id number] Date of Birth/Sex: [birth year] (72  y.o. F) Treating RN: Roger Shelter Primary Care Carron Jaggi: Emily Filbert Other Clinician: Referring Qunicy Higinbotham: Emily Filbert Treating Shaunita Seney/Extender: Tito Dine in Treatment: 2 Vital Signs Height(in): 43 Pulse(bpm): 73 Weight(lbs): 163.4 Blood Pressure(mmHg): 144/68 Body Mass Index(BMI): 33 Temperature(F): 98.0 Respiratory Rate 18 (breaths/min): Photos: [1:No Photos] [N/A:N/A] Wound Location: [1:Right Lower Leg - Midline] [N/A:N/A] Wounding Event: [1:Trauma] [N/A:N/A] Primary Etiology: [1:Trauma, Other] [N/A:N/A] Comorbid History: [1:Cataracts, Chronic sinus problems/congestion, Lymphedema, Asthma, Sleep Apnea, Angina, Arrhythmia, Hypertension, Type II Diabetes, Osteoarthritis, Neuropathy] [N/A:N/A] Date Acquired: [1:03/07/2018] [N/A:N/A] Weeks of Treatment: [1:2] [N/A:N/A] Wound Status: [1:Open] [N/A:N/A] Measurements L x W x D [1:3.8x1x0.2] [N/A:N/A] (  cm) Area (cm) : [1:2.985] [N/A:N/A] Volume (cm) : [1:0.597] [N/A:N/A] % Reduction in Area: [1:62.30%] [N/A:N/A] % Reduction in Volume: [1:74.90%] [N/A:N/A] Classification: [1:Full Thickness Without Exposed Support Structures] [N/A:N/A] Exudate Amount: [1:Large] [N/A:N/A] Exudate Type: [1:Serosanguineous] [N/A:N/A] Exudate Color: [1:red, brown] [N/A:N/A] Wound Margin: [1:Distinct, outline attached] [N/A:N/A] Granulation Amount: [1:Medium (34-66%)] [N/A:N/A] Granulation Quality: [1:Red, Pink] [N/A:N/A] Necrotic Amount: [1:Medium (34-66%)] [N/A:N/A] Exposed Structures: [1:Fat Layer (Subcutaneous Tissue) Exposed: Yes Fascia: No Tendon: No Muscle: No Joint: No Bone: No] [N/A:N/A] Epithelialization: [1:None] [N/A:N/A] Debridement: Debridement - Excisional N/A N/A Pre-procedure 15:50 N/A N/A Verification/Time Out Taken: Pain Control: Other N/A N/A Tissue Debrided: Necrotic/Eschar, N/A N/A Subcutaneous Level: Skin/Subcutaneous Tissue N/A N/A Debridement Area (sq cm): 3.8 N/A N/A Instrument: Curette N/A  N/A Bleeding: Moderate N/A N/A Hemostasis Achieved: Pressure N/A N/A Procedural Pain: 2 N/A N/A Post Procedural Pain: 2 N/A N/A Debridement Treatment Procedure was tolerated well N/A N/A Response: Post Debridement 3.8x1x0.3 N/A N/A Measurements L x W x D (cm) Post Debridement Volume: 0.895 N/A N/A (cm) Periwound Skin Texture: Excoriation: No N/A N/A Induration: No Callus: No Crepitus: No Rash: No Scarring: No Periwound Skin Moisture: Maceration: No N/A N/A Dry/Scaly: No Periwound Skin Color: Atrophie Blanche: No N/A N/A Cyanosis: No Ecchymosis: No Erythema: No Hemosiderin Staining: No Mottled: No Pallor: No Rubor: No Temperature: No Abnormality N/A N/A Tenderness on Palpation: Yes N/A N/A Wound Preparation: Ulcer Cleansing: N/A N/A Rinsed/Irrigated with Saline, Other: soap and water Topical Anesthetic Applied: Other: lidocaine 4% Procedures Performed: Debridement N/A N/A Treatment Notes Wound #1 (Right, Midline Lower Leg) 1. Cleansed with: Clean wound with Normal Saline 2. Anesthetic Topical Lidocaine 4% cream to wound bed prior to debridement 4. Dressing Applied: Iodoflex 5. Secondary Dressing Applied ABD Pad Dry Gauze Cathy Hines, Cathy Hines L. ([id number]) Kerlix/Conform Notes secured lightly with conform and coban Electronic Signature(s) Signed: 04/21/2018 6:13:14 PM By: Linton Ham MD Entered By: Linton Ham on 04/21/2018 17:07:51 Cathy Hines ([id number]) -------------------------------------------------------------------------------- [place] Details Patient Name: Cathy Hines Date of Service: 04/21/2018 3:30 PM Medical Record Number: [id number] Patient Account Number: [id number] Date of Birth/Sex: 09/11/1946 (72 y.o. F) Treating RN: Roger Shelter Primary Care Luetta Piazza: Emily Filbert Other Clinician: Referring Finneus Kaneshiro: Emily Filbert Treating Dalasia Predmore/Extender: Tito Dine in Treatment: 2 Active  Inactive ` Abuse / Safety / Falls / Self Care Management Nursing Diagnoses: History of Falls Goals: Patient will remain injury free related to falls Date Initiated: 04/07/2018 Target Resolution Date: 05/08/2018 Goal Status: Active Interventions: Assess self care needs on admission and as needed Notes: ` Nutrition Nursing Diagnoses: Impaired glucose control: actual or potential Goals: Patient/caregiver will maintain therapeutic glucose control Date Initiated: 04/07/2018 Target Resolution Date: 05/08/2018 Goal Status: Active Interventions: Assess HgA1c results as ordered upon admission and as needed Treatment Activities: Patient referred to Primary Care Physician for further nutritional evaluation : 04/07/2018 Notes: ` Orientation to the Wound Care Program Nursing Diagnoses: Knowledge deficit related to the wound healing center program Goals: Patient/caregiver will verbalize understanding of the [facility] Date Initiated: 04/07/2018 Target Resolution Date: 05/08/2018 Cathy Hines, Cathy Hines ([id number]) Goal Status: Active Interventions: Provide education on orientation to the wound center Notes: ` Soft Tissue Infection Nursing Diagnoses: Potential for infection: soft tissue Goals: Patient will remain free of wound infection Date Initiated: 04/07/2018 Target Resolution Date: 05/08/2018 Goal Status: Active Interventions: Assess signs and symptoms of infection every visit Notes: ` Wound/Skin Impairment Nursing Diagnoses: Impaired tissue integrity Goals: Ulcer/skin breakdown will have a volume reduction of 80% by  week 12 Date Initiated: 04/07/2018 Target Resolution Date: 07/08/2018 Goal Status: Active Interventions: Assess ulceration(s) every visit Treatment Activities: Skin care regimen initiated : 04/07/2018 Topical wound management initiated : 04/07/2018 Notes: Electronic Signature(s) Signed: 04/21/2018 5:08:02 PM By: Roger Shelter Entered By:  Roger Shelter on 04/21/2018 15:51:04 Cathy Hines ([id number]) -------------------------------------------------------------------------------- Pain Assessment Details Patient Name: Cathy Hines Date of Service: 04/21/2018 3:30 PM Medical Record Number: [id number] Patient Account Number: [id number] Date of Birth/Sex: 01/17/1946 (72 y.o. F) Treating RN: Roger Shelter Primary Care Adyn Serna: Emily Filbert Other Clinician: Referring Maybel Dambrosio: Emily Filbert Treating Kinnedy Mongiello/Extender: Tito Dine in Treatment: 2 Active Problems Location of Pain Severity and Description of Pain Patient Has Paino No Site Locations Duration of the Pain. Constant / Intermittento Constant Rate the pain. Current Pain Level: 5 Character of Pain Describe the Pain: Burning Pain Management and Medication Current Pain Management: Electronic Signature(s) Signed: 04/21/2018 5:08:02 PM By: Roger Shelter Entered By: Roger Shelter on 04/21/2018 15:27:50 Cathy Hines ([id number]) -------------------------------------------------------------------------------- Patient/Caregiver Education Details Patient Name: Cathy Hines Date of Service: 04/21/2018 3:30 PM Medical Record Number: [id number] Patient Account Number: [id number] Date of Birth/Gender: [birth year] (72 y.o. F) Treating RN: Montey Hora Primary Care Physician: Emily Filbert Other Clinician: Referring Physician: Emily Filbert Treating Physician/Extender: Tito Dine in Treatment: 2 Education Assessment Education Provided To: Patient Education Topics Provided Wound/Skin Impairment: Handouts: Other: wound care as ordered Methods: Demonstration, Explain/Verbal Responses: State content correctly Electronic Signature(s) Signed: 04/21/2018 5:11:38 PM By: Montey Hora Entered By: Montey Hora on 04/21/2018 16:29:22 Cathy Hines  ([id number]) -------------------------------------------------------------------------------- Wound Assessment Details Patient Name: Cathy Hines Date of Service: 04/21/2018 3:30 PM Medical Record Number: [id number] Patient Account Number: [id number] Date of Birth/Sex: 12/06/1945 (72 y.o. F) Treating RN: Roger Shelter Primary Care Magaline Steinberg: Emily Filbert Other Clinician: Referring Labarron Durnin: Emily Filbert Treating Catalyna Reilly/Extender: Tito Dine in Treatment: 2 Wound Status Wound Number: 1 Primary Trauma, Other Etiology: Wound Location: Right Lower Leg - Midline Wound Open Wounding Event: Trauma Status: Date Acquired: 03/07/2018 Comorbid Cataracts, Chronic sinus problems/congestion, Weeks Of Treatment: 2 History: Lymphedema, Asthma, Sleep Apnea, Angina, Clustered Wound: No Arrhythmia, Hypertension, Type II Diabetes, Osteoarthritis, Neuropathy Photos Photo Uploaded By: Roger Shelter on 04/21/2018 17:18:29 Wound Measurements Length: (cm) 3.8 Width: (cm) 1 Depth: (cm) 0.2 Area: (cm) 2.985 Volume: (cm) 0.597 % Reduction in Area: 62.3% % Reduction in Volume: 74.9% Epithelialization: None Tunneling: No Undermining: No Wound Description Full Thickness Without Exposed Support Classification: Structures Wound Margin: Distinct, outline attached Exudate Large Amount: Exudate Type: Serosanguineous Exudate Color: red, brown Foul Odor After Cleansing: No Slough/Fibrino Yes Wound Bed Granulation Amount: Medium (34-66%) Exposed Structure Granulation Quality: Red, Pink Fascia Exposed: No Necrotic Amount: Medium (34-66%) Fat Layer (Subcutaneous Tissue) Exposed: Yes Necrotic Quality: Adherent Slough Tendon Exposed: No Muscle Exposed: No Joint Exposed: No Cathy Hines, Cathy L. ([id number]) Bone Exposed: No Periwound Skin Texture Texture Color No Abnormalities Noted: No No Abnormalities Noted: No Callus: No Atrophie Blanche: No Crepitus: No Cyanosis:  No Excoriation: No Ecchymosis: No Induration: No Erythema: No Rash: No Hemosiderin Staining: No Scarring: No Mottled: No Pallor: No Moisture Rubor: No No Abnormalities Noted: No Dry / Scaly: No Temperature / Pain Maceration: No Temperature: No Abnormality Tenderness on Palpation: Yes Wound Preparation Ulcer Cleansing: Rinsed/Irrigated with Saline, Other: soap and water, Topical Anesthetic Applied: Other: lidocaine 4%, Treatment Notes Wound #1 (Right, Midline Lower Leg) 1. Cleansed with: Clean wound with Normal Saline 2. Anesthetic Topical Lidocaine 4% cream to wound bed  prior to debridement 4. Dressing Applied: Iodoflex 5. Secondary Dressing Applied ABD Pad Dry Gauze Kerlix/Conform Notes secured lightly with conform and coban Electronic Signature(s) Signed: 04/21/2018 5:08:02 PM By: Roger Shelter Entered By: Roger Shelter on 04/21/2018 15:34:15 Cathy Hines ([id number]) -------------------------------------------------------------------------------- Vitals Details Patient Name: Cathy Hines Date of Service: 04/21/2018 3:30 PM Medical Record Number: [id number] Patient Account Number: [id number] Date of Birth/Sex: 09/27/1946 (72 y.o. F) Treating RN: Roger Shelter Primary Care Jaliza Seifried: Emily Filbert Other Clinician: Referring Buffi Ewton: Emily Filbert Treating Lyncoln Maskell/Extender: Tito Dine in Treatment: 2 Vital Signs Time Taken: 15:27 Temperature (F): 98.0 Height (in): 59 Pulse (bpm): 76 Weight (lbs): 163.4 Respiratory Rate (breaths/min): 18 Body Mass Index (BMI): 33 Blood Pressure (mmHg): 144/68 Reference Range: 80 - 120 mg / dl Electronic Signature(s) Signed: 04/21/2018 5:08:02 PM By: Roger Shelter Entered By: Roger Shelter on 04/21/2018 15:32:23

## 2018-04-27 DIAGNOSIS — S81801A Unspecified open wound, right lower leg, initial encounter: Secondary | ICD-10-CM | POA: Diagnosis not present

## 2018-04-27 DIAGNOSIS — A084 Viral intestinal infection, unspecified: Secondary | ICD-10-CM | POA: Diagnosis not present

## 2018-04-28 ENCOUNTER — Encounter: Payer: Medicare HMO | Admitting: Internal Medicine

## 2018-04-28 DIAGNOSIS — E1151 Type 2 diabetes mellitus with diabetic peripheral angiopathy without gangrene: Secondary | ICD-10-CM | POA: Diagnosis not present

## 2018-04-28 DIAGNOSIS — E785 Hyperlipidemia, unspecified: Secondary | ICD-10-CM | POA: Diagnosis not present

## 2018-04-28 DIAGNOSIS — I4891 Unspecified atrial fibrillation: Secondary | ICD-10-CM | POA: Diagnosis not present

## 2018-04-28 DIAGNOSIS — I10 Essential (primary) hypertension: Secondary | ICD-10-CM | POA: Diagnosis not present

## 2018-04-28 DIAGNOSIS — E039 Hypothyroidism, unspecified: Secondary | ICD-10-CM | POA: Diagnosis not present

## 2018-04-28 DIAGNOSIS — E11622 Type 2 diabetes mellitus with other skin ulcer: Secondary | ICD-10-CM | POA: Diagnosis not present

## 2018-04-28 DIAGNOSIS — S81811A Laceration without foreign body, right lower leg, initial encounter: Secondary | ICD-10-CM | POA: Diagnosis not present

## 2018-04-28 DIAGNOSIS — E1140 Type 2 diabetes mellitus with diabetic neuropathy, unspecified: Secondary | ICD-10-CM | POA: Diagnosis not present

## 2018-04-28 DIAGNOSIS — I341 Nonrheumatic mitral (valve) prolapse: Secondary | ICD-10-CM | POA: Diagnosis not present

## 2018-04-28 DIAGNOSIS — L97213 Non-pressure chronic ulcer of right calf with necrosis of muscle: Secondary | ICD-10-CM | POA: Diagnosis not present

## 2018-05-01 NOTE — Progress Notes (Signed)
Cathy Hines, Cathy Hines ([id number]) Visit Report for 04/28/2018 Debridement Details Patient Name: Cathy Hines, Cathy Hines Date of Service: 04/28/2018 11:00 AM Medical Record Number: [id number] Patient Account Number: [id number] Date of Birth/Sex: Sep 27, 1946 (72 y.o. F) Treating RN: Cornell Barman Primary Care Provider: Emily Filbert Other Clinician: Referring Provider: Emily Filbert Treating Provider/Extender: Tito Dine in Treatment: 3 Debridement Performed for Wound #1 Right,Midline Lower Leg Assessment: Performed By: Physician Ricard Dillon, MD Debridement Type: Debridement Pre-procedure Verification/Time Yes - 11:30 Out Taken: Start Time: 11:31 Pain Control: Other : lidocaine 4% Total Area Debrided (L x W): 2.8 (cm) x 0.7 (cm) = 1.96 (cm) Tissue and other material Viable, Non-Viable, Slough, Subcutaneous, Slough debrided: Level: Skin/Subcutaneous Tissue Debridement Description: Excisional Instrument: Curette Bleeding: Minimum Hemostasis Achieved: Pressure End Time: 11:32 Procedural Pain: 0 Post Procedural Pain: 0 Response to Treatment: Procedure was tolerated well Level of Consciousness: Awake and Alert Post Debridement Measurements of Total Wound Length: (cm) 2.8 Width: (cm) 0.7 Depth: (cm) 0.1 Volume: (cm) 0.154 Character of Wound/Ulcer Post Debridement: Requires Further Debridement Post Procedure Diagnosis Same as Pre-procedure Electronic Signature(s) Signed: 04/28/2018 4:49:36 PM By: Linton Ham MD Signed: 04/29/2018 8:47:48 AM By: Gretta Cool, BSN, RN, CWS, Kim RN, BSN Entered By: Linton Ham on 04/28/2018 12:20:05 Cathy Hines ([id number]) -------------------------------------------------------------------------------- HPI Details Patient Name: Cathy Hines Date of Service: 04/28/2018 11:00 AM Medical Record Number: [id number] Patient Account Number: [id number] Date of Birth/Sex: [birth year] (72 y.o. F) Treating RN: Cornell Barman Primary Care  Provider: Emily Filbert Other Clinician: Referring Provider: Emily Filbert Treating Provider/Extender: Tito Dine in Treatment: 3 History of Present Illness HPI Description: 04/07/18 ADMISSION This is a 72 year old woman with type 2 diabetes and a history of diabetic neuropathy. About a month ago she fell while walking in her yard and suffered a laceration of her right anterior shin over the tibia. She tried to re-adhere the skin flaps. She went to see her primary physician on 03/11/18 was given antibiotic ointment, Telfa and Coban and was prescribed doxycycline. I believe she had an x-ray ordered that did not show up particular abnormality. On 03/15/18 Keflex was added. Most recently she has been putting Betadine on this and Vaseline and was told to soak this in Epson salts. She does not have a wound history. She has type 2 diabetes with neuropathy, hyperlipidemia hypertension hypothyroidism mitral valve prolapse and atrial fibrillation, osteoarthritis and asthma. We could not test her ABIs on either side because of complaints about pain, even the left side doesn't have a wound 04/14/18; she comes in today telling us that the debridement I did last week caused her to vomit when she got home. We are using Iodoflex to the wound as the primary dressing under Kerlix and Coban. She also had complaints about the wrap the heat etc. 04/21/18; using Iodoflex to this traumatic area on the right anterior shin. Arise with a better looking surface although still a difficult amount of tightly adherent fibrinous debris. 04/28/18; were using to flex to the traumatic area on her right anterior shin. The wound is contracted. She does not want compression on today. Still some adherent fibrinous debris over the surface of this. Electronic Signature(s) Signed: 04/28/2018 4:49:36 PM By: Linton Ham MD Entered By: Linton Ham on 04/28/2018 12:21:45 Cathy Hines  ([id number]) -------------------------------------------------------------------------------- Physical Exam Details Patient Name: Cathy Hines Date of Service: 04/28/2018 11:00 AM Medical Record Number: [id number] Patient Account Number: [id number] Date of Birth/Sex: 09-04-1946 (72 y.o. F) Treating RN: Cornell Barman  Primary Care Provider: Emily Filbert Other Clinician: Referring Provider: Emily Filbert Treating Provider/Extender: Tito Dine in Treatment: 3 Notes wound exam; linear wound on the right anterior tibia. Again debrided with a #3 curet to remove adherent fibrinous debris although there appears to be epithelialization and reduction in overall wound area. She does not have a lot in the way of edema. Electronic Signature(s) Signed: 04/28/2018 4:49:36 PM By: Linton Ham MD Entered By: Linton Ham on 04/28/2018 12:22:56 Cathy Hines ([id number]) -------------------------------------------------------------------------------- Physician Orders Details Patient Name: Cathy Hines Date of Service: 04/28/2018 11:00 AM Medical Record Number: [id number] Patient Account Number: [id number] Date of Birth/Sex: [birth year] (72 y.o. F) Treating RN: Cornell Barman Primary Care Provider: Emily Filbert Other Clinician: Referring Provider: Emily Filbert Treating Provider/Extender: Tito Dine in Treatment: 3 Verbal / Phone Orders: No Diagnosis Coding Wound Cleansing Wound #1 Right,Midline Lower Leg o Clean wound with Normal Saline. Anesthetic (add to Medication List) Wound #1 Right,Midline Lower Leg o Topical Lidocaine 4% cream applied to wound bed prior to debridement (In Clinic Only). Primary Wound Dressing Wound #1 Right,Midline Lower Leg o Hydrafera Blue Ready Transfer Secondary Dressing Wound #1 Right,Midline Lower Leg o Telfa [place] Dressing Change Frequency Wound #1 Right,Midline Lower Leg o Change dressing every other day. Follow-up  Appointments Wound #1 Right,Midline Lower Leg o Return Appointment in 1 week. Edema Control o Elevate legs to the level of the heart and pump ankles as often as possible Electronic Signature(s) Signed: 04/28/2018 4:49:36 PM By: Linton Ham MD Signed: 04/29/2018 8:47:48 AM By: Gretta Cool, BSN, RN, CWS, Kim RN, BSN Entered By: Gretta Cool, BSN, RN, CWS, Kim on 04/28/2018 11:33:20 Cathy Hines, Cathy Hines ([id number]) -------------------------------------------------------------------------------- Problem List Details Patient Name: Cathy Hines Date of Service: 04/28/2018 11:00 AM Medical Record Number: [id number] Patient Account Number: [id number] Date of Birth/Sex: 1946/08/16 (72 y.o. 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 04/07/2018 No Yes muscle E11.622 Type 2 diabetes mellitus with other skin ulcer 04/07/2018 No Yes E11.40 Type 2 diabetes mellitus with diabetic neuropathy, 04/07/2018 No Yes unspecified Inactive Problems Resolved Problems Electronic Signature(s) Signed: 04/28/2018 4:49:36 PM By: Linton Ham MD Entered By: Linton Ham on 04/28/2018 12:19:11 Cathy Hines ([id number]) -------------------------------------------------------------------------------- Progress Note Details Patient Name: Cathy Hines Date of Service: 04/28/2018 11:00 AM Medical Record Number: [id number] Patient Account Number: [id number] Date of Birth/Sex: October 21, 1946 (72 y.o. F) Treating RN: Cornell Barman Primary Care Provider: Emily Filbert Other Clinician: Referring Provider: Emily Filbert Treating  Provider/Extender: Tito Dine in Treatment: 3 Subjective History of Present Illness (HPI) 04/07/18 ADMISSION This is a 71 year old woman with type 2 diabetes and a history of diabetic neuropathy. About a month ago she fell while walking in her yard and suffered a laceration of her right anterior shin over the tibia. She tried to re-adhere the skin flaps. She went to see her primary physician on 03/11/18 was given antibiotic ointment, Telfa and Coban and was prescribed doxycycline. I believe she had an x-ray ordered that did not show up particular abnormality. On 03/15/18 Keflex was added. Most recently she has been putting Betadine on this and Vaseline and was told to soak this in Epson salts. She does  not have a wound history. She has type 2 diabetes with neuropathy, hyperlipidemia hypertension hypothyroidism mitral valve prolapse and atrial fibrillation, osteoarthritis and asthma. We could not test her ABIs on either side because of complaints about pain, even the left side doesn't have a wound 04/14/18; she comes in today telling us that the debridement I did last week caused her to vomit when she got home. We are using Iodoflex to the wound as the primary dressing under Kerlix and Coban. She also had complaints about the wrap the heat etc. 04/21/18; using Iodoflex to this traumatic area on the right anterior shin. Arise with a better looking surface although still a difficult amount of tightly adherent fibrinous debris. 04/28/18; were using to flex to the traumatic area on her right anterior shin. The wound is contracted. She does not want compression on today. Still some adherent fibrinous debris over the surface of this. Objective Constitutional Vitals Time Taken: 10:51 AM, Height: 59 in, Weight: 163.4 lbs, BMI: 33, Temperature: 98.0 F, Pulse: 62 bpm, Respiratory Rate: 18 breaths/min, Blood Pressure: 149/65 mmHg. Integumentary (Hair, Skin) Wound #1 status is Open. Original  cause of wound was Trauma. The wound is located on the Right,Midline Lower Leg. The wound measures 2.8cm length x 0.7cm width x 0.1cm depth; 1.539cm^2 area and 0.154cm^3 volume. There is Fat Layer (Subcutaneous Tissue) Exposed exposed. There is no tunneling or undermining noted. There is a large amount of serosanguineous drainage noted. The wound margin is distinct with the outline attached to the wound base. There is medium (34-66%) red, pink granulation within the wound bed. There is a medium (34-66%) amount of necrotic tissue within the wound bed including Adherent Slough. The periwound skin appearance did not exhibit: Callus, Crepitus, Excoriation, Induration, Rash, Scarring, Dry/Scaly, Maceration, Atrophie Blanche, Cyanosis, Ecchymosis, Hemosiderin Staining, Mottled, Pallor, Rubor, Erythema. Periwound temperature was noted as No Abnormality. The periwound has tenderness on palpation. Cathy Hines, Cathy Hines ([id number]) Assessment Active Problems ICD-10 Laceration without foreign body, right lower leg, subsequent encounter Non-pressure chronic ulcer of right calf with necrosis of muscle Type 2 diabetes mellitus with other skin ulcer Type 2 diabetes mellitus with diabetic neuropathy, unspecified Procedures Wound #1 Pre-procedure diagnosis of Wound #1 is a Trauma, Other located on the Right,Midline Lower Leg . There was a Excisional Skin/Subcutaneous Tissue Debridement with a total area of 1.96 sq cm performed by Ricard Dillon, MD. With the following instrument(s): Curette to remove Viable and Non-Viable tissue/material. Material removed includes Subcutaneous Tissue and Slough and after achieving pain control using Other (lidocaine 4%). No specimens were taken. A time out was conducted at 11:30, prior to the start of the procedure. A Minimum amount of bleeding was controlled with Pressure. The procedure was tolerated well with a pain level of 0 throughout and a pain level of 0 following the  procedure. Patient s Level of Consciousness post procedure was recorded as Awake and Alert. Post Debridement Measurements: 2.8cm length x 0.7cm width x 0.1cm depth; 0.154cm^3 volume. Character of Wound/Ulcer Post Debridement requires further debridement. Post procedure Diagnosis Wound #1: Same as Pre-Procedure Plan Wound Cleansing: Wound #1 Right,Midline Lower Leg: Clean wound with Normal Saline. Anesthetic (add to Medication List): Wound #1 Right,Midline Lower Leg: Topical Lidocaine 4% cream applied to wound bed prior to debridement (In Clinic Only). Primary Wound Dressing: Wound #1 Right,Midline Lower Leg: Hydrafera Blue Ready Transfer Secondary Dressing: Wound #1 Right,Midline Lower Leg: Telfa [place] Dressing Change Frequency: Wound #1 Right,Midline Lower Leg: Change dressing every other day. Follow-up  Appointments: Wound #1 Right,Midline Lower Leg: Return Appointment in 1 week. Edema Control: Elevate legs to the level of the heart and pump ankles as often as possible Cathy Hines, Cathy L. ([id number]) #1 I changed her to [facility] ready under Telfa. She is going to change this dressing every other day #2 she does not have much in the way of edema however the edema returns we'll need to rethink the noncompression. Electronic Signature(s) Signed: 04/28/2018 4:49:36 PM By: Linton Ham MD Entered By: Linton Ham on 04/28/2018 12:24:34 Cathy Hines ([id number]) -------------------------------------------------------------------------------- Badger Details Patient Name: Cathy Hines Date of Service: 04/28/2018 Medical Record Number: [id number] Patient Account Number: [id number] Date of Birth/Sex: 10/29/1946 (72 y.o. F) Treating RN: Cornell Barman Primary Care Provider: Emily Filbert Other Clinician: Referring Provider: Emily Filbert Treating Provider/Extender: Tito Dine in Treatment: 3 Diagnosis Coding ICD-10 Codes Code Description [phone]  Laceration without foreign body, right lower leg, subsequent encounter L97.213 Non-pressure chronic ulcer of right calf with necrosis of muscle E11.622 Type 2 diabetes mellitus with other skin ulcer E11.40 Type 2 diabetes mellitus with diabetic neuropathy, unspecified Facility Procedures CPT4 Code: [id number] Description: 88325 - DEB SUBQ TISSUE 20 SQ CM/< ICD-10 Diagnosis Description S81.811D Laceration without foreign body, right lower leg, subsequen L97.213 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

## 2018-05-01 NOTE — Progress Notes (Signed)
Cathy Hines, Cathy Hines ([id number]) Visit Report for 04/28/2018 Arrival Information Details Patient Name: Cathy Hines, Cathy Hines Date of Service: 04/28/2018 11:00 AM Medical Record Number: [id number] Patient Account Number: [id number] Date of Birth/Sex: 08-11-1946 (72 y.o. F) Treating RN: Ahmed Prima Primary Care Rajveer Handler: Emily Filbert Other Clinician: Referring Kyro Joswick: Emily Filbert Treating Deshawn Witty/Extender: Tito Dine in Treatment: 3 Visit Information History Since Last Visit All ordered tests and consults were completed: No Patient Arrived: Ambulatory Added or deleted any medications: No Arrival Time: 10:51 Any new allergies or adverse reactions: No Accompanied By: self Had a fall or experienced change in No Transfer Assistance: None activities of daily living that may affect Patient Identification Verified: Yes risk of falls: Secondary Verification Process Completed: Yes Signs or symptoms of abuse/neglect since last visito No Patient Requires Transmission-Based No Hospitalized since last visit: No Precautions: Implantable device outside of the clinic excluding No Patient Has Alerts: No cellular tissue based products placed in the center since last visit: Has Dressing in Place as Prescribed: Yes Pain Present Now: No Electronic Signature(s) Signed: 04/28/2018 3:54:16 PM By: Alric Quan Entered By: Alric Quan on 04/28/2018 10:51:40 Cathy Hines ([id number]) -------------------------------------------------------------------------------- Encounter Discharge Information Details Patient Name: Cathy Hines Date of Service: 04/28/2018 11:00 AM Medical Record Number: [id number] Patient Account Number: [id number] Date of Birth/Sex: [birth year] (72 y.o. F) Treating RN: Roger Shelter Primary Care Jolena Kittle: Emily Filbert Other Clinician: Referring Marquita Lias: Emily Filbert Treating Chavez Rosol/Extender: Tito Dine in Treatment: 3 Encounter  Discharge Information Items Discharge Condition: Stable Ambulatory Status: Ambulatory Discharge Destination: Home Transportation: Private Auto Schedule Follow-up Appointment: Yes Clinical Summary of Care: Electronic Signature(s) Signed: 04/28/2018 4:05:56 PM By: Roger Shelter Entered By: Roger Shelter on 04/28/2018 11:50:34 Cathy Hines ([id number]) -------------------------------------------------------------------------------- Lower Extremity Assessment Details Patient Name: Cathy Hines Date of Service: 04/28/2018 11:00 AM Medical Record Number: [id number] Patient Account Number: [id number] Date of Birth/Sex: 06/13/1946 (72 y.o. F) Treating RN: Ahmed Prima Primary Care Renton Berkley: Emily Filbert Other Clinician: Referring Dantae Meunier: Emily Filbert Treating Dawnetta Copenhaver/Extender: Tito Dine in Treatment: 3 Edema Assessment Assessed: [Left: No] [Right: No] [Left: Edema] [Right: :] Calf Left: Right: Point of Measurement: 30 cm From Medial Instep cm 37 cm Ankle Left: Right: Point of Measurement: 8 cm From Medial Instep cm 19.5 cm Vascular Assessment Pulses: Dorsalis Pedis Palpable: [Right:Yes] Posterior Tibial Extremity colors, hair growth, and conditions: Extremity Color: [Right:Normal] Temperature of Extremity: [Right:Warm] Capillary Refill: [Right:< 3 seconds] Toe Nail Assessment Left: Right: Thick: No Discolored: No Deformed: No Improper Length and Hygiene: Yes Electronic Signature(s) Signed: 04/28/2018 3:54:16 PM By: Alric Quan Entered By: Alric Quan on 04/28/2018 11:00:01 Cathy Hines ([id number]) -------------------------------------------------------------------------------- Multi Wound Chart Details Patient Name: Cathy Hines Date of Service: 04/28/2018 11:00 AM Medical Record Number: [id number] Patient Account Number: [id number] Date of Birth/Sex: [birth year] (72 y.o. F) Treating RN: Cornell Barman Primary Care Lilyanna Lunt:  Emily Filbert Other Clinician: Referring Royal Vandevoort: Emily Filbert Treating Reianna Batdorf/Extender: Tito Dine in Treatment: 3 Vital Signs Height(in): 6 Pulse(bpm): 39 Weight(lbs): 163.4 Blood Pressure(mmHg): 149/65 Body Mass Index(BMI): 33 Temperature(F): 98.0 Respiratory Rate 18 (breaths/min): Photos: [1:No Photos] [N/A:N/A] Wound Location: [1:Right Lower Leg - Midline] [N/A:N/A] Wounding Event: [1:Trauma] [N/A:N/A] Primary Etiology: [1:Trauma, Other] [N/A:N/A] Comorbid History: [1:Cataracts, Chronic sinus problems/congestion, Lymphedema, Asthma, Sleep Apnea, Angina, Arrhythmia, Hypertension, Type II Diabetes, Osteoarthritis, Neuropathy] [N/A:N/A] Date Acquired: [1:03/07/2018] [N/A:N/A] Weeks of Treatment: [1:3] [N/A:N/A] Wound Status: [1:Open] [N/A:N/A] Measurements L x W x D [1:2.8x0.7x0.1] [N/A:N/A] (cm) Area (cm) : [1:1.539] [  N/A:N/A] Volume (cm) : [1:0.154] [N/A:N/A] % Reduction in Area: [1:80.60%] [N/A:N/A] % Reduction in Volume: [1:93.50%] [N/A:N/A] Classification: [1:Full Thickness Without Exposed Support Structures] [N/A:N/A] Exudate Amount: [1:Large] [N/A:N/A] Exudate Type: [1:Serosanguineous] [N/A:N/A] Exudate Color: [1:red, brown] [N/A:N/A] Wound Margin: [1:Distinct, outline attached] [N/A:N/A] Granulation Amount: [1:Medium (34-66%)] [N/A:N/A] Granulation Quality: [1:Red, Pink] [N/A:N/A] Necrotic Amount: [1:Medium (34-66%)] [N/A:N/A] Exposed Structures: [1:Fat Layer (Subcutaneous Tissue) Exposed: Yes Fascia: No Tendon: No Muscle: No Joint: No Bone: No] [N/A:N/A] Epithelialization: [1:None] [N/A:N/A] Debridement: Debridement - Excisional N/A N/A Pre-procedure 11:30 N/A N/A Verification/Time Out Taken: Pain Control: Other N/A N/A Tissue Debrided: Subcutaneous, Slough N/A N/A Level: Skin/Subcutaneous Tissue N/A N/A Debridement Area (sq cm): 1.96 N/A N/A Instrument: Curette N/A N/A Bleeding: Minimum N/A N/A Hemostasis Achieved: Pressure N/A  N/A Procedural Pain: 0 N/A N/A Post Procedural Pain: 0 N/A N/A Debridement Treatment Procedure was tolerated well N/A N/A Response: Post Debridement 2.8x0.7x0.1 N/A N/A Measurements L x W x D (cm) Post Debridement Volume: 0.154 N/A N/A (cm) Periwound Skin Texture: Excoriation: No N/A N/A Induration: No Callus: No Crepitus: No Rash: No Scarring: No Periwound Skin Moisture: Maceration: No N/A N/A Dry/Scaly: No Periwound Skin Color: Atrophie Blanche: No N/A N/A Cyanosis: No Ecchymosis: No Erythema: No Hemosiderin Staining: No Mottled: No Pallor: No Rubor: No Temperature: No Abnormality N/A N/A Tenderness on Palpation: Yes N/A N/A Wound Preparation: Ulcer Cleansing: N/A N/A Rinsed/Irrigated with Saline, Other: soap and water Topical Anesthetic Applied: Other: lidocaine 4% Procedures Performed: Debridement N/A N/A Treatment Notes Wound #1 (Right, Midline Lower Leg) 1. Cleansed with: Clean wound with Normal Saline 2. Anesthetic Topical Lidocaine 4% cream to wound bed prior to debridement 4. Dressing Applied: Hydrafera Blue 5. Secondary Dressing Applied Bordered Foam Dressing TYKISHA, AREOLA ([id number]) Electronic Signature(s) Signed: 04/28/2018 4:49:36 PM By: Linton Ham MD Entered By: Linton Ham on 04/28/2018 12:19:25 Cathy Hines ([id number]) -------------------------------------------------------------------------------- [place] Details Patient Name: Cathy Hines Date of Service: 04/28/2018 11:00 AM Medical Record Number: [id number] Patient Account Number: [id number] Date of Birth/Sex: 26-Jul-1946 (72 y.o. F) Treating RN: Cornell Barman Primary Care Ramsha Lonigro: Emily Filbert Other Clinician: Referring Chyla Schlender: Emily Filbert Treating Mollee Neer/Extender: Tito Dine in Treatment: 3 Active Inactive ` Abuse / Safety / Falls / Self Care Management Nursing Diagnoses: History of Falls Goals: Patient will remain  injury free related to falls Date Initiated: 04/07/2018 Target Resolution Date: 05/08/2018 Goal Status: Active Interventions: Assess self care needs on admission and as needed Notes: ` Nutrition Nursing Diagnoses: Impaired glucose control: actual or potential Goals: Patient/caregiver will maintain therapeutic glucose control Date Initiated: 04/07/2018 Target Resolution Date: 05/08/2018 Goal Status: Active Interventions: Assess HgA1c results as ordered upon admission and as needed Treatment Activities: Patient referred to Primary Care Physician for further nutritional evaluation : 04/07/2018 Notes: ` Orientation to the Wound Care Program Nursing Diagnoses: Knowledge deficit related to the wound healing center program Goals: Patient/caregiver will verbalize understanding of the [place] Date Initiated: 04/07/2018 Target Resolution Date: 05/08/2018 ALAINAH, PHANG ([id number]) Goal Status: Active Interventions: Provide education on orientation to the wound center Notes: ` Soft Tissue Infection Nursing Diagnoses: Potential for infection: soft tissue Goals: Patient will remain free of wound infection Date Initiated: 04/07/2018 Target Resolution Date: 05/08/2018 Goal Status: Active Interventions: Assess signs and symptoms of infection every visit Notes: ` Wound/Skin Impairment Nursing Diagnoses: Impaired tissue integrity Goals: Ulcer/skin breakdown will have a volume reduction of 80% by week 12 Date Initiated: 04/07/2018 Target Resolution Date: 07/08/2018 Goal Status: Active Interventions:  Assess ulceration(s) every visit Treatment Activities: Skin care regimen initiated : 04/07/2018 Topical wound management initiated : 04/07/2018 Notes: Electronic Signature(s) Signed: 04/29/2018 8:47:48 AM By: Gretta Cool, BSN, RN, CWS, Kim RN, BSN Entered By: Gretta Cool, BSN, RN, CWS, Kim on 04/28/2018 11:30:32 Cathy Hines  ([id number]) -------------------------------------------------------------------------------- Pain Assessment Details Patient Name: Cathy Hines Date of Service: 04/28/2018 11:00 AM Medical Record Number: [id number] Patient Account Number: [id number] Date of Birth/Sex: 16-Mar-1946 (72 y.o. F) Treating RN: Ahmed Prima Primary Care Jayron Maqueda: Emily Filbert Other Clinician: Referring Kenyatta Gloeckner: Emily Filbert Treating Javis Abboud/Extender: Tito Dine in Treatment: 3 Active Problems Location of Pain Severity and Description of Pain Patient Has Paino No Site Locations Pain Management and Medication Current Pain Management: Electronic Signature(s) Signed: 04/28/2018 3:54:16 PM By: Alric Quan Entered By: Alric Quan on 04/28/2018 10:51:47 Cathy Hines ([id number]) -------------------------------------------------------------------------------- Patient/Caregiver Education Details Patient Name: Cathy Hines Date of Service: 04/28/2018 11:00 AM Medical Record Number: [id number] Patient Account Number: [id number] Date of Birth/Gender: 1946-01-24 (72 y.o. F) Treating RN: Roger Shelter Primary Care Physician: Emily Filbert Other Clinician: Referring Physician: Emily Filbert Treating Physician/Extender: Tito Dine in Treatment: 3 Education Assessment Education Provided To: Patient Education Topics Provided Wound Debridement: Handouts: Wound Debridement Methods: Explain/Verbal Responses: State content correctly Wound/Skin Impairment: Handouts: Caring for Your Ulcer Methods: Explain/Verbal Responses: State content correctly Electronic Signature(s) Signed: 04/28/2018 4:05:56 PM By: Roger Shelter Entered By: Roger Shelter on 04/28/2018 11:50:51 Cathy Hines ([id number]) -------------------------------------------------------------------------------- Wound Assessment Details Patient Name: Cathy Hines Date of Service: 04/28/2018  11:00 AM Medical Record Number: [id number] Patient Account Number: [id number] Date of Birth/Sex: 1946-01-29 (72 y.o. F) Treating RN: Ahmed Prima Primary Care Tija Biss: Emily Filbert Other Clinician: Referring Louella Medaglia: Emily Filbert Treating Yardley Beltran/Extender: Tito Dine in Treatment: 3 Wound Status Wound Number: 1 Primary Trauma, Other Etiology: Wound Location: Right Lower Leg - Midline Wound Open Wounding Event: Trauma Status: Date Acquired: 03/07/2018 Comorbid Cataracts, Chronic sinus problems/congestion, Weeks Of Treatment: 3 History: Lymphedema, Asthma, Sleep Apnea, Angina, Clustered Wound: No Arrhythmia, Hypertension, Type II Diabetes, Osteoarthritis, Neuropathy Photos Photo Uploaded By: Alric Quan on 04/28/2018 15:50:18 Wound Measurements Length: (cm) 2.8 Width: (cm) 0.7 Depth: (cm) 0.1 Area: (cm) 1.539 Volume: (cm) 0.154 % Reduction in Area: 80.6% % Reduction in Volume: 93.5% Epithelialization: None Tunneling: No Undermining: No Wound Description Full Thickness Without Exposed Support Classification: Structures Wound Margin: Distinct, outline attached Exudate Large Amount: Exudate Type: Serosanguineous Exudate Color: red, brown Foul Odor After Cleansing: No Slough/Fibrino Yes Wound Bed Granulation Amount: Medium (34-66%) Exposed Structure Granulation Quality: Red, Pink Fascia Exposed: No Necrotic Amount: Medium (34-66%) Fat Layer (Subcutaneous Tissue) Exposed: Yes Necrotic Quality: Adherent Slough Tendon Exposed: No Muscle Exposed: No Joint Exposed: No Bone Exposed: No Periwound Skin Texture Thoreson, Liviana L. ([id number]) Texture Color No Abnormalities Noted: No No Abnormalities Noted: No Callus: No Atrophie Blanche: No Crepitus: No Cyanosis: No Excoriation: No Ecchymosis: No Induration: No Erythema: No Rash: No Hemosiderin Staining: No Scarring: No Mottled: No Pallor: No Moisture Rubor: No No Abnormalities  Noted: No Dry / Scaly: No Temperature / Pain Maceration: No Temperature: No Abnormality Tenderness on Palpation: Yes Wound Preparation Ulcer Cleansing: Rinsed/Irrigated with Saline, Other: soap and water, Topical Anesthetic Applied: Other: lidocaine 4%, Treatment Notes Wound #1 (Right, Midline Lower Leg) 1. Cleansed with: Clean wound with Normal Saline 2. Anesthetic Topical Lidocaine 4% cream to wound bed prior to debridement 4. Dressing Applied: Hydrafera Blue 5. Secondary Dressing Applied Bordered Foam Dressing Electronic Signature(s) Signed:  04/28/2018 3:54:16 PM By: Alric Quan Entered By: Alric Quan on 04/28/2018 10:58:20 Cathy Hines ([id number]) -------------------------------------------------------------------------------- [place] Details Patient Name: Cathy Hines Date of Service: 04/28/2018 11:00 AM Medical Record Number: [id number] Patient Account Number: [id number] Date of Birth/Sex: [birth year] (72 y.o. F) Treating RN: Ahmed Prima Primary Care Jackie Littlejohn: Emily Filbert Other Clinician: Referring Cinthia Rodden: Emily Filbert Treating Carrye Goller/Extender: Tito Dine in Treatment: 3 Vital Signs Time Taken: 10:51 Temperature (F): 98.0 Height (in): 59 Pulse (bpm): 62 Weight (lbs): 163.4 Respiratory Rate (breaths/min): 18 Body Mass Index (BMI): 33 Blood Pressure (mmHg): 149/65 Reference Range: 80 - 120 mg / dl Electronic Signature(s) Signed: 04/28/2018 3:54:16 PM By: Alric Quan Entered By: Alric Quan on 04/28/2018 10:54:52

## 2018-05-05 ENCOUNTER — Encounter: Payer: Medicare HMO | Admitting: Internal Medicine

## 2018-05-05 DIAGNOSIS — I4891 Unspecified atrial fibrillation: Secondary | ICD-10-CM | POA: Diagnosis not present

## 2018-05-05 DIAGNOSIS — S81811A Laceration without foreign body, right lower leg, initial encounter: Secondary | ICD-10-CM | POA: Diagnosis not present

## 2018-05-05 DIAGNOSIS — E1151 Type 2 diabetes mellitus with diabetic peripheral angiopathy without gangrene: Secondary | ICD-10-CM | POA: Diagnosis not present

## 2018-05-05 DIAGNOSIS — E785 Hyperlipidemia, unspecified: Secondary | ICD-10-CM | POA: Diagnosis not present

## 2018-05-05 DIAGNOSIS — L97213 Non-pressure chronic ulcer of right calf with necrosis of muscle: Secondary | ICD-10-CM | POA: Diagnosis not present

## 2018-05-05 DIAGNOSIS — I341 Nonrheumatic mitral (valve) prolapse: Secondary | ICD-10-CM | POA: Diagnosis not present

## 2018-05-05 DIAGNOSIS — E1140 Type 2 diabetes mellitus with diabetic neuropathy, unspecified: Secondary | ICD-10-CM | POA: Diagnosis not present

## 2018-05-05 DIAGNOSIS — I10 Essential (primary) hypertension: Secondary | ICD-10-CM | POA: Diagnosis not present

## 2018-05-05 DIAGNOSIS — E039 Hypothyroidism, unspecified: Secondary | ICD-10-CM | POA: Diagnosis not present

## 2018-05-05 DIAGNOSIS — E11622 Type 2 diabetes mellitus with other skin ulcer: Secondary | ICD-10-CM | POA: Diagnosis not present

## 2018-05-07 NOTE — Progress Notes (Signed)
Cathy, Hines ([id number]) Visit Report for 05/05/2018 HPI Details Patient Name: Cathy Hines, Cathy Hines Date of Service: 05/05/2018 2:00 PM Medical Record Number: [id number] Patient Account Number: [id number] Date of Birth/Sex: [birth year] (72 y.o. F) Treating RN: Cornell Barman Primary Care Provider: Emily Filbert Other Clinician: Referring Provider: Emily Filbert Treating Provider/Extender: Tito Dine in Treatment: 4 History of Present Illness HPI Description: 04/07/18 ADMISSION This is a 72 year old woman with type 2 diabetes and a history of diabetic neuropathy. About a month ago she fell while walking in her yard and suffered a laceration of her right anterior shin over the tibia. She tried to re-adhere the skin flaps. She went to see her primary physician on 03/11/18 was given antibiotic ointment, Telfa and Coban and was prescribed doxycycline. I believe she had an x-ray ordered that did not show up particular abnormality. On 03/15/18 Keflex was added. Most recently she has been putting Betadine on this and Vaseline and was told to soak this in Epson salts. She does not have a wound history. She has type 2 diabetes with neuropathy, hyperlipidemia hypertension hypothyroidism mitral valve prolapse and atrial fibrillation, osteoarthritis and asthma. We could not test her ABIs on either side because of complaints about pain, even the left side doesn't have a wound 04/14/18; she comes in today telling us that the debridement I did last week caused her to vomit when she got home. We are using Iodoflex to the wound as the primary dressing under Kerlix and Coban. She also had complaints about the wrap the heat etc. 04/21/18; using Iodoflex to this traumatic area on the right anterior shin. Arise with a better looking surface although still a difficult amount of tightly adherent fibrinous debris. 04/28/18; were using to flex to the traumatic area on her right anterior shin. The wound is contracted.  She does not want compression on today. Still some adherent fibrinous debris over the surface of this. 05/05/18; Hydrofera Blue to the traumatic wound on the right anterior shin. The wound is contracting. Electronic Signature(s) Signed: 05/05/2018 4:07:49 PM By: Linton Ham MD Entered By: Linton Ham on 05/05/2018 15:53:53 Cathy Hines ([id number]) -------------------------------------------------------------------------------- Physical Exam Details Patient Name: Cathy Hines Date of Service: 05/05/2018 2:00 PM Medical Record Number: [id number] Patient Account Number: [id number] Date of Birth/Sex: [birth year] (72 y.o. F) Treating RN: Cornell Barman Primary Care Provider: Emily Filbert Other Clinician: Referring Provider: Emily Filbert Treating Provider/Extender: Tito Dine in Treatment: 4 Constitutional Patient is hypertensive.. Pulse regular and within target range for patient.Marland Kitchen Respirations regular, non-labored and within target range.. Temperature is normal and within the target range for the patient.. Eyes Conjunctivae clear. No discharge. Respiratory Respiratory effort is easy and symmetric bilaterally. Rate is normal at rest and on room air.. Cardiovascular Rosales pedis. Lymphatic none palpable in the popliteal or inguinal area. Psychiatric No evidence of depression, anxiety, or agitation. Calm, cooperative, and communicative. Appropriate interactions and affect.. Notes wound exam; linear wound on the right anterior tibia. No need for debridement today. Peripheral pulses are palpable. The wound is almost separated and the 2. She does not have any Electronic Signature(s) Signed: 05/05/2018 4:07:49 PM By: Linton Ham MD Entered By: Linton Ham on 05/05/2018 15:56:44 Cathy Hines ([id number]) -------------------------------------------------------------------------------- Physician Orders Details Patient Name: Cathy Hines Date of  Service: 05/05/2018 2:00 PM Medical Record Number: [id number] Patient Account Number: [id number] Date of Birth/Sex: 04/22/1946 (72 y.o. F) Treating RN: Cornell Barman Primary Care Provider: Emily Filbert Other Clinician: Referring Provider: Sabra Heck,  Mark Treating Provider/Extender: Tito Dine in Treatment: 4 Verbal / Phone Orders: No Diagnosis Coding Wound Cleansing Wound #1 Right,Midline Lower Leg o Clean wound with Normal Saline. Anesthetic (add to Medication List) Wound #1 Right,Midline Lower Leg o Topical Lidocaine 4% cream applied to wound bed prior to debridement (In Clinic Only). Primary Wound Dressing Wound #1 Right,Midline Lower Leg o Hydrafera Blue Ready Transfer - mepitel under dressing Secondary Dressing Wound #1 Right,Midline Lower Leg o Telfa [place] Dressing Change Frequency Wound #1 Right,Midline Lower Leg o Change dressing every other day. Follow-up Appointments Wound #1 Right,Midline Lower Leg o Return Appointment in 1 week. Edema Control o Elevate legs to the level of the heart and pump ankles as often as possible Electronic Signature(s) Signed: 05/05/2018 4:07:49 PM By: Linton Ham MD Signed: 05/05/2018 4:19:21 PM By: Gretta Cool, BSN, RN, CWS, Kim RN, BSN Entered By: Gretta Cool, BSN, RN, CWS, Kim on 05/05/2018 14:04:05 AMARIYAH, BAZAR ([id number]) -------------------------------------------------------------------------------- Problem List Details Patient Name: Cathy Hines Date of Service: 05/05/2018 2:00 PM Medical Record Number: [id number] Patient Account Number: [id number] Date of Birth/Sex: 22-Mar-1946 (72 y.o. 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 04/07/2018 No Yes muscle E11.622 Type 2 diabetes mellitus with other skin ulcer 04/07/2018 No Yes E11.40 Type 2 diabetes mellitus with diabetic neuropathy, 04/07/2018 No Yes unspecified Inactive Problems Resolved Problems Electronic Signature(s) Signed: 05/05/2018 4:07:49 PM By: Linton Ham MD Entered By: Linton Ham on 05/05/2018 15:51:59 Cathy Hines ([id number]) -------------------------------------------------------------------------------- Progress Note Details Patient Name: Cathy Hines Date of Service: 05/05/2018 2:00 PM Medical Record Number: [id number] Patient Account Number: [id number] Date of Birth/Sex: 1946-08-13 (72 y.o. F) Treating RN: Cornell Barman Primary Care Provider: Emily Filbert Other Clinician: Referring Provider: Emily Filbert Treating Provider/Extender: Tito Dine in Treatment: 4 Subjective History of Present Illness (HPI) 04/07/18 ADMISSION This is a 72 year old woman with type 2 diabetes and a history of diabetic neuropathy. About a month ago she fell while walking in her yard and suffered a laceration of her right anterior shin over the tibia. She tried to re-adhere the skin flaps. She went to see her primary physician on 03/11/18 was given antibiotic ointment, Telfa and Coban and was prescribed doxycycline. I believe she had an x-ray ordered that did not show up particular abnormality. On 03/15/18 Keflex was added. Most recently she has been putting Betadine on this and Vaseline and was told to soak this in Epson salts. She does not have a wound history. She has type 2 diabetes with neuropathy, hyperlipidemia hypertension hypothyroidism mitral valve prolapse and atrial fibrillation, osteoarthritis and asthma. We could not test her ABIs on either side because of complaints about pain, even the left side doesn't have a  wound 04/14/18; she comes in today telling us that the debridement I did last week caused her to vomit when she got home. We are using Iodoflex to the wound as the primary dressing under Kerlix and Coban. She also had complaints about the wrap the heat etc. 04/21/18; using Iodoflex to this traumatic area on the right anterior shin. Arise with a better looking surface although still a difficult amount of tightly adherent fibrinous debris. 04/28/18; were using to  flex to the traumatic area on her right anterior shin. The wound is contracted. She does not want compression on today. Still some adherent fibrinous debris over the surface of this. 05/05/18; Hydrofera Blue to the traumatic wound on the right anterior shin. The wound is contracting. Objective Constitutional Patient is hypertensive.. Pulse regular and within target range for patient.Marland Kitchen Respirations regular, non-labored and within target range.. Temperature is normal and within the target range for the patient.. Vitals Time Taken: 1:39 PM, Height: 59 in, Weight: 163.4 lbs, BMI: 33, Temperature: 98.0 F, Pulse: 70 bpm, Respiratory Rate: 18 breaths/min, Blood Pressure: 148/64 mmHg. Eyes Conjunctivae clear. No discharge. Respiratory Respiratory effort is easy and symmetric bilaterally. Rate is normal at rest and on room air.Loreli Dollar, Oletta Lamas ([id number]) Cardiovascular Rosales pedis. Lymphatic none palpable in the popliteal or inguinal area. Psychiatric No evidence of depression, anxiety, or agitation. Calm, cooperative, and communicative. Appropriate interactions and affect.. General Notes: wound exam; linear wound on the right anterior tibia. No need for debridement today. Peripheral pulses are palpable. The wound is almost separated and the 2. She does not have any Integumentary (Hair, Skin) Wound #1 status is Open. Original cause of wound was Trauma. The wound is located on the Right,Midline Lower Leg. The wound measures 2.5cm length  x 0.3cm width x 0.1cm depth; 0.589cm^2 area and 0.059cm^3 volume. There is Fat Layer (Subcutaneous Tissue) Exposed exposed. There is no tunneling or undermining noted. There is a large amount of serosanguineous drainage noted. The wound margin is distinct with the outline attached to the wound base. There is large (67-100%) red, pink granulation within the wound bed. There is a small (1-33%) amount of necrotic tissue within the wound bed including Adherent Slough. The periwound skin appearance did not exhibit: Callus, Crepitus, Excoriation, Induration, Rash, Scarring, Dry/Scaly, Maceration, Atrophie Blanche, Cyanosis, Ecchymosis, Hemosiderin Staining, Mottled, Pallor, Rubor, Erythema. Periwound temperature was noted as No Abnormality. The periwound has tenderness on palpation. Assessment Active Problems ICD-10 Laceration without foreign body, right lower leg, subsequent encounter Non-pressure chronic ulcer of right calf with necrosis of muscle Type 2 diabetes mellitus with other skin ulcer Type 2 diabetes mellitus with diabetic neuropathy, unspecified Plan Wound Cleansing: Wound #1 Right,Midline Lower Leg: Clean wound with Normal Saline. Anesthetic (add to Medication List): Wound #1 Right,Midline Lower Leg: Topical Lidocaine 4% cream applied to wound bed prior to debridement (In Clinic Only). Primary Wound Dressing: Wound #1 Right,Midline Lower Leg: Hydrafera Blue Ready Transfer - mepitel under dressing Secondary Dressing: Wound #1 Right,Midline Lower Leg: Telfa [place] Dressing Change Frequency: Wound #1 Right,Midline Lower Leg: Change dressing every other day. CATHRINE, KRIZAN ([id number]) Follow-up Appointments: Wound #1 Right,Midline Lower Leg: Return Appointment in 1 week. Edema Control: Elevate legs to the level of the heart and pump ankles as often as possible #1Hydrofera Blue with Mepitel under dressing #2 [facility] #3 the patient is making progress here. No  debridement was required Electronic Signature(s) Signed: 05/05/2018 4:07:49 PM By: Linton Ham MD Entered By: Linton Ham on 05/05/2018 15:57:50 Cathy Hines ([id number]) -------------------------------------------------------------------------------- SuperBill Details Patient Name: Cathy Hines Date of Service: 05/05/2018 Medical Record Number: [id number] Patient Account Number: [id number] Date of Birth/Sex: 1946-04-08 (72 y.o. 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 of  muscle E11.622 Type 2 diabetes mellitus with other skin ulcer E11.40 Type 2 diabetes mellitus with diabetic neuropathy, unspecified Facility Procedures CPT4 Code: [id number] Description: [id number] - WOUND CARE VISIT-LEV 2 EST PT Modifier: Quantity: 1 Physician Procedures CPT4 Code: [id number] Description: [phone] - WC PHYS LEVEL 2 - EST PT ICD-10 Diagnosis Description S81.811D Laceration without foreign body, right lower leg, subseque L97.213 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

## 2018-05-08 NOTE — Progress Notes (Signed)
Cathy Hines, Cathy Hines ([id number]) Visit Report for 05/05/2018 Arrival Information Details Patient Name: Cathy Hines, Cathy Hines Date of Service: 05/05/2018 2:00 PM Medical Record Number: [id number] Patient Account Number: [id number] Date of Birth/Sex: [birth year] (72 y.o. F) Treating RN: Ahmed Prima Primary Care Jaliya Siegmann: Emily Filbert Other Clinician: Referring Archimedes Harold: Emily Filbert Treating Julius Matus/Extender: Tito Dine in Treatment: 4 Visit Information History Since Last Visit All ordered tests and consults were completed: No Patient Arrived: Ambulatory Added or deleted any medications: No Arrival Time: 13:38 Any new allergies or adverse reactions: No Accompanied By: daughter Had a fall or experienced change in No Transfer Assistance: None activities of daily living that may affect Patient Identification Verified: Yes risk of falls: Secondary Verification Process Completed: Yes Signs or symptoms of abuse/neglect since last visito No Patient Requires Transmission-Based No Hospitalized since last visit: No Precautions: Implantable device outside of the clinic excluding No Patient Has Alerts: No cellular tissue based products placed in the center since last visit: Has Dressing in Place as Prescribed: Yes Pain Present Now: No Electronic Signature(s) Signed: 05/06/2018 4:29:22 PM By: Alric Quan Entered By: Alric Quan on 05/05/2018 13:39:22 Cathy Hines ([id number]) -------------------------------------------------------------------------------- Clinic Level of Care Assessment Details Patient Name: Cathy Hines Date of Service: 05/05/2018 2:00 PM Medical Record Number: [id number] Patient Account Number: [id number] Date of Birth/Sex: [birth year] (72 y.o. F) Treating RN: Cornell Barman Primary Care Deeanna Beightol: Emily Filbert Other Clinician: Referring Macklin Jacquin: Emily Filbert Treating Legrande Hao/Extender: Tito Dine in Treatment: 4 Clinic Level of Care  Assessment Items TOOL 4 Quantity Score []  - Use when only an EandM is performed on FOLLOW-UP visit 0 ASSESSMENTS - Nursing Assessment / Reassessment []  - Reassessment of Co-morbidities (includes updates in patient status) 0 X- 1 5 Reassessment of Adherence to Treatment Plan ASSESSMENTS - Wound and Skin Assessment / Reassessment []  - Simple Wound Assessment / Reassessment - one wound 0 X- 1 5 Complex Wound Assessment / Reassessment - multiple wounds []  - 0 Dermatologic / Skin Assessment (not related to wound area) ASSESSMENTS - Focused Assessment []  - Circumferential Edema Measurements - multi extremities 0 []  - 0 Nutritional Assessment / Counseling / Intervention []  - 0 Lower Extremity Assessment (monofilament, tuning fork, pulses) []  - 0 Peripheral Arterial Disease Assessment (using hand held doppler) ASSESSMENTS - Ostomy and/or Continence Assessment and Care []  - Incontinence Assessment and Management 0 []  - 0 Ostomy Care Assessment and Management (repouching, etc.) PROCESS - Coordination of Care X - Simple Patient / Family Education for ongoing care 1 15 []  - 0 Complex (extensive) Patient / Family Education for ongoing care []  - 0 Staff obtains Programmer, systems, Records, Test Results / Process Orders []  - 0 Staff telephones HHA, Nursing Homes / Clarify orders / etc []  - 0 Routine Transfer to another Facility (non-emergent condition) []  - 0 Routine Hospital Admission (non-emergent condition) []  - 0 New Admissions / Biomedical engineer / Ordering NPWT, Apligraf, etc. []  - 0 Emergency Hospital Admission (emergent condition) X- 1 10 Simple Discharge Coordination Spark, Zyiah L. ([id number]) []  - 0 Complex (extensive) Discharge Coordination PROCESS - Special Needs []  - Pediatric / Minor Patient Management 0 []  - 0 Isolation Patient Management []  - 0 Hearing / Language / Visual special needs []  - 0 Assessment of Community assistance (transportation, D/C planning,  etc.) []  - 0 Additional assistance / Altered mentation []  - 0 Support Surface(s) Assessment (bed, cushion, seat, etc.) INTERVENTIONS - Wound Cleansing / Measurement X - Simple Wound Cleansing - one wound 1  5 []  - 0 Complex Wound Cleansing - multiple wounds X- 1 5 Wound Imaging (photographs - any number of wounds) []  - 0 Wound Tracing (instead of photographs) X- 1 5 Simple Wound Measurement - one wound []  - 0 Complex Wound Measurement - multiple wounds INTERVENTIONS - Wound Dressings []  - Small Wound Dressing one or multiple wounds 0 X- 1 15 Medium Wound Dressing one or multiple wounds []  - 0 Large Wound Dressing one or multiple wounds []  - 0 Application of Medications - topical []  - 0 Application of Medications - injection INTERVENTIONS - Miscellaneous []  - External ear exam 0 []  - 0 Specimen Collection (cultures, biopsies, blood, body fluids, etc.) []  - 0 Specimen(s) / Culture(s) sent or taken to Lab for analysis []  - 0 Patient Transfer (multiple staff / Civil Service fast streamer / Similar devices) []  - 0 Simple Staple / Suture removal (25 or less) []  - 0 Complex Staple / Suture removal (26 or more) []  - 0 Hypo / Hyperglycemic Management (close monitor of Blood Glucose) []  - 0 Ankle / Brachial Index (ABI) - do not check if billed separately X- 1 5 Vital Signs Sulser, Journe L. ([id number]) Has the patient been seen at the hospital within the last three years: Yes Total Score: 70 Level Of Care: New/Established - Level 2 Electronic Signature(s) Signed: 05/05/2018 4:19:21 PM By: Gretta Cool, BSN, RN, CWS, Kim RN, BSN Entered By: Gretta Cool, BSN, RN, CWS, Kim on 05/05/2018 14:04:43 Cathy Hines ([id number]) -------------------------------------------------------------------------------- Encounter Discharge Information Details Patient Name: Cathy Hines Date of Service: 05/05/2018 2:00 PM Medical Record Number: [id number] Patient Account Number: [id number] Date of Birth/Sex:  January 04, 1946 (72 y.o. F) Treating RN: Roger Shelter Primary Care Antoine Fiallos: Emily Filbert Other Clinician: Referring Jniya Madara: Emily Filbert Treating Chandrika Sandles/Extender: Tito Dine in Treatment: 4 Encounter Discharge Information Items Discharge Condition: Stable Ambulatory Status: Ambulatory Discharge Destination: Home Transportation: Private Auto Accompanied By: daughter Schedule Follow-up Appointment: Yes Clinical Summary of Care: Electronic Signature(s) Signed: 05/05/2018 4:10:19 PM By: Roger Shelter Entered By: Roger Shelter on 05/05/2018 14:12:25 Cathy Hines ([id number]) -------------------------------------------------------------------------------- Lower Extremity Assessment Details Patient Name: Cathy Hines Date of Service: 05/05/2018 2:00 PM Medical Record Number: [id number] Patient Account Number: [id number] Date of Birth/Sex: 06-15-1946 (72 y.o. F) Treating RN: Ahmed Prima Primary Care Darice Vicario: Emily Filbert Other Clinician: Referring Mayvis Agudelo: Emily Filbert Treating Erienne Spelman/Extender: Tito Dine in Treatment: 4 Edema Assessment Assessed: [Left: No] [Right: No] [Left: Edema] [Right: :] Calf Left: Right: Point of Measurement: 30 cm From Medial Instep cm 37.8 cm Ankle Left: Right: Point of Measurement: 8 cm From Medial Instep cm 20 cm Vascular Assessment Pulses: Posterior Tibial Extremity colors, hair growth, and conditions: Extremity Color: [Right:Normal] Temperature of Extremity: [Right:Warm] Capillary Refill: [Right:< 3 seconds] Toe Nail Assessment Left: Right: Thick: No Discolored: No Deformed: No Improper Length and Hygiene: Yes Electronic Signature(s) Signed: 05/06/2018 4:29:22 PM By: Alric Quan Entered By: Alric Quan on 05/05/2018 13:46:53 Cathy Hines ([id number]) -------------------------------------------------------------------------------- Multi Wound Chart Details Patient Name: Cathy Hines Date of Service: 05/05/2018 2:00 PM Medical Record Number: [id number] Patient Account Number: [id number] Date of Birth/Sex: 07/30/1946 (72 y.o. F) Treating RN: Cornell Barman Primary Care Verdia Bolt: Emily Filbert Other Clinician: Referring Amos Micheals: Emily Filbert Treating Cobey Raineri/Extender: Tito Dine in Treatment: 4 Vital Signs Height(in): 59 Pulse(bpm): 70 Weight(lbs): 163.4 Blood Pressure(mmHg): 148/64 Body Mass Index(BMI): 33 Temperature(F): 98.0 Respiratory Rate 18 (breaths/min): Photos: [1:No Photos] [N/A:N/A] Wound Location: [1:Right Lower Leg - Midline] [N/A:N/A] Wounding Event: [  1:Trauma] [N/A:N/A] Primary Etiology: [1:Trauma, Other] [N/A:N/A] Comorbid History: [1:Cataracts, Chronic sinus problems/congestion, Lymphedema, Asthma, Sleep Apnea, Angina, Arrhythmia, Hypertension, Type II Diabetes, Osteoarthritis, Neuropathy] [N/A:N/A] Date Acquired: [1:03/07/2018] [N/A:N/A] Weeks of Treatment: [1:4] [N/A:N/A] Wound Status: [1:Open] [N/A:N/A] Measurements L x W x D [1:2.5x0.3x0.1] [N/A:N/A] (cm) Area (cm) : [1:0.589] [N/A:N/A] Volume (cm) : [1:0.059] [N/A:N/A] % Reduction in Area: [1:92.60%] [N/A:N/A] % Reduction in Volume: [1:97.50%] [N/A:N/A] Classification: [1:Full Thickness Without Exposed Support Structures] [N/A:N/A] Exudate Amount: [1:Large] [N/A:N/A] Exudate Type: [1:Serosanguineous] [N/A:N/A] Exudate Color: [1:red, brown] [N/A:N/A] Wound Margin: [1:Distinct, outline attached] [N/A:N/A] Granulation Amount: [1:Large (67-100%)] [N/A:N/A] Granulation Quality: [1:Red, Pink] [N/A:N/A] Necrotic Amount: [1:Small (1-33%)] [N/A:N/A] Exposed Structures: [1:Fat Layer (Subcutaneous Tissue) Exposed: Yes Fascia: No Tendon: No Muscle: No Joint: No Bone: No] [N/A:N/A] Epithelialization: [1:None] [N/A:N/A] Periwound Skin Texture: Excoriation: No N/A N/A Induration: No Callus: No Crepitus: No Rash: No Scarring: No Periwound Skin Moisture: Maceration: No  N/A N/A Dry/Scaly: No Periwound Skin Color: Atrophie Blanche: No N/A N/A Cyanosis: No Ecchymosis: No Erythema: No Hemosiderin Staining: No Mottled: No Pallor: No Rubor: No Temperature: No Abnormality N/A N/A Tenderness on Palpation: Yes N/A N/A Wound Preparation: Ulcer Cleansing: N/A N/A Rinsed/Irrigated with Saline, Other: soap and water Topical Anesthetic Applied: Other: lidocaine 4% Treatment Notes Wound #1 (Right, Midline Lower Leg) 1. Cleansed with: Clean wound with Normal Saline 2. Anesthetic Topical Lidocaine 4% cream to wound bed prior to debridement 4. Dressing Applied: Hydrafera Blue Mepitel 5. Secondary Dressing Applied Bordered Foam Dressing Electronic Signature(s) Signed: 05/05/2018 4:07:49 PM By: Linton Ham MD Entered By: Linton Ham on 05/05/2018 15:52:13 Cathy Hines ([id number]) -------------------------------------------------------------------------------- [place] Details Patient Name: Cathy Hines Date of Service: 05/05/2018 2:00 PM Medical Record Number: [id number] Patient Account Number: [id number] Date of Birth/Sex: 04-Sep-1946 (72 y.o. F) Treating RN: Cornell Barman Primary Care Demitrius Crass: Emily Filbert Other Clinician: Referring Cathlin Buchan: Emily Filbert Treating Erron Wengert/Extender: Tito Dine in Treatment: 4 Active Inactive ` Abuse / Safety / Falls / Self Care Management Nursing Diagnoses: History of Falls Goals: Patient will remain injury free related to falls Date Initiated: 04/07/2018 Target Resolution Date: 05/08/2018 Goal Status: Active Interventions: Assess self care needs on admission and as needed Notes: ` Nutrition Nursing Diagnoses: Impaired glucose control: actual or potential Goals: Patient/caregiver will maintain therapeutic glucose control Date Initiated: 04/07/2018 Target Resolution Date: 05/08/2018 Goal Status: Active Interventions: Assess HgA1c results as ordered upon  admission and as needed Treatment Activities: Patient referred to Primary Care Physician for further nutritional evaluation : 04/07/2018 Notes: ` Orientation to the Wound Care Program Nursing Diagnoses: Knowledge deficit related to the wound healing center program Goals: Patient/caregiver will verbalize understanding of the [facility] Program Date Initiated: 04/07/2018 Target Resolution Date: 05/08/2018 BITANIA, SHANKLAND ([id number]) Goal Status: Active Interventions: Provide education on orientation to the wound center Notes: ` Soft Tissue Infection Nursing Diagnoses: Potential for infection: soft tissue Goals: Patient will remain free of wound infection Date Initiated: 04/07/2018 Target Resolution Date: 05/08/2018 Goal Status: Active Interventions: Assess signs and symptoms of infection every visit Notes: ` Wound/Skin Impairment Nursing Diagnoses: Impaired tissue integrity Goals: Ulcer/skin breakdown will have a volume reduction of 80% by week 12 Date Initiated: 04/07/2018 Target Resolution Date: 07/08/2018 Goal Status: Active Interventions: Assess ulceration(s) every visit Treatment Activities: Skin care regimen initiated : 04/07/2018 Topical wound management initiated : 04/07/2018 Notes: Electronic Signature(s) Signed: 05/05/2018 4:19:21 PM By: Gretta Cool, BSN, RN, CWS, Kim RN, BSN Entered By: Gretta Cool, BSN, RN, CWS, Kim on 05/05/2018 14:02:43 Gates,  Tyashia L. ([id number]) -------------------------------------------------------------------------------- Pain Assessment Details Patient Name: KAMERON, GLAZEBROOK Date of Service: 05/05/2018 2:00 PM Medical Record Number: [id number] Patient Account Number: [id number] Date of Birth/Sex: 12-23-1945 (72 y.o. F) Treating RN: Ahmed Prima Primary Care Wava Kildow: Emily Filbert Other Clinician: Referring Rosalita Carey: Emily Filbert Treating Azjah Pardo/Extender: Tito Dine in Treatment: 4 Active Problems Location of Pain  Severity and Description of Pain Patient Has Paino No Site Locations Pain Management and Medication Current Pain Management: Electronic Signature(s) Signed: 05/06/2018 4:29:22 PM By: Alric Quan Entered By: Alric Quan on 05/05/2018 13:39:27 Cathy Hines ([id number]) -------------------------------------------------------------------------------- Patient/Caregiver Education Details Patient Name: Cathy Hines Date of Service: 05/05/2018 2:00 PM Medical Record Number: [id number] Patient Account Number: [id number] Date of Birth/Gender: 07/02/1946 (72 y.o. F) Treating RN: Roger Shelter Primary Care Physician: Emily Filbert Other Clinician: Referring Physician: Emily Filbert Treating Physician/Extender: Tito Dine in Treatment: 4 Education Assessment Education Provided To: Patient Education Topics Provided Wound/Skin Impairment: Handouts: Caring for Your Ulcer Methods: Explain/Verbal Responses: State content correctly Electronic Signature(s) Signed: 05/05/2018 4:10:19 PM By: Roger Shelter Entered By: Roger Shelter on 05/05/2018 14:12:37 Cathy Hines ([id number]) -------------------------------------------------------------------------------- Wound Assessment Details Patient Name: Cathy Hines Date of Service: 05/05/2018 2:00 PM Medical Record Number: [id number] Patient Account Number: [id number] Date of Birth/Sex: [birth year] (72 y.o. F) Treating RN: Ahmed Prima Primary Care Shandon Matson: Emily Filbert Other Clinician: Referring Brittnei Jagiello: Emily Filbert Treating Lyra Alaimo/Extender: Tito Dine in Treatment: 4 Wound Status Wound Number: 1 Primary Trauma, Other Etiology: Wound Location: Right Lower Leg - Midline Wound Open Wounding Event: Trauma Status: Date Acquired: 03/07/2018 Comorbid Cataracts, Chronic sinus problems/congestion, Weeks Of Treatment: 4 History: Lymphedema, Asthma, Sleep Apnea, Angina, Clustered Wound:  No Arrhythmia, Hypertension, Type II Diabetes, Osteoarthritis, Neuropathy Photos Photo Uploaded By: Alric Quan on 05/06/2018 16:38:03 Wound Measurements Length: (cm) 2.5 Width: (cm) 0.3 Depth: (cm) 0.1 Area: (cm) 0.589 Volume: (cm) 0.059 % Reduction in Area: 92.6% % Reduction in Volume: 97.5% Epithelialization: None Tunneling: No Undermining: No Wound Description Full Thickness Without Exposed Support Classification: Structures Wound Margin: Distinct, outline attached Exudate Large Amount: Exudate Type: Serosanguineous Exudate Color: red, brown Foul Odor After Cleansing: No Slough/Fibrino Yes Wound Bed Granulation Amount: Large (67-100%) Exposed Structure Granulation Quality: Red, Pink Fascia Exposed: No Necrotic Amount: Small (1-33%) Fat Layer (Subcutaneous Tissue) Exposed: Yes Necrotic Quality: Adherent Slough Tendon Exposed: No Muscle Exposed: No Joint Exposed: No Bone Exposed: No Periwound Skin Texture Wiler, Jaymes L. ([id number]) Texture Color No Abnormalities Noted: No No Abnormalities Noted: No Callus: No Atrophie Blanche: No Crepitus: No Cyanosis: No Excoriation: No Ecchymosis: No Induration: No Erythema: No Rash: No Hemosiderin Staining: No Scarring: No Mottled: No Pallor: No Moisture Rubor: No No Abnormalities Noted: No Dry / Scaly: No Temperature / Pain Maceration: No Temperature: No Abnormality Tenderness on Palpation: Yes Wound Preparation Ulcer Cleansing: Rinsed/Irrigated with Saline, Other: soap and water, Topical Anesthetic Applied: Other: lidocaine 4%, Treatment Notes Wound #1 (Right, Midline Lower Leg) 1. Cleansed with: Clean wound with Normal Saline 2. Anesthetic Topical Lidocaine 4% cream to wound bed prior to debridement 4. Dressing Applied: Hydrafera Blue Mepitel 5. Secondary Dressing Applied Bordered Foam Dressing Electronic Signature(s) Signed: 05/06/2018 4:29:22 PM By: Alric Quan Entered By:  Alric Quan on 05/05/2018 13:45:40 Cathy Hines ([id number]) -------------------------------------------------------------------------------- [place] Details Patient Name: Cathy Hines Date of Service: 05/05/2018 2:00 PM Medical Record Number: [id number] Patient Account Number: [id number] Date of Birth/Sex: 03/15/1946 (72 y.o. F) Treating RN: Ahmed Prima Primary Care Lysa Livengood: Sabra Heck,  Mark Other Clinician: Referring Piedad Standiford: Emily Filbert Treating Waylon Koffler/Extender: Tito Dine in Treatment: 4 Vital Signs Time Taken: 13:39 Temperature (F): 98.0 Height (in): 59 Pulse (bpm): 70 Weight (lbs): 163.4 Respiratory Rate (breaths/min): 18 Body Mass Index (BMI): 33 Blood Pressure (mmHg): 148/64 Reference Range: 80 - 120 mg / dl Electronic Signature(s) Signed: 05/06/2018 4:29:22 PM By: Alric Quan Entered By: Alric Quan on 05/05/2018 13:44:31

## 2018-05-12 ENCOUNTER — Encounter: Payer: Medicare HMO | Admitting: Internal Medicine

## 2018-05-12 DIAGNOSIS — I10 Essential (primary) hypertension: Secondary | ICD-10-CM | POA: Diagnosis not present

## 2018-05-12 DIAGNOSIS — E1140 Type 2 diabetes mellitus with diabetic neuropathy, unspecified: Secondary | ICD-10-CM | POA: Diagnosis not present

## 2018-05-12 DIAGNOSIS — L97213 Non-pressure chronic ulcer of right calf with necrosis of muscle: Secondary | ICD-10-CM | POA: Diagnosis not present

## 2018-05-12 DIAGNOSIS — L97212 Non-pressure chronic ulcer of right calf with fat layer exposed: Secondary | ICD-10-CM | POA: Diagnosis not present

## 2018-05-12 DIAGNOSIS — I341 Nonrheumatic mitral (valve) prolapse: Secondary | ICD-10-CM | POA: Diagnosis not present

## 2018-05-12 DIAGNOSIS — E11622 Type 2 diabetes mellitus with other skin ulcer: Secondary | ICD-10-CM | POA: Diagnosis not present

## 2018-05-12 DIAGNOSIS — E039 Hypothyroidism, unspecified: Secondary | ICD-10-CM | POA: Diagnosis not present

## 2018-05-12 DIAGNOSIS — E1151 Type 2 diabetes mellitus with diabetic peripheral angiopathy without gangrene: Secondary | ICD-10-CM | POA: Diagnosis not present

## 2018-05-12 DIAGNOSIS — E785 Hyperlipidemia, unspecified: Secondary | ICD-10-CM | POA: Diagnosis not present

## 2018-05-12 DIAGNOSIS — I4891 Unspecified atrial fibrillation: Secondary | ICD-10-CM | POA: Diagnosis not present

## 2018-05-14 NOTE — Progress Notes (Signed)
FINLEE, CONCEPCION ([id number]) Visit Report for 05/12/2018 Arrival Information Details Patient Name: LOANY, NEUROTH Date of Service: 05/12/2018 11:15 AM Medical Record Number: [id number] Patient Account Number: [id number] Date of Birth/Sex: 08/18/1946 (72 y.o. F) Treating RN: Ahmed Prima Primary Care Contina Strain: Emily Filbert Other Clinician: Referring Malekai Markwood: Emily Filbert Treating Yuvan Medinger/Extender: Tito Dine in Treatment: 5 Visit Information History Since Last Visit All ordered tests and consults were completed: No Patient Arrived: Ambulatory Added or deleted any medications: No Arrival Time: 11:00 Any new allergies or adverse reactions: No Accompanied By: great grandaughter Had a fall or experienced change in No activities of daily living that may affect Transfer Assistance: None risk of falls: Patient Identification Verified: Yes Signs or symptoms of abuse/neglect since last visito No Secondary Verification Process Yes Hospitalized since last visit: No Completed: Implantable device outside of the clinic excluding No Patient Requires Transmission-Based No cellular tissue based products placed in the center Precautions: since last visit: Patient Has Alerts: No Has Dressing in Place as Prescribed: Yes Pain Present Now: No Electronic Signature(s) Signed: 05/12/2018 4:02:05 PM By: Alric Quan Entered By: Alric Quan on 05/12/2018 11:01:21 [name] ([id number]) -------------------------------------------------------------------------------- Clinic Level of Care Assessment Details Patient Name: [name] Date of Service: 05/12/2018 11:15 AM Medical Record Number: [id number] Patient Account Number: [id number] Date of Birth/Sex: 30-Oct-1946 (72 y.o. F) Treating RN: Cornell Barman Primary Care Dinorah Masullo: Emily Filbert Other Clinician: Referring Maritza Goldsborough: Emily Filbert Treating Zaydon Kinser/Extender: Tito Dine in Treatment: 5 Clinic  Level of Care Assessment Items TOOL 4 Quantity Score []  - Use when only an EandM is performed on FOLLOW-UP visit 0 ASSESSMENTS - Nursing Assessment / Reassessment []  - Reassessment of Co-morbidities (includes updates in patient status) 0 X- 1 5 Reassessment of Adherence to Treatment Plan ASSESSMENTS - Wound and Skin Assessment / Reassessment X - Simple Wound Assessment / Reassessment - one wound 1 5 []  - 0 Complex Wound Assessment / Reassessment - multiple wounds []  - 0 Dermatologic / Skin Assessment (not related to wound area) ASSESSMENTS - Focused Assessment []  - Circumferential Edema Measurements - multi extremities 0 []  - 0 Nutritional Assessment / Counseling / Intervention []  - 0 Lower Extremity Assessment (monofilament, tuning fork, pulses) []  - 0 Peripheral Arterial Disease Assessment (using hand held doppler) ASSESSMENTS - Ostomy and/or Continence Assessment and Care []  - Incontinence Assessment and Management 0 []  - 0 Ostomy Care Assessment and Management (repouching, etc.) PROCESS - Coordination of Care X - Simple Patient / Family Education for ongoing care 1 15 []  - 0 Complex (extensive) Patient / Family Education for ongoing care []  - 0 Staff obtains Programmer, systems, Records, Test Results / Process Orders []  - 0 Staff telephones HHA, Nursing Homes / Clarify orders / etc []  - 0 Routine Transfer to another Facility (non-emergent condition) []  - 0 Routine Hospital Admission (non-emergent condition) []  - 0 New Admissions / Biomedical engineer / Ordering NPWT, Apligraf, etc. []  - 0 Emergency Hospital Admission (emergent condition) X- 1 10 Simple Discharge Coordination Depaul, Tameaka L. ([id number]) []  - 0 Complex (extensive) Discharge Coordination PROCESS - Special Needs []  - Pediatric / Minor Patient Management 0 []  - 0 Isolation Patient Management []  - 0 Hearing / Language / Visual special needs []  - 0 Assessment of Community assistance (transportation, D/C  planning, etc.) []  - 0 Additional assistance / Altered mentation []  - 0 Support Surface(s) Assessment (bed, cushion, seat, etc.) INTERVENTIONS - Wound Cleansing / Measurement X - Simple Wound Cleansing - one  wound 1 5 []  - 0 Complex Wound Cleansing - multiple wounds X- 1 5 Wound Imaging (photographs - any number of wounds) []  - 0 Wound Tracing (instead of photographs) X- 1 5 Simple Wound Measurement - one wound []  - 0 Complex Wound Measurement - multiple wounds INTERVENTIONS - Wound Dressings []  - Small Wound Dressing one or multiple wounds 0 []  - 0 Medium Wound Dressing one or multiple wounds []  - 0 Large Wound Dressing one or multiple wounds []  - 0 Application of Medications - topical []  - 0 Application of Medications - injection INTERVENTIONS - Miscellaneous []  - External ear exam 0 []  - 0 Specimen Collection (cultures, biopsies, blood, body fluids, etc.) []  - 0 Specimen(s) / Culture(s) sent or taken to Lab for analysis []  - 0 Patient Transfer (multiple staff / Civil Service fast streamer / Similar devices) []  - 0 Simple Staple / Suture removal (25 or less) []  - 0 Complex Staple / Suture removal (26 or more) []  - 0 Hypo / Hyperglycemic Management (close monitor of Blood Glucose) []  - 0 Ankle / Brachial Index (ABI) - do not check if billed separately X- 1 5 Vital Signs Robicheaux, Takayla L. ([id number]) Has the patient been seen at the hospital within the last three years: Yes Total Score: 55 Level Of Care: New/Established - Level 2 Electronic Signature(s) Signed: 05/12/2018 5:44:56 PM By: Gretta Cool, BSN, RN, CWS, Kim RN, BSN Entered By: Gretta Cool, BSN, RN, CWS, Kim on 05/12/2018 11:34:14 [name] ([id number]) -------------------------------------------------------------------------------- Encounter Discharge Information Details Patient Name: [name] Date of Service: 05/12/2018 11:15 AM Medical Record Number: [id number] Patient Account Number: [id number] Date of  Birth/Sex: 1946-09-19 (72 y.o. F) Treating RN: Cornell Barman Primary Care Michaeljohn Biss: Emily Filbert Other Clinician: Referring Bora Broner: Emily Filbert Treating Quinley Nesler/Extender: Tito Dine in Treatment: 5 Encounter Discharge Information Items Discharge Condition: Stable Ambulatory Status: Ambulatory Discharge Destination: Home Transportation: Private Auto Accompanied By: self Schedule Follow-up Appointment: Yes Clinical Summary of Care: Electronic Signature(s) Signed: 05/12/2018 5:44:56 PM By: Gretta Cool, BSN, RN, CWS, Kim RN, BSN Entered By: Gretta Cool, BSN, RN, CWS, Kim on 05/12/2018 11:36:45 [name] ([id number]) -------------------------------------------------------------------------------- Lower Extremity Assessment Details Patient Name: [name] Date of Service: 05/12/2018 11:15 AM Medical Record Number: [id number] Patient Account Number: [id number] Date of Birth/Sex: Jul 30, 1946 (72 y.o. F) Treating RN: Ahmed Prima Primary Care Jaqulyn Chancellor: Emily Filbert Other Clinician: Referring Kosta Schnitzler: Emily Filbert Treating Breelle Hollywood/Extender: Tito Dine in Treatment: 5 Edema Assessment Assessed: [Left: No] [Right: No] [Left: Edema] [Right: :] Calf Left: Right: Point of Measurement: 30 cm From Medial Instep cm cm Ankle Left: Right: Point of Measurement: 8 cm From Medial Instep cm cm Vascular Assessment Pulses: Dorsalis Pedis Palpable: [Right:Yes] Posterior Tibial Extremity colors, hair growth, and conditions: Extremity Color: [Right:Normal] Temperature of Extremity: [Right:Warm] Capillary Refill: [Right:< 3 seconds] Toe Nail Assessment Left: Right: Thick: No Discolored: No Deformed: No Improper Length and Hygiene: Yes Electronic Signature(s) Signed: 05/12/2018 4:02:05 PM By: Alric Quan Entered By: Alric Quan on 05/12/2018 11:06:32 [name]  ([id number]) -------------------------------------------------------------------------------- Multi Wound Chart Details Patient Name: [name] Date of Service: 05/12/2018 11:15 AM Medical Record Number: [id number] Patient Account Number: [id number] Date of Birth/Sex: 05/23/1946 (72 y.o. F) Treating RN: Cornell Barman Primary Care Larrie Fraizer: Emily Filbert Other Clinician: Referring Treyvone Chelf: Emily Filbert Treating Breckan Cafiero/Extender: Tito Dine in Treatment: 5 Vital Signs Height(in): 59 Pulse(bpm): 63 Weight(lbs): 163.4 Blood Pressure(mmHg): 164/61 Body Mass Index(BMI): 33 Temperature(F): 97.8 Respiratory Rate 18 (breaths/min): Photos: [1:No  Photos] [N/A:N/A] Wound Location: [1:Right, Midline Lower Leg] [N/A:N/A] Wounding Event: [1:Trauma] [N/A:N/A] Primary Etiology: [1:Trauma, Other] [N/A:N/A] Comorbid History: [1:Cataracts, Chronic sinus problems/congestion, Lymphedema, Asthma, Sleep Apnea, Angina, Arrhythmia, Hypertension, Type II Diabetes, Osteoarthritis, Neuropathy] [N/A:N/A] Date Acquired: [1:03/07/2018] [N/A:N/A] Weeks of Treatment: [1:5] [N/A:N/A] Wound Status: [1:Healed - Epithelialized] [N/A:N/A] Measurements L x W x D [1:0x0x0] [N/A:N/A] (cm) Area (cm) : [1:0] [N/A:N/A] Volume (cm) : [1:0] [N/A:N/A] % Reduction in Area: [1:100.00%] [N/A:N/A] % Reduction in Volume: [1:100.00%] [N/A:N/A] Classification: [1:Full Thickness Without Exposed Support Structures] [N/A:N/A] Exudate Amount: [1:None Present] [N/A:N/A] Wound Margin: [1:Distinct, outline attached] [N/A:N/A] Granulation Amount: [1:Large (67-100%)] [N/A:N/A] Granulation Quality: [1:Red, Pink] [N/A:N/A] Necrotic Amount: [1:Small (1-33%)] [N/A:N/A] Exposed Structures: [1:Fat Layer (Subcutaneous Tissue) Exposed: Yes Fascia: No Tendon: No Muscle: No Joint: No Bone: No] [N/A:N/A] Epithelialization: [1:Large (67-100%)] [N/A:N/A] Periwound Skin Texture: [1:Excoriation: No Induration: No]  [N/A:N/A] Callus: No Crepitus: No Rash: No Scarring: No Periwound Skin Moisture: Maceration: No N/A N/A Dry/Scaly: No Periwound Skin Color: Atrophie Blanche: No N/A N/A Cyanosis: No Ecchymosis: No Erythema: No Hemosiderin Staining: No Mottled: No Pallor: No Rubor: No Temperature: No Abnormality N/A N/A Tenderness on Palpation: Yes N/A N/A Wound Preparation: Ulcer Cleansing: N/A N/A Rinsed/Irrigated with Saline, Other: soap and water Topical Anesthetic Applied: Other: lidocaine 4% Treatment Notes Electronic Signature(s) Signed: 05/12/2018 6:35:58 PM By: Linton Ham MD Entered By: Linton Ham on 05/12/2018 12:17:08 [name] ([id number]) -------------------------------------------------------------------------------- [place] Details Patient Name: [name] Date of Service: 05/12/2018 11:15 AM Medical Record Number: [id number] Patient Account Number: [id number] Date of Birth/Sex: [birth year] (72 y.o. F) Treating RN: Cornell Barman Primary Care Chareese Sergent: Emily Filbert Other Clinician: Referring Shayli Altemose: Emily Filbert Treating Tanikka Bresnan/Extender: Tito Dine in Treatment: 5 Active Inactive Electronic Signature(s) Signed: 05/12/2018 5:44:56 PM By: Gretta Cool, BSN, RN, CWS, Kim RN, BSN Entered By: Gretta Cool, BSN, RN, CWS, Kim on 05/12/2018 11:32:48 [name] ([id number]) -------------------------------------------------------------------------------- Pain Assessment Details Patient Name: [name] Date of Service: 05/12/2018 11:15 AM Medical Record Number: [id number] Patient Account Number: [id number] Date of Birth/Sex: Jun 21, 1946 (72 y.o. F) Treating RN: Ahmed Prima Primary Care Kaleb Sek: Emily Filbert Other Clinician: Referring Selina Tapper: Emily Filbert Treating Zhane Donlan/Extender: Tito Dine in Treatment: 5 Active Problems Location of Pain Severity and Description of Pain Patient Has Paino No Site  Locations Pain Management and Medication Current Pain Management: Electronic Signature(s) Signed: 05/12/2018 4:02:05 PM By: Alric Quan Entered By: Alric Quan on 05/12/2018 11:01:28 [name] ([id number]) -------------------------------------------------------------------------------- Patient/Caregiver Education Details Patient Name: [name] Date of Service: 05/12/2018 11:15 AM Medical Record Number: [id number] Patient Account Number: [id number] Date of Birth/Gender: [birth year] (72 y.o. F) Treating RN: Cornell Barman Primary Care Physician: Emily Filbert Other Clinician: Referring Physician: Emily Filbert Treating Physician/Extender: Tito Dine in Treatment: 5 Education Assessment Education Provided To: Patient Education Topics Provided Wound/Skin Impairment: Handouts: Other: mositurize Methods: Demonstration, Explain/Verbal Responses: State content correctly Electronic Signature(s) Signed: 05/12/2018 5:44:56 PM By: Gretta Cool, BSN, RN, CWS, Kim RN, BSN Entered By: Gretta Cool, BSN, RN, CWS, Kim on 05/12/2018 11:37:04 [name] ([id number]) -------------------------------------------------------------------------------- Wound Assessment Details Patient Name: [name] Date of Service: 05/12/2018 11:15 AM Medical Record Number: [id number] Patient Account Number: [id number] Date of Birth/Sex: December 10, 1945 (72 y.o. F) Treating RN: Cornell Barman Primary Care Garet Hooton: Emily Filbert Other Clinician: Referring Tremont Gavitt: Emily Filbert Treating Barnard Sharps/Extender: Tito Dine in Treatment: 5 Wound Status Wound Number: 1 Primary Trauma, Other Etiology: Wound Location: Right, Midline Lower Leg Wound Healed -  Epithelialized Wounding Event: Trauma Status: Date Acquired: 03/07/2018 Comorbid Cataracts, Chronic sinus problems/congestion, Weeks Of Treatment: 5 History: Lymphedema, Asthma, Sleep Apnea, Angina, Clustered Wound: No Arrhythmia,  Hypertension, Type II Diabetes, Osteoarthritis, Neuropathy Wound Measurements Length: (cm) 0 Width: (cm) 0 Depth: (cm) 0 Area: (cm) 0 Volume: (cm) 0 % Reduction in Area: 100% % Reduction in Volume: 100% Epithelialization: Large (67-100%) Tunneling: No Undermining: No Wound Description Full Thickness Without Exposed Support Foul Od Classification: Structures Slough/ Wound Margin: Distinct, outline attached Exudate None Present Amount: or After Cleansing: No Fibrino Yes Wound Bed Granulation Amount: Large (67-100%) Exposed Structure Granulation Quality: Red, Pink Fascia Exposed: No Necrotic Amount: Small (1-33%) Fat Layer (Subcutaneous Tissue) Exposed: Yes Necrotic Quality: Adherent Slough Tendon Exposed: No Muscle Exposed: No Joint Exposed: No Bone Exposed: No Periwound Skin Texture Texture Color No Abnormalities Noted: No No Abnormalities Noted: No Callus: No Atrophie Blanche: No Crepitus: No Cyanosis: No Excoriation: No Ecchymosis: No Induration: No Erythema: No Rash: No Hemosiderin Staining: No Scarring: No Mottled: No Pallor: No Moisture Rubor: No No Abnormalities Noted: No Dry / Scaly: No Temperature / Pain Maceration: No Temperature: No Abnormality Kittleson, Jannely L. ([id number]) Tenderness on Palpation: Yes Wound Preparation Ulcer Cleansing: Rinsed/Irrigated with Saline, Other: soap and water, Topical Anesthetic Applied: Other: lidocaine 4%, Electronic Signature(s) Signed: 05/12/2018 5:44:56 PM By: Gretta Cool, BSN, RN, CWS, Kim RN, BSN Entered By: Gretta Cool, BSN, RN, CWS, Kim on 05/12/2018 11:33:13 [name] ([id number]) -------------------------------------------------------------------------------- Vitals Details Patient Name: [name] Date of Service: 05/12/2018 11:15 AM Medical Record Number: [id number] Patient Account Number: [id number] Date of Birth/Sex: 08/21/1946 (72 y.o. F) Treating RN: Ahmed Prima Primary Care  Maryuri Warnke: Emily Filbert Other Clinician: Referring Jourden Gilson: Emily Filbert Treating Navika Hoopes/Extender: Tito Dine in Treatment: 5 Vital Signs Time Taken: 11:02 Temperature (F): 97.8 Height (in): 59 Pulse (bpm): 63 Weight (lbs): 163.4 Respiratory Rate (breaths/min): 18 Body Mass Index (BMI): 33 Blood Pressure (mmHg): 164/61 Reference Range: 80 - 120 mg / dl Electronic Signature(s) Signed: 05/12/2018 4:02:05 PM By: Alric Quan Entered By: Alric Quan on 05/12/2018 11:03:22

## 2018-05-14 NOTE — Progress Notes (Signed)
LAVIDA, PATCH ([id number]) Visit Report for 05/12/2018 HPI Details Patient Name: Cathy Hines, Cathy Hines Date of Service: 05/12/2018 11:15 AM Medical Record Number: [id number] Patient Account Number: [id number] Date of Birth/Sex: 13-Feb-1946 (72 y.o. F) Treating RN: Cornell Barman Primary Care Provider: Emily Filbert Other Clinician: Referring Provider: Emily Filbert Treating Provider/Extender: Tito Dine in Treatment: 5 History of Present Illness HPI Description: 04/07/18 ADMISSION This is a 72 year old woman with type 2 diabetes and a history of diabetic neuropathy. About a month ago she fell while walking in her yard and suffered a laceration of her right anterior shin over the tibia. She tried to re-adhere the skin flaps. She went to see her primary physician on 03/11/18 was given antibiotic ointment, Telfa and Coban and was prescribed doxycycline. I believe she had an x-ray ordered that did not show up particular abnormality. On 03/15/18 Keflex was added. Most recently she has been putting Betadine on this and Vaseline and was told to soak this in Epson salts. She does not have a wound history. She has type 2 diabetes with neuropathy, hyperlipidemia hypertension hypothyroidism mitral valve prolapse and atrial fibrillation, osteoarthritis and asthma. We could not test her ABIs on either side because of complaints about pain, even the left side doesn't have a wound 04/14/18; she comes in today telling us that the debridement I did last week caused her to vomit when she got home. We are using Iodoflex to the wound as the primary dressing under Kerlix and Coban. She also had complaints about the wrap the heat etc. 04/21/18; using Iodoflex to this traumatic area on the right anterior shin. Arise with a better looking surface although still a difficult amount of tightly adherent fibrinous debris. 04/28/18; were using to flex to the traumatic area on her right anterior shin. The wound is contracted.  She does not want compression on today. Still some adherent fibrinous debris over the surface of this. 05/05/18; Hydrofera Blue to the traumatic wound on the right anterior shin. The wound is contracting. 05/12/18; been using Hydrofera Blue to the traumatic wound on the right anterior shin this is totally closed today. Electronic Signature(s) Signed: 05/12/2018 6:35:58 PM By: Linton Ham MD Entered By: Linton Ham on 05/12/2018 12:21:30 Cathy Hines ([id number]) -------------------------------------------------------------------------------- Physician Orders Details Patient Name: Cathy Hines Date of Service: 05/12/2018 11:15 AM Medical Record Number: [id number] Patient Account Number: [id number] Date of Birth/Sex: 1946/10/22 (72 y.o. F) Treating RN: Cornell Barman Primary Care Provider: Emily Filbert Other Clinician: Referring Provider: Emily Filbert Treating Provider/Extender: Tito Dine in Treatment: 5 Verbal / Phone Orders: No Diagnosis Coding Discharge From [facility] Services o Discharge from [place] - treatment complete Electronic Signature(s) Signed: 05/12/2018 5:44:56 PM By: Gretta Cool, BSN, RN, CWS, Kim RN, BSN Signed: 05/12/2018 6:35:58 PM By: Linton Ham MD Entered By: Gretta Cool, BSN, RN, CWS, Kim on 05/12/2018 11:33:51 Cathy Hines, Cathy Hines ([id number]) -------------------------------------------------------------------------------- Problem List Details Patient Name: Cathy Hines Date of Service: 05/12/2018 11:15 AM Medical Record Number: [id number] Patient Account Number: [id number] Date of Birth/Sex: [birth year] (72 y.o. 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 04/07/2018 Yes Yes muscle Status Complications Interventions Other the wound is healed this was  initially Medical trauma no Decision secondary Making : prevention E11.622 Type 2 diabetes mellitus with other skin ulcer 04/07/2018 No Yes E11.40 Type 2 diabetes mellitus with diabetic neuropathy, 04/07/2018 No Yes unspecified Inactive Problems Resolved Problems Electronic Signature(s) Signed: 05/12/2018 6:35:58 PM By: Linton Ham MD Entered By: Linton Ham on 05/12/2018 12:16:50 Cathy Hines ([id number]) -------------------------------------------------------------------------------- Progress Note Details Patient Name: Cathy Hines Date of Service: 05/12/2018 11:15 AM Medical Record Number: [id number] Patient Account Number: [id number] Date of Birth/Sex: Dec 05, 1945 (72 y.o. F) Treating RN: Cornell Barman Primary Care Provider: Emily Filbert Other Clinician: Referring Provider: Emily Filbert Treating Provider/Extender: Tito Dine in Treatment: 5 Subjective History of Present Illness (HPI) 04/07/18 ADMISSION This is a 72 year old woman with type 2 diabetes and a history of diabetic neuropathy. About a month ago she fell while walking in her yard and suffered a laceration of her right anterior shin over the tibia. She tried to re-adhere the skin flaps. She went to see her primary physician on 03/11/18 was given antibiotic ointment, Telfa and Coban and was prescribed doxycycline. I believe she had an x-ray ordered that did not show up particular abnormality. On 03/15/18 Keflex was added. Most recently she has been putting Betadine on this and Vaseline and was told to soak this in Epson salts. She does not have a wound history. She has type 2 diabetes with neuropathy, hyperlipidemia hypertension hypothyroidism mitral valve prolapse and atrial fibrillation,  osteoarthritis and asthma. We could not test her ABIs on either side because of complaints about pain, even the left side doesn't have a wound 04/14/18; she comes in today telling us that the debridement I did last week caused her to vomit when she got home. We are using Iodoflex to the wound as the primary dressing under Kerlix and Coban. She also had complaints about the wrap the heat etc. 04/21/18; using Iodoflex to this traumatic area on the right anterior shin. Arise with a better looking surface although still a difficult amount of tightly adherent fibrinous debris. 04/28/18; were using to flex to the traumatic area on her right anterior shin. The wound is contracted. She does not want compression on today. Still some adherent fibrinous debris over the surface of this. 05/05/18; Hydrofera Blue to the traumatic wound on the right anterior shin. The wound is contracting. 05/12/18; been using Hydrofera Blue to the traumatic wound on the right anterior shin this is totally closed today. Objective Constitutional Vitals Time Taken: 11:02 AM, Height: 59 in, Weight: 163.4 lbs, BMI: 33, Temperature: 97.8 F, Pulse: 63 bpm, Respiratory Rate: 18 breaths/min, Blood Pressure: 164/61 mmHg. Integumentary (Hair, Skin) Wound #1 status is Healed - Epithelialized. Original cause of wound was Trauma. The wound is located on the Right,Midline Lower Leg. The wound measures 0cm length x 0cm width x 0cm depth; 0cm^2 area and 0cm^3 volume. There is Fat Layer (Subcutaneous Tissue) Exposed exposed. There is no tunneling or undermining noted. There is a none present amount of drainage noted. The wound margin is distinct with the outline attached to the wound base. There is large (67-100%) red, pink granulation within the wound bed. There is a small (1-33%) amount of necrotic tissue within the wound bed including Adherent Slough. The periwound skin appearance did not exhibit: Callus, Crepitus, Excoriation, Induration,  Rash, Scarring, Dry/Scaly, Maceration, Atrophie Blanche, Cyanosis, Ecchymosis, Hemosiderin Staining, Mottled, Pallor, Rubor, Erythema. Hines, Cathy L. ([id number]) Periwound temperature was noted as No Abnormality. The periwound has tenderness on palpation. Assessment Active Problems ICD-10 Laceration  without foreign body, right lower leg, subsequent encounter Non-pressure chronic ulcer of right calf with necrosis of muscle Type 2 diabetes mellitus with other skin ulcer Type 2 diabetes mellitus with diabetic neuropathy, unspecified Plan Discharge From [facility] Services: Discharge from [place] - treatment complete Medical Decision Making Non-pressure chronic ulcer of right calf with necrosis of muscle 04/07/2018 Status: Other Complications: the wound is healed Interventions: this was initially trauma no secondary prevention #1 the patient to be discharged from the wound care center. #2 no secondary prevention, this was initially trauma Electronic Signature(s) Signed: 05/12/2018 6:35:58 PM By: Linton Ham MD Entered By: Linton Ham on 05/12/2018 12:22:26 Cathy Hines ([id number]) -------------------------------------------------------------------------------- [place] Details Patient Name: Cathy Hines Date of Service: 05/12/2018 Medical Record Number: [id number] Patient Account Number: [id number] Date of Birth/Sex: 1946/02/07 (72 y.o. 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 E11.622 Type 2 diabetes mellitus with other skin ulcer E11.40 Type 2 diabetes mellitus with diabetic neuropathy, unspecified Facility Procedures CPT4  Code: [id number] Description: [phone] - WOUND CARE VISIT-LEV 2 EST PT Modifier: Quantity: 1 Physician Procedures CPT4 Code: [id number] Description: [phone] - WC PHYS LEVEL 2 - EST PT ICD-10 Diagnosis Description L97.213 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

## 2018-06-14 ENCOUNTER — Encounter: Payer: Self-pay | Admitting: *Deleted

## 2018-06-14 ENCOUNTER — Other Ambulatory Visit: Payer: Self-pay

## 2018-06-15 DIAGNOSIS — I2510 Atherosclerotic heart disease of native coronary artery without angina pectoris: Secondary | ICD-10-CM | POA: Diagnosis not present

## 2018-06-15 DIAGNOSIS — I10 Essential (primary) hypertension: Secondary | ICD-10-CM | POA: Diagnosis not present

## 2018-06-15 DIAGNOSIS — E119 Type 2 diabetes mellitus without complications: Secondary | ICD-10-CM | POA: Diagnosis not present

## 2018-06-15 DIAGNOSIS — Z01818 Encounter for other preprocedural examination: Secondary | ICD-10-CM | POA: Diagnosis not present

## 2018-06-15 NOTE — Anesthesia Preprocedure Evaluation (Addendum)
Anesthesia Evaluation  Patient identified by MRN, date of birth, ID band Patient awake    Reviewed: Allergy & Precautions, NPO status , Patient's Chart, lab work & pertinent test results  History of Anesthesia Complications Negative for: history of anesthetic complications  Airway Mallampati: I  TM Distance: >3 FB Neck ROM: Full    Dental  (+) Edentulous Upper, Edentulous Lower   Pulmonary asthma , sleep apnea , former smoker (quit 1983),    Pulmonary exam normal breath sounds clear to auscultation       Cardiovascular hypertension, + CAD  Normal cardiovascular exam Rhythm:Regular Rate:Normal  Cath in 2008:  Non-obstructive CAD, mild MVP  Pre-op evaluation (06/15/18) Acute; patient appears to be low risk for a low risk surgery; EKG today shows normal sinus rhythm with T wave abnormality that is nonspecific. EKG is unchanged from 2016.    Neuro/Psych PSYCHIATRIC DISORDERS Depression Dementia CVA, No Residual Symptoms    GI/Hepatic hiatal hernia, GERD  ,  Endo/Other  diabetesHypothyroidism   Renal/GU Renal InsufficiencyRenal disease (horseshoe kidney)     Musculoskeletal   Abdominal   Peds  Hematology negative hematology ROS (+)   Anesthesia Other Findings   Reproductive/Obstetrics                           Anesthesia Physical Anesthesia Plan  ASA: III  Anesthesia Plan: MAC   Post-op Pain Management:    Induction: Intravenous  PONV Risk Score and Plan: 2 and TIVA and Treatment may vary due to age or medical condition  Airway Management Planned: Natural Airway  Additional Equipment:   Intra-op Plan:   Post-operative Plan:   Informed Consent: I have reviewed the patients History and Physical, chart, labs and discussed the procedure including the risks, benefits and alternatives for the proposed anesthesia with the patient or authorized representative who has indicated his/her  understanding and acceptance.     Plan Discussed with: CRNA  Anesthesia Plan Comments:       Anesthesia Quick Evaluation

## 2018-06-16 NOTE — Discharge Instructions (Signed)
INSTRUCTIONS FOLLOWING OCULOPLASTIC SURGERY °AMY M. FOWLER, MD ° °AFTER YOUR EYE SURGERY, THER ARE MANY THINGS THWIHC YOU, THE PATIENT, CAN DO TO ASSURE THE BEST POSSIBLE RESULT FROM YOUR OPERATION.  THIS SHEET SHOULD BE REFERRED TO WHENEVER QUESTIONS ARISE.  IF THERE ARE ANY QUESTIONS NOT ANSWERED HERE, DO NOT HESITATE TO CALL OUR OFFICE AT [phone] OR [phone].  THERE IS ALWAYS [facility] AVAILABLE TO CALL IF QUESTIONS OR PROBLEMS ARISE. ° °VISION: Your vision may be blurred and out of focus after surgery until you are able to stop using your ointment, swelling resolves and your eye(s) heal. This may take 1 to 2 weeks at the least.  If your vision becomes gradually more dim or dark, this is not normal and you need to call our office immediately. ° °EYE CARE: For the first 48 hours after surgery, use ice packs frequently - “20 minutes on, 20 minutes off” - to help reduce swelling and bruising.  Small bags of frozen peas or corn make good ice packs along with cloths soaked in ice water.  If you are wearing a patch or other type of dressing following surgery, keep this on for the amount of time specified by your doctor.  For the first week following surgery, you will need to treat your stitches with great care.  If is OK to shower, but take care to not allow soapy water to run into your eye(s) to help reduce changes of infection.  You may gently clean the eyelashes and around the eye(s) with cotton balls and sterile water, BUT DO NOT RUB THE STITCHES VIGOROUSLY.  Keeping your stitches moist with ointment will help promote healing with minimal scar formation. ° °ACTIVITY: When you leave the surgery center, you should go home, rest and be inactive.  The eye(s) may feel scratchy and keeping the eyes closed will allow for faster healing.  The first week following surgery, avoid straining (anything making the face turn red) or lifting over 20 pounds.  Additionally, avoid bending which causes your head to go below  your waist.  Using your eyes will NOT harm them, so feel free to read, watch television, use the computer, etc as desired.  Driving depends on each individual, so check with your doctor if you have questions about driving. ° °MEDICATIONS:  You will be given a prescription for an ointment to use 4 times a day on your stitches.  You can use the ointment in your eyes if they feel scratchy or irritated.  If you eyelid(s) don’t close completely when you sleep, put some ointment in your eyes before bedtime. ° °EMERGENCY: If you experience SEVERE EYE PAIN OR HEADACHE UNRELIEVED BY TYLENOL OR PERCOCET, NAUSEA OR VOMITING, WORSENING REDNESS, OR WORSENING VISION (ESPECIALLY VISION THAT WA INITIALLY BETTER) CALL [phone] OR [phone] DURING BUSINESS HOURS OR AFTER HOURS. ° °General Anesthesia, Adult, Care After °These instructions provide you with information about caring for yourself after your procedure. Your health care provider may also give you more specific instructions. Your treatment has been planned according to current medical practices, but problems sometimes occur. Call your health care provider if you have any problems or questions after your procedure. °What can I expect after the procedure? °After the procedure, it is common to have: °· Vomiting. °· A sore throat. °· Mental slowness. ° °It is common to feel: °· Nauseous. °· Cold or shivery. °· Sleepy. °· Tired. °· Sore or achy, even in parts of your body where you did not have surgery. ° °  Follow these instructions at home: °For at least 24 hours after the procedure: °· Do not: °? Participate in activities where you could fall or become injured. °? Drive. °? Use heavy machinery. °? Drink alcohol. °? Take sleeping pills or medicines that cause drowsiness. °? Make important decisions or sign legal documents. °? Take care of children on your own. °· Rest. °Eating and drinking °· If you vomit, drink water, juice, or soup when you can drink without  vomiting. °· Drink enough fluid to keep your urine clear or pale yellow. °· Make sure you have little or no nausea before eating solid foods. °· Follow the diet recommended by your health care provider. °General instructions °· Have a responsible adult stay with you until you are awake and alert. °· Return to your normal activities as told by your health care provider. Ask your health care provider what activities are safe for you. °· Take over-the-counter and prescription medicines only as told by your health care provider. °· If you smoke, do not smoke without supervision. °· Keep all follow-up visits as told by your health care provider. This is important. °Contact a health care provider if: °· You continue to have nausea or vomiting at home, and medicines are not helpful. °· You cannot drink fluids or start eating again. °· You cannot urinate after 8-12 hours. °· You develop a skin rash. °· You have fever. °· You have increasing redness at the site of your procedure. °Get help right away if: °· You have difficulty breathing. °· You have chest pain. °· You have unexpected bleeding. °· You feel that you are having a life-threatening or urgent problem. °This information is not intended to replace advice given to you by your health care provider. Make sure you discuss any questions you have with your health care provider. °Document Released: 02/09/2001 Document Revised: 04/07/2016 Document Reviewed: 10/18/2015 °Elsevier Interactive Patient Education © 2018 [employer] ° °

## 2018-06-22 ENCOUNTER — Ambulatory Visit
Admission: RE | Admit: 2018-06-22 | Discharge: 2018-06-22 | Disposition: A | Discharge status: Home / Self Care | Payer: Medicare HMO | Source: Ambulatory Visit | Attending: Ophthalmology | Admitting: Ophthalmology

## 2018-06-22 ENCOUNTER — Ambulatory Visit
Admission: RE | Disposition: A | Discharge status: Home / Self Care | Payer: Self-pay | Source: Ambulatory Visit | Attending: Ophthalmology

## 2018-06-22 ENCOUNTER — Ambulatory Visit: Payer: Medicare HMO | Admitting: Anesthesiology

## 2018-06-22 DIAGNOSIS — I341 Nonrheumatic mitral (valve) prolapse: Secondary | ICD-10-CM | POA: Diagnosis not present

## 2018-06-22 DIAGNOSIS — F329 Major depressive disorder, single episode, unspecified: Secondary | ICD-10-CM | POA: Diagnosis not present

## 2018-06-22 DIAGNOSIS — Z87891 Personal history of nicotine dependence: Secondary | ICD-10-CM | POA: Diagnosis not present

## 2018-06-22 DIAGNOSIS — I2510 Atherosclerotic heart disease of native coronary artery without angina pectoris: Secondary | ICD-10-CM | POA: Diagnosis not present

## 2018-06-22 DIAGNOSIS — Z91011 Allergy to milk products: Secondary | ICD-10-CM | POA: Insufficient documentation

## 2018-06-22 DIAGNOSIS — F0390 Unspecified dementia without behavioral disturbance: Secondary | ICD-10-CM | POA: Diagnosis not present

## 2018-06-22 DIAGNOSIS — H02831 Dermatochalasis of right upper eyelid: Secondary | ICD-10-CM | POA: Diagnosis not present

## 2018-06-22 DIAGNOSIS — Z888 Allergy status to other drugs, medicaments and biological substances status: Secondary | ICD-10-CM | POA: Insufficient documentation

## 2018-06-22 DIAGNOSIS — I10 Essential (primary) hypertension: Secondary | ICD-10-CM | POA: Insufficient documentation

## 2018-06-22 DIAGNOSIS — H57813 Brow ptosis, bilateral: Secondary | ICD-10-CM | POA: Diagnosis not present

## 2018-06-22 DIAGNOSIS — E7800 Pure hypercholesterolemia, unspecified: Secondary | ICD-10-CM | POA: Insufficient documentation

## 2018-06-22 DIAGNOSIS — E039 Hypothyroidism, unspecified: Secondary | ICD-10-CM | POA: Insufficient documentation

## 2018-06-22 DIAGNOSIS — J45909 Unspecified asthma, uncomplicated: Secondary | ICD-10-CM | POA: Insufficient documentation

## 2018-06-22 DIAGNOSIS — Q631 Lobulated, fused and horseshoe kidney: Secondary | ICD-10-CM | POA: Diagnosis not present

## 2018-06-22 DIAGNOSIS — K219 Gastro-esophageal reflux disease without esophagitis: Secondary | ICD-10-CM | POA: Insufficient documentation

## 2018-06-22 DIAGNOSIS — Z8673 Personal history of transient ischemic attack (TIA), and cerebral infarction without residual deficits: Secondary | ICD-10-CM | POA: Diagnosis not present

## 2018-06-22 DIAGNOSIS — H02834 Dermatochalasis of left upper eyelid: Secondary | ICD-10-CM | POA: Insufficient documentation

## 2018-06-22 DIAGNOSIS — Z880 Allergy status to penicillin: Secondary | ICD-10-CM | POA: Diagnosis not present

## 2018-06-22 DIAGNOSIS — Z882 Allergy status to sulfonamides status: Secondary | ICD-10-CM | POA: Insufficient documentation

## 2018-06-22 DIAGNOSIS — G4730 Sleep apnea, unspecified: Secondary | ICD-10-CM | POA: Diagnosis not present

## 2018-06-22 DIAGNOSIS — L574 Cutis laxa senilis: Secondary | ICD-10-CM | POA: Insufficient documentation

## 2018-06-22 HISTORY — DX: Cerebral infarction, unspecified: I63.9

## 2018-06-22 HISTORY — DX: Unspecified osteoarthritis, unspecified site: M19.90

## 2018-06-22 HISTORY — DX: Pneumonia, unspecified organism: J18.9

## 2018-06-22 HISTORY — DX: Diverticulitis of intestine, part unspecified, without perforation or abscess without bleeding: K57.92

## 2018-06-22 HISTORY — DX: Depression, unspecified: F32.A

## 2018-06-22 HISTORY — DX: Gastro-esophageal reflux disease without esophagitis: K21.9

## 2018-06-22 HISTORY — DX: Bell's palsy: G51.0

## 2018-06-22 HISTORY — DX: Other amnesia: R41.3

## 2018-06-22 HISTORY — DX: Dizziness and giddiness: R42

## 2018-06-22 HISTORY — DX: Personal history of other diseases of the digestive system: Z87.19

## 2018-06-22 HISTORY — DX: Presence of dental prosthetic device (complete) (partial): Z97.2

## 2018-06-22 HISTORY — DX: Hypothyroidism, unspecified: E03.9

## 2018-06-22 HISTORY — DX: Unspecified dementia, unspecified severity, without behavioral disturbance, psychotic disturbance, mood disturbance, and anxiety: F03.90

## 2018-06-22 HISTORY — PX: PTOSIS REPAIR: SHX6568

## 2018-06-22 HISTORY — DX: Gastritis, unspecified, without bleeding: K29.70

## 2018-06-22 HISTORY — DX: Adverse effect of unspecified anesthetic, initial encounter: T41.45XA

## 2018-06-22 HISTORY — DX: Cough, unspecified: R05.9

## 2018-06-22 HISTORY — DX: Major depressive disorder, single episode, unspecified: F32.9

## 2018-06-22 HISTORY — DX: Pure hypercholesterolemia, unspecified: E78.00

## 2018-06-22 HISTORY — DX: Other complications of anesthesia, initial encounter: T88.59XA

## 2018-06-22 HISTORY — DX: Nonrheumatic mitral (valve) prolapse: I34.1

## 2018-06-22 HISTORY — DX: Cough: R05

## 2018-06-22 HISTORY — DX: Sleep apnea, unspecified: G47.30

## 2018-06-22 HISTORY — DX: Atherosclerotic heart disease of native coronary artery without angina pectoris: I25.10

## 2018-06-22 HISTORY — DX: Other specified diseases of liver: K76.89

## 2018-06-22 HISTORY — DX: Unspecified asthma, uncomplicated: J45.909

## 2018-06-22 HISTORY — PX: BROW LIFT: SHX178

## 2018-06-22 HISTORY — DX: Cellulitis, unspecified: L03.90

## 2018-06-22 LAB — GLUCOSE, CAPILLARY
Glucose-Capillary: 163 mg/dL — ABNORMAL HIGH (ref 70–99)
Glucose-Capillary: 143 mg/dL — ABNORMAL HIGH (ref 70–99)

## 2018-06-22 SURGERY — BLEPHAROPLASTY
Anesthesia: Monitor Anesthesia Care | Site: Eye | Laterality: Bilateral | Wound class: Clean

## 2018-06-22 MED ORDER — LACTATED RINGERS IV SOLN
10.0000 mL/h | INTRAVENOUS | Status: DC
  Administered 2018-06-22: 10 mL/h via INTRAVENOUS
  Administered 2018-06-22: 09:00:00 via INTRAVENOUS

## 2018-06-22 MED ORDER — ALFENTANIL 500 MCG/ML IJ INJ
INJECTION | INTRAVENOUS | Status: DC | PRN
  Administered 2018-06-22: 500 ug via INTRAVENOUS

## 2018-06-22 MED ORDER — MIDAZOLAM HCL 2 MG/2ML IJ SOLN
INTRAMUSCULAR | Status: DC | PRN
  Administered 2018-06-22 (×4): 0.5 mg via INTRAVENOUS

## 2018-06-22 MED ORDER — DEXMEDETOMIDINE HCL 200 MCG/2ML IV SOLN
INTRAVENOUS | Status: DC | PRN
  Administered 2018-06-22 (×5): 4 ug via INTRAVENOUS

## 2018-06-22 MED ORDER — BSS IO SOLN
INTRAOCULAR | Status: DC | PRN
  Administered 2018-06-22: 3 mL

## 2018-06-22 MED ORDER — ERYTHROMYCIN 5 MG/GM OP OINT: TOPICAL_OINTMENT | OPHTHALMIC | 2 refills | Status: DC

## 2018-06-22 MED ORDER — HYALURONIDASE HUMAN 150 UNIT/ML IJ SOLN
INTRAMUSCULAR | Status: DC | PRN
  Administered 2018-06-22: 2 mL via OPHTHALMIC
  Administered 2018-06-22: 7 mL via OPHTHALMIC

## 2018-06-22 MED ORDER — ERYTHROMYCIN 5 MG/GM OP OINT
TOPICAL_OINTMENT | OPHTHALMIC | Status: DC | PRN
  Administered 2018-06-22: 1

## 2018-06-22 MED ORDER — TETRACAINE HCL 0.5 % OP SOLN
OPHTHALMIC | Status: DC | PRN
  Administered 2018-06-22: 2 [drp] via OPHTHALMIC

## 2018-06-22 MED ORDER — ONDANSETRON HCL 4 MG/2ML IJ SOLN: 4.0000 mg | Freq: Once | INTRAMUSCULAR | Status: DC | PRN

## 2018-06-22 SURGICAL SUPPLY — 38 items
APPLICATOR COTTON TIP WD 3 STR (MISCELLANEOUS) ×8 IMPLANT
BLADE SURG 15 STRL LF DISP TIS (BLADE) ×2 IMPLANT
BLADE SURG 15 STRL SS (BLADE) ×4
CORD BIP STRL DISP 12FT (MISCELLANEOUS) ×4 IMPLANT
DRAPE HEAD BAR (DRAPES) ×4 IMPLANT
GAUZE SPONGE 4X4 12PLY STRL (GAUZE/BANDAGES/DRESSINGS) ×4 IMPLANT
GAUZE SPONGE NON-WVN 2X2 STRL (MISCELLANEOUS) ×20 IMPLANT
GLOVE SURG LX 7.0 MICRO (GLOVE) ×4
GLOVE SURG LX STRL 7.0 MICRO (GLOVE) ×4 IMPLANT
MARKER SKIN XFINE TIP W/RULER (MISCELLANEOUS) ×4 IMPLANT
NDL FILTER BLUNT 18X1 1/2 (NEEDLE) ×2 IMPLANT
NDL HYPO 30X.5 LL (NEEDLE) ×4 IMPLANT
NEEDLE FILTER BLUNT 18X 1/2SAF (NEEDLE) ×2
NEEDLE FILTER BLUNT 18X1 1/2 (NEEDLE) ×2 IMPLANT
NEEDLE HYPO 30X.5 LL (NEEDLE) ×8 IMPLANT
PACK DRAPE NASAL/ENT (PACKS) ×4 IMPLANT
SOL PREP PVP 2OZ (MISCELLANEOUS) ×4
SOLUTION PREP PVP 2OZ (MISCELLANEOUS) ×2 IMPLANT
SPONGE VERSALON 2X2 STRL (MISCELLANEOUS) ×40
SUT CHROMIC 4-0 (SUTURE)
SUT CHROMIC 4-0 M2 12X2 ARM (SUTURE)
SUT CHROMIC 5 0 P 3 (SUTURE) ×4 IMPLANT
SUT ETHILON 4 0 CL P 3 (SUTURE) IMPLANT
SUT MERSILENE 4-0 S-2 (SUTURE) IMPLANT
SUT PDS AB 4-0 P3 18 (SUTURE) IMPLANT
SUT PLAIN GUT (SUTURE) ×4 IMPLANT
SUT PROLENE 5 0 P 3 (SUTURE) ×2 IMPLANT
SUT PROLENE 6 0 P 1 18 (SUTURE) ×6 IMPLANT
SUT SILK 4 0 G 3 (SUTURE) IMPLANT
SUT VIC AB 5-0 P-3 18X BRD (SUTURE) IMPLANT
SUT VIC AB 5-0 P3 18 (SUTURE)
SUT VICRYL 6-0  S14 CTD (SUTURE)
SUT VICRYL 6-0 S14 CTD (SUTURE) IMPLANT
SUT VICRYL 7 0 TG140 8 (SUTURE) IMPLANT
SUTURE CHRMC 4-0 M2 12X2 ARM (SUTURE) IMPLANT
SYR 10ML LL (SYRINGE) ×4 IMPLANT
SYR 3ML LL SCALE MARK (SYRINGE) ×4 IMPLANT
WATER STERILE IRR 250ML POUR (IV SOLUTION) ×4 IMPLANT

## 2018-06-22 NOTE — Op Note (Signed)
Preoperative Diagnosis:   1.  Visually significant bilateral brow ptosis.  2.  Visually significant dermatochalasis bilateral upper Eyelid(s)  Postoperative Diagnosis: Same.   Procedure(s) Performed:   1.  Bilateral direct brow lift to improve vision.  2.  Upper eyelid blepharoplasty with excess skin excision bilateral upper Eyelid(s)  Teaching Surgeon: Philis Pique. Vickki Muff, M.D.   Assistants: None   Anesthesia: MAC  Specimens: None.  Estimated Blood Loss: Minimal.  Complications: None.  Operative Findings: None Dictated  PROCEDURE:   Allergies were reviewed and the patient is allergic to ace inhibitors; amoxicillin; flagyl [metronidazole]; gabapentin; milk-related compounds; namenda [memantine hcl]; statins; sulfa antibiotics; and verapamil..   After the risks, benefits, complications and alternatives were discussed with the patient, appropriate informed consent was obtained. While seated in an upright position and looking in primary gaze, the amount of supra-brow skin to be removed was measured and marked in an elliptical pattern. The patient was then brought to the operating suite and reclined supine.  Timeout was conducted and the patient was sedated. Local anesthetic consisting of a 50-50 mixture of 2% lidocaine with epinephrine and 0.75% bupivacaine with added Hylenex was injected subcutaneously to the bilateral brow region(s) and down to the periosteum and  subcutaneously to both upper eyelid(s). After adequate local was instilled, the patient was prepped and draped in the usual sterile fashion for eyelid surgery.   Attention was turned to the right brow region. A #15 blade was used to create a bevelled incision along the premarked incision line. A skin and subcutaneous tissue flap was then excised and hemostasis was obtained with bipolar cautery. The deep tissues were reapproximated with interrupted vertical 5-0 chromic sutures. The skin margin was reapproximated with a running  locking 5-0 Prolene suture. Attention was then turned to the opposite brow region where the same procedure was performed in the same manner.    Attention was then turned to the upper eyelids. A 77m upper eyelid crease incision line was marked with calipers on both upper eyelid(s).  A pinch test was used to estimate the amount of excess skin to remove and this was marked in standard blepharoplasty style fashion. Attention was turned to the right upper eyelid. A #15 blade was used to open the premarked incision line. A skin only flap was excised and hemostasis was obtained with bipolar cautery.   Attention was then turned to the opposite eyelid where the same procedure was performed in the same manner.   The skin incisions were closed with a combination of interrupted and  running 6-0 fast absorbing plain gut suture.   The patient tolerated the procedure well.  Erythromycin ophthalmic ointment was applied to the incision site(s) followed by ice packs.The patient was taken to the recovery area where she recovered without difficulty.  Post-Op Plan/Instructions:  The patient was instructed to use ice packs frequently for the next 48 hours.  she  was instructed to use erythromycin ophthalmic ointment on the eyelid incisions and over-the-counter antibiotic ointment on the brow sutures 4 times a day for the next 12 to 14 days. she was given a prescription for Percocet for pain control should Tylenol not be effective. she was asked to to follow up in 10 days time for suture removal or sooner as needed for problems.   Amy M. FVickki Muff M.D. Attending,Ophthalmology

## 2018-06-22 NOTE — Anesthesia Procedure Notes (Signed)
Procedure Name: MAC Date/Time: 06/22/2018 9:22 AM Performed by: Lind Guest, CRNA Pre-anesthesia Checklist: Patient identified, Emergency Drugs available, Suction available, Patient being monitored and Timeout performed Patient Re-evaluated:Patient Re-evaluated prior to induction Oxygen Delivery Method: Nasal cannula

## 2018-06-22 NOTE — Interval H&P Note (Signed)
History and Physical Interval Note:  06/22/2018 8:57 AM  Cathy Hines  has presented today for surgery, with the diagnosis of H02.831  H02.834 DERMATOCHALASIS L57.4  CUTIS LAXA SENILLS  The various methods of treatment have been discussed with the patient and family. After consideration of risks, benefits and other options for treatment, the patient has consented to  Procedure(s) with comments: BLEPHAROPLASTY UPPER EYELID WITH EXCESS SKIN DIABETIC (Bilateral) BROW PTOSIS REPAIR (Bilateral) - Diabetes - oral meds as a surgical intervention .  The patient's history has been reviewed, patient examined, no change in status, stable for surgery.  I have reviewed the patient's chart and labs.  Questions were answered to the patient's satisfaction.     Vickki Muff, Danea Manter M

## 2018-06-22 NOTE — H&P (Signed)
See the history and physical completed at [facility] on 06/07/18 and scanned into the chart.

## 2018-06-22 NOTE — Anesthesia Postprocedure Evaluation (Signed)
Anesthesia Post Note  Patient: Cathy Hines  Procedure(s) Performed: BLEPHAROPLASTY UPPER EYELID WITH EXCESS SKIN DIABETIC (Bilateral Eye) BROW PTOSIS REPAIR (Bilateral )  Patient location during evaluation: PACU Anesthesia Type: MAC Level of consciousness: awake and alert, oriented and patient cooperative Pain management: pain level controlled Vital Signs Assessment: post-procedure vital signs reviewed and stable Respiratory status: spontaneous breathing, nonlabored ventilation and respiratory function stable Cardiovascular status: blood pressure returned to baseline and stable Postop Assessment: adequate PO intake Anesthetic complications: no    Darrin Nipper

## 2018-06-22 NOTE — Transfer of Care (Signed)
Immediate Anesthesia Transfer of Care Note  Patient: Cathy Hines  Procedure(s) Performed: BLEPHAROPLASTY UPPER EYELID WITH EXCESS SKIN DIABETIC (Bilateral Eye) BROW PTOSIS REPAIR (Bilateral )  Patient Location: PACU  Anesthesia Type: General  Level of Consciousness: awake, alert  and patient cooperative  Airway and Oxygen Therapy: Patient Spontanous Breathing and Patient connected to supplemental oxygen  Post-op Assessment: Post-op Vital signs reviewed, Patient's Cardiovascular Status Stable, Respiratory Function Stable, Patent Airway and No signs of Nausea or vomiting  Post-op Vital Signs: Reviewed and stable  Complications: No apparent anesthesia complications

## 2018-06-23 ENCOUNTER — Encounter: Payer: Self-pay | Admitting: Ophthalmology

## 2018-07-06 DIAGNOSIS — R5381 Other malaise: Secondary | ICD-10-CM | POA: Diagnosis not present

## 2018-07-06 DIAGNOSIS — J208 Acute bronchitis due to other specified organisms: Secondary | ICD-10-CM | POA: Diagnosis not present

## 2018-07-06 DIAGNOSIS — M25561 Pain in right knee: Secondary | ICD-10-CM | POA: Diagnosis not present

## 2018-07-06 DIAGNOSIS — R5383 Other fatigue: Secondary | ICD-10-CM | POA: Diagnosis not present

## 2018-07-06 DIAGNOSIS — J40 Bronchitis, not specified as acute or chronic: Secondary | ICD-10-CM | POA: Diagnosis not present

## 2018-07-08 DIAGNOSIS — R5383 Other fatigue: Secondary | ICD-10-CM | POA: Diagnosis not present

## 2018-07-08 DIAGNOSIS — R5381 Other malaise: Secondary | ICD-10-CM | POA: Diagnosis not present

## 2018-07-09 DIAGNOSIS — S81802A Unspecified open wound, left lower leg, initial encounter: Secondary | ICD-10-CM | POA: Diagnosis not present

## 2018-07-14 ENCOUNTER — Encounter: Payer: Medicare HMO | Attending: Internal Medicine | Admitting: Internal Medicine

## 2018-07-14 DIAGNOSIS — Z87891 Personal history of nicotine dependence: Secondary | ICD-10-CM | POA: Insufficient documentation

## 2018-07-14 DIAGNOSIS — E1140 Type 2 diabetes mellitus with diabetic neuropathy, unspecified: Secondary | ICD-10-CM | POA: Diagnosis not present

## 2018-07-14 DIAGNOSIS — I10 Essential (primary) hypertension: Secondary | ICD-10-CM | POA: Insufficient documentation

## 2018-07-14 DIAGNOSIS — L97821 Non-pressure chronic ulcer of other part of left lower leg limited to breakdown of skin: Secondary | ICD-10-CM | POA: Insufficient documentation

## 2018-07-14 DIAGNOSIS — G4730 Sleep apnea, unspecified: Secondary | ICD-10-CM | POA: Insufficient documentation

## 2018-07-14 DIAGNOSIS — I4891 Unspecified atrial fibrillation: Secondary | ICD-10-CM | POA: Insufficient documentation

## 2018-07-14 DIAGNOSIS — E11622 Type 2 diabetes mellitus with other skin ulcer: Secondary | ICD-10-CM | POA: Diagnosis not present

## 2018-07-14 DIAGNOSIS — I872 Venous insufficiency (chronic) (peripheral): Secondary | ICD-10-CM | POA: Diagnosis not present

## 2018-07-14 DIAGNOSIS — S81812A Laceration without foreign body, left lower leg, initial encounter: Secondary | ICD-10-CM | POA: Diagnosis not present

## 2018-07-21 ENCOUNTER — Encounter: Payer: Medicare HMO | Attending: Internal Medicine | Admitting: Internal Medicine

## 2018-07-21 DIAGNOSIS — E1140 Type 2 diabetes mellitus with diabetic neuropathy, unspecified: Secondary | ICD-10-CM | POA: Insufficient documentation

## 2018-07-21 DIAGNOSIS — L97821 Non-pressure chronic ulcer of other part of left lower leg limited to breakdown of skin: Secondary | ICD-10-CM | POA: Insufficient documentation

## 2018-07-21 DIAGNOSIS — E11622 Type 2 diabetes mellitus with other skin ulcer: Secondary | ICD-10-CM | POA: Insufficient documentation

## 2018-07-21 DIAGNOSIS — E785 Hyperlipidemia, unspecified: Secondary | ICD-10-CM | POA: Insufficient documentation

## 2018-07-21 DIAGNOSIS — J45909 Unspecified asthma, uncomplicated: Secondary | ICD-10-CM | POA: Diagnosis not present

## 2018-07-21 DIAGNOSIS — G4730 Sleep apnea, unspecified: Secondary | ICD-10-CM | POA: Insufficient documentation

## 2018-07-21 DIAGNOSIS — I10 Essential (primary) hypertension: Secondary | ICD-10-CM | POA: Insufficient documentation

## 2018-07-21 DIAGNOSIS — E039 Hypothyroidism, unspecified: Secondary | ICD-10-CM | POA: Diagnosis not present

## 2018-07-21 DIAGNOSIS — M1990 Unspecified osteoarthritis, unspecified site: Secondary | ICD-10-CM | POA: Diagnosis not present

## 2018-07-21 DIAGNOSIS — S81812A Laceration without foreign body, left lower leg, initial encounter: Secondary | ICD-10-CM | POA: Diagnosis not present

## 2018-07-21 DIAGNOSIS — I341 Nonrheumatic mitral (valve) prolapse: Secondary | ICD-10-CM | POA: Diagnosis not present

## 2018-07-21 DIAGNOSIS — I4891 Unspecified atrial fibrillation: Secondary | ICD-10-CM | POA: Diagnosis not present

## 2018-07-25 NOTE — Progress Notes (Signed)
KIANA, HOLLAR ([id number]) Visit Report for 07/14/2018 Allergy List Details Patient Name: ALYZE, LAUF Date of Service: 07/14/2018 8:00 AM Medical Record Number: [id number] Patient Account Number: [id number] Date of Birth/Sex: 11/04/1946 (72 y.o. F) Treating RN: Montey Hora Primary Care Khali Perella: Emily Filbert Other Clinician: Referring Robby Bulkley: Emily Filbert Treating Brelan Hannen/Extender: Tito Dine in Treatment: 0 Allergies Active Allergies Sulfa (Sulfonamide Antibiotics) ACE Inhibitors Augmentin gabapentin kiwi Namenda Statins-Hmg-Coa Reductase Inhibitors verapamil lactose Allergy Notes Electronic Signature(s) Signed: 07/23/2018 3:12:23 PM By: Sharon Mt Previous Signature: 07/14/2018 5:10:17 PM Version By: Montey Hora Entered By: Sharon Mt on 07/16/2018 14:58:32 [name] ([id number]) -------------------------------------------------------------------------------- Arrival Information Details Patient Name: [name] Date of Service: 07/14/2018 8:00 AM Medical Record Number: [id number] Patient Account Number: [id number] Date of Birth/Sex: [birth year] (72 y.o. F) Treating RN: Montey Hora Primary Care Falen Lehrmann: Emily Filbert Other Clinician: Referring Katalin Colledge: Emily Filbert Treating Riley Hallum/Extender: Tito Dine in Treatment: 0 Visit Information Patient Arrived: Ambulatory Arrival Time: 08:15 Accompanied By: friend Transfer Assistance: None Patient Identification Verified: Yes Secondary Verification Process Completed: Yes Patient Has Alerts: Yes Patient Alerts: DMII History Since Last Visit Added or deleted any medications: No Any new allergies or adverse reactions: No Had a fall or experienced change in activities of daily living that may affect risk of falls: No Signs or symptoms of abuse/neglect since last visito No Hospitalized since last visit: No Implantable device outside of the clinic excluding cellular tissue  based products placed in the center since last visit: No Has Dressing in Place as Prescribed: Yes Electronic Signature(s) Signed: 07/23/2018 3:12:23 PM By: Sharon Mt Previous Signature: 07/14/2018 5:10:17 PM Version By: Montey Hora Entered By: Sharon Mt on 07/16/2018 14:57:43 [name] ([id number]) -------------------------------------------------------------------------------- Clinic Level of Care Assessment Details Patient Name: [name] Date of Service: 07/14/2018 8:00 AM Medical Record Number: [id number] Patient Account Number: [id number] Date of Birth/Sex: 1946/08/26 (72 y.o. F) Treating RN: Cornell Barman Primary Care Zianna Dercole: Emily Filbert Other Clinician: Referring Korin Setzler: Emily Filbert Treating Deontray Hunnicutt/Extender: Tito Dine in Treatment: 0 Clinic Level of Care Assessment Items TOOL 2 Quantity Score []  - Use when only an EandM is performed on the INITIAL visit 0 ASSESSMENTS - Nursing Assessment / Reassessment X - General Physical Exam (combine w/ comprehensive assessment (listed just below) when 1 20 performed on new pt. evals) X- 1 25 Comprehensive Assessment (HX, ROS, Risk Assessments, Wounds Hx, etc.) ASSESSMENTS - Wound and Skin Assessment / Reassessment X - Simple Wound Assessment / Reassessment - one wound 1 5 []  - 0 Complex Wound Assessment / Reassessment - multiple wounds []  - 0 Dermatologic / Skin Assessment (not related to wound area) ASSESSMENTS - Ostomy and/or Continence Assessment and Care []  - Incontinence Assessment and Management 0 []  - 0 Ostomy Care Assessment and Management (repouching, etc.) PROCESS - Coordination of Care X - Simple Patient / Family Education for ongoing care 1 15 []  - 0 Complex (extensive) Patient / Family Education for ongoing care []  - 0 Staff obtains Programmer, systems, Records, Test Results / Process Orders []  - 0 Staff telephones HHA, Nursing Homes / Clarify orders / etc []  - 0 Routine Transfer to another  Facility (non-emergent condition) []  - 0 Routine Hospital Admission (non-emergent condition) []  - 0 New Admissions / Biomedical engineer / Ordering NPWT, Apligraf, etc. []  - 0 Emergency Hospital Admission (emergent condition) X- 1 10 Simple Discharge Coordination []  - 0 Complex (extensive) Discharge Coordination PROCESS - Special Needs []  - Pediatric /  Minor Patient Management 0 []  - 0 Isolation Patient Management Episcopo, Tytianna L. ([id number]) []  - 0 Hearing / Language / Visual special needs []  - 0 Assessment of Community assistance (transportation, D/C planning, etc.) []  - 0 Additional assistance / Altered mentation []  - 0 Support Surface(s) Assessment (bed, cushion, seat, etc.) INTERVENTIONS - Wound Cleansing / Measurement X - Wound Imaging (photographs - any number of wounds) 1 5 []  - 0 Wound Tracing (instead of photographs) X- 1 5 Simple Wound Measurement - one wound []  - 0 Complex Wound Measurement - multiple wounds []  - 0 Simple Wound Cleansing - one wound []  - 0 Complex Wound Cleansing - multiple wounds INTERVENTIONS - Wound Dressings []  - Small Wound Dressing one or multiple wounds 0 X- 1 15 Medium Wound Dressing one or multiple wounds []  - 0 Large Wound Dressing one or multiple wounds []  - 0 Application of Medications - injection INTERVENTIONS - Miscellaneous []  - External ear exam 0 []  - 0 Specimen Collection (cultures, biopsies, blood, body fluids, etc.) []  - 0 Specimen(s) / Culture(s) sent or taken to Lab for analysis []  - 0 Patient Transfer (multiple staff / Civil Service fast streamer / Similar devices) []  - 0 Simple Staple / Suture removal (25 or less) []  - 0 Complex Staple / Suture removal (26 or more) []  - 0 Hypo / Hyperglycemic Management (close monitor of Blood Glucose) []  - 0 Ankle / Brachial Index (ABI) - do not check if billed separately Has the patient been seen at the hospital within the last three years: Yes Total Score: 100 Level Of  Care: New/Established - Level 3 Electronic Signature(s) Signed: 07/23/2018 3:12:23 PM By: Sharon Mt Previous Signature: 07/14/2018 5:28:07 PM Version By: Gretta Cool, BSN, RN, CWS, Kim RN, BSN Entered By: Sharon Mt on 07/16/2018 15:02:29 [name] ([id number]) -------------------------------------------------------------------------------- Encounter Discharge Information Details Patient Name: [name] Date of Service: 07/14/2018 8:00 AM Medical Record Number: [id number] Patient Account Number: [id number] Date of Birth/Sex: 01-17-1946 (72 y.o. F) Treating RN: Cornell Barman Primary Care Rhyder Koegel: Emily Filbert Other Clinician: Referring Batul Diego: Emily Filbert Treating Jamika Sadek/Extender: Tito Dine in Treatment: 0 Encounter Discharge Information Items Discharge Condition: Stable Ambulatory Status: Ambulatory Discharge Destination: Home Transportation: Private Auto Schedule Follow-up Appointment: Yes Clinical Summary of Care: Electronic Signature(s) Signed: 07/23/2018 3:12:23 PM By: Sharon Mt Previous Signature: 07/14/2018 5:28:07 PM Version By: Gretta Cool BSN, RN, CWS, Kim RN, BSN Entered By: Sharon Mt on 07/16/2018 15:03:53 [name] ([id number]) -------------------------------------------------------------------------------- Lower Extremity Assessment Details Patient Name: [name] Date of Service: 07/14/2018 8:00 AM Medical Record Number: [id number] Patient Account Number: [id number] Date of Birth/Sex: 1946-06-15 (72 y.o. F) Treating RN: Montey Hora Primary Care Usama Harkless: Emily Filbert Other Clinician: Referring Yailen Zemaitis: Emily Filbert Treating Ayzia Day/Extender: Tito Dine in Treatment: 0 Edema Assessment Assessed: [Left: No] [Right: No] Edema: [Left: No] [Right: No] Calf Left: Right: Point of Measurement: 32 cm From Medial Instep 38.2 cm 37.6 cm Ankle Left: Right: Point of Measurement: 10 cm From Medial Instep 20.3 cm 19.3  cm Vascular Assessment Pulses: Dorsalis Pedis Palpable: [Left:Yes] [Right:Yes] Doppler Audible: [Left:Yes] [Right:Yes] Posterior Tibial Palpable: [Left:Yes] [Right:Yes] Doppler Audible: [Left:Yes] [Right:Yes] Extremity colors, hair growth, and conditions: Extremity Color: [Left:Hyperpigmented] [Right:Hyperpigmented] Hair Growth on Extremity: [Left:Yes] [Right:Yes] Temperature of Extremity: [Left:Warm] [Right:Warm] Capillary Refill: [Left:< 3 seconds] [Right:< 3 seconds] Toe Nail Assessment Left: Right: Thick: Yes Yes Discolored: No No Deformed: No No Improper Length and Hygiene: Yes Yes Notes ABI  R >220; NO ABI L  R/T PAIN Electronic Signature(s) Signed: 07/16/2018 4:44:09 PM By: Montey Hora Signed: 07/23/2018 3:12:23 PM By: Sharon Mt Previous Signature: 07/14/2018 5:10:17 PM Version By: Montey Hora Entered By: Sharon Mt on 07/16/2018 14:58:23 [name] ([name] ([id number]) -------------------------------------------------------------------------------- Multi Wound Chart Details Patient Name: [name] Date of Service: 07/14/2018 8:00 AM Medical Record Number: [id number] Patient Account Number: [id number] Date of Birth/Sex: 24-Apr-1946 (72 y.o. F) Treating RN: Cornell Barman Primary Care Other Atienza: Emily Filbert Other Clinician: Referring Wilian Kwong: Emily Filbert Treating Cheveyo Virginia/Extender: Tito Dine in Treatment: 0 Vital Signs Height(in): 59 Pulse(bpm): 66 Weight(lbs): 160 Blood Pressure(mmHg): 152/66 Body Mass Index(BMI): 32 Temperature(F): 98.0 Respiratory Rate 16 (breaths/min): Photos: [N/A:N/A] Wound Location: Left Lower Leg - Anterior N/A N/A Wounding Event: Trauma N/A N/A Primary Etiology: Trauma, Other N/A N/A Comorbid History: N/A N/A Sorber, Kaleesi L. ([id number]) Cataracts, Chronic sinus problems/congestion, Lymphedema, Asthma, Sleep Apnea, Angina, Arrhythmia, Hypertension, Type II Diabetes,  Osteoarthritis, Neuropathy Date Acquired: 07/09/2018 N/A N/A Weeks of Treatment: 0 N/A N/A Wound Status: Open N/A N/A Measurements L x W x D 9x5x0.1 N/A N/A (cm) Area (cm) : 35.343 N/A N/A Volume (cm) : 3.534 N/A N/A % Reduction in Area: 0.00% N/A N/A % Reduction in Volume: 0.00% N/A N/A Classification: Partial Thickness N/A N/A Exudate Amount: Small N/A N/A Exudate Type: Serous N/A N/A Exudate Color: amber N/A N/A Wound Margin: Flat and Intact N/A N/A Granulation Amount: None Present (0%) N/A N/A Necrotic Amount: Small (1-33%) N/A N/A Necrotic Tissue: Eschar N/A N/A Exposed Structures: Fascia: No N/A N/A Fat Layer (Subcutaneous Tissue) Exposed: No Tendon: No Muscle: No Joint: No Bone: No Epithelialization: Medium (34-66%) N/A N/A Periwound Skin Texture: Excoriation: No N/A N/A Induration: No Callus: No Crepitus: No Rash: No Scarring: No Periwound Skin Moisture: Maceration: No N/A N/A Dry/Scaly: No Periwound Skin Color: Erythema: Yes N/A N/A Atrophie Blanche: No Cyanosis: No Ecchymosis: No Hemosiderin Staining: No Mottled: No Pallor: No Rubor: No Erythema Location: Circumferential N/A N/A Temperature: No Abnormality N/A N/A Tenderness on Palpation: No N/A N/A Wound Preparation: Ulcer Cleansing: N/A N/A Rinsed/Irrigated with Saline Topical Anesthetic Applied: Other: lidocaine 4% Treatment Notes Couchman, Lorine L. ([id number]) Wound #2 (Left, Anterior Lower Leg) 1. Cleansed with: Clean wound with Normal Saline 2. Anesthetic Topical Lidocaine 4% cream to wound bed prior to debridement 4. Dressing Applied: Prisma Ag 5. Secondary Dressing Applied ABD Pad Notes kerlix coban, unna to anchor Electronic Signature(s) Signed: 07/23/2018 3:12:23 PM By: Sharon Mt Previous Signature: 07/14/2018 5:12:47 PM Version By: Linton Ham MD Entered By: Sharon Mt on 07/16/2018 15:01:55 [name]  ([id number]) -------------------------------------------------------------------------------- Multi-Disciplinary Care Plan Details Patient Name: [name] Date of Service: 07/14/2018 8:00 AM Medical Record Number: [id number] Patient Account Number: [id number] Date of Birth/Sex: 29-Aug-1946 (72 y.o. F) Treating RN: Cornell Barman Primary Care Azim Gillingham: Emily Filbert Other Clinician: Referring Finley Dinkel: Emily Filbert Treating Stoney Karczewski/Extender: Tito Dine in Treatment: 0 Active Inactive ` Abuse / Safety / Falls / Self Care Management Nursing Diagnoses: Knowledge deficit related to: safety; personal, health (wound), emergency Goals: Patient/caregiver will verbalize understanding of skin care regimen Date Initiated: 07/14/2018 Target Resolution Date: 08/14/2018 Goal Status: Active Interventions: Podiatry chair, stretcher in low position and side rails up as needed Notes: ` Orientation to the Wound Care Program Nursing Diagnoses: Knowledge deficit related to the wound healing center program Goals: Patient/caregiver will verbalize understanding of the [place] Date Initiated: 07/14/2018 Target Resolution Date: 08/14/2018 Goal Status: Active Interventions: Provide education  on orientation to the wound center Notes: ` Pain, Acute or Chronic Nursing Diagnoses: Pain, acute or chronic: actual or potential Goals: Patient will verbalize adequate pain control and receive pain control interventions during procedures as needed Date Initiated: 07/14/2018 Target Resolution Date: 08/14/2018 Goal Status: Active Interventions: DEVINA, BEZOLD. ([id number]) Assess comfort goal upon admission Treatment Activities: Administer pain control measures as ordered : 07/14/2018 Notes: ` Wound/Skin Impairment Nursing Diagnoses: Impaired tissue integrity Goals: Ulcer/skin breakdown will have a volume reduction of 30% by week 4 Date Initiated: 07/14/2018 Target  Resolution Date: 08/14/2018 Goal Status: Active Interventions: Provide education on ulcer and skin care Treatment Activities: Referred to DME Pattrick Bady for dressing supplies : 07/14/2018 Notes: Electronic Signature(s) Signed: 07/16/2018 5:03:31 PM By: Gretta Cool, BSN, RN, CWS, Kim RN, BSN Signed: 07/23/2018 3:12:23 PM By: Sharon Mt Previous Signature: 07/14/2018 5:28:07 PM Version By: Gretta Cool, BSN, RN, CWS, Kim RN, BSN Entered By: Sharon Mt on 07/16/2018 15:01:10 HAISLEY, ARENS ([id number]) -------------------------------------------------------------------------------- Pain Assessment Details Patient Name: [name] Date of Service: 07/14/2018 8:00 AM Medical Record Number: [id number] Patient Account Number: [id number] Date of Birth/Sex: Oct 18, 1946 (72 y.o. F) Treating RN: Montey Hora Primary Care Makisha Marrin: Emily Filbert Other Clinician: Referring Denali Becvar: Emily Filbert Treating Mieko Kneebone/Extender: Tito Dine in Treatment: 0 Active Problems Location of Pain Severity and Description of Pain Patient Has Paino Yes Site Locations Pain Location: Pain in Ulcers With Dressing Change: Yes Duration of the Pain. Constant / Intermittento Constant Pain Management and Medication Current Pain Management: Electronic Signature(s) Signed: 07/16/2018 4:44:09 PM By: Montey Hora Signed: 07/23/2018 3:12:23 PM By: Sharon Mt Previous Signature: 07/14/2018 5:10:17 PM Version By: Montey Hora Entered By: Sharon Mt on 07/16/2018 14:57:54 [name] ([id number]) -------------------------------------------------------------------------------- Patient/Caregiver Education Details Patient Name: [name] Date of Service: 07/14/2018 8:00 AM Medical Record Number: [id number] Patient Account Number: [id number] Date of Birth/Gender: 1946/09/01 (72 y.o. F) Treating RN: Cornell Barman Primary Care Physician: Emily Filbert Other Clinician: Referring Physician: Emily Filbert Treating  Physician/Extender: Tito Dine in Treatment: 0 Education Assessment Education Provided To: Patient Education Topics Provided Welcome To [facility]: Handouts: [facility] Methods: Explain/Verbal Responses: State content correctly Wound Debridement: Handouts: Wound Debridement Methods: Explain/Verbal Responses: State content correctly Wound/Skin Impairment: Handouts: Caring for Your Ulcer Methods: Explain/Verbal Responses: State content correctly Electronic Signature(s) Signed: 07/23/2018 3:12:23 PM By: Sharon Mt Previous Signature: 07/14/2018 5:28:07 PM Version By: Gretta Cool, BSN, RN, CWS, Kim RN, BSN Entered By: Sharon Mt on 07/16/2018 15:04:01 [name] ([id number]) -------------------------------------------------------------------------------- Wound Assessment Details Patient Name: [name] Date of Service: 07/14/2018 8:00 AM Medical Record Number: [id number] Patient Account Number: [id number] Date of Birth/Sex: 06-Jun-1946 (72 y.o. F) Treating RN: Cornell Barman Primary Care Nyjae Hodge: Emily Filbert Other Clinician: Referring Corwyn Vora: Emily Filbert Treating Kialee Kham/Extender: Melburn Hake, HOYT Weeks in Treatment: 0 Wound Status Wound Number: 2 Primary Trauma, Other Etiology: Wound Location: Left Lower Leg - Anterior Wound Open Wounding Event: Trauma Status: Date Acquired: 07/09/2018 Comorbid Cataracts, Chronic sinus problems/congestion, Weeks Of Treatment: 0 History: Lymphedema, Asthma, Sleep Apnea, Angina, Clustered Wound: No Arrhythmia, Hypertension, Type II Diabetes, Osteoarthritis, Neuropathy Photos Wound Measurements Length: (cm) 9 % Reduction in Width: (cm) 5 % Reduction in Depth: (cm) 0.1 Epithelializat Area: (cm) 35.343 Tunneling: Volume: (cm) 3.534 Undermining: Area: 0% Volume: 0% ion: Medium (34-66%) No No Wound Description Classification: Partial Thickness Foul Odor Afte Wound Margin: Flat  and Intact Slough/Fibrino Exudate Amount: Small Exudate Type: Serous Exudate Color:  amber JUEL, RIPLEY ([id number]) r Cleansing: No No Wound Bed Granulation Amount: None Present (0%) Exposed Structure Necrotic Amount: Small (1-33%) Fascia Exposed: No Necrotic Quality: Eschar Fat Layer (Subcutaneous Tissue) Exposed: No Tendon Exposed: No Muscle Exposed: No Joint Exposed: No Bone Exposed: No Periwound Skin Texture Texture Color No Abnormalities Noted: No No Abnormalities Noted: No Callus: No Atrophie Blanche: No Crepitus: No Cyanosis: No Excoriation: No Ecchymosis: No Induration: No Erythema: Yes Rash: No Erythema Location: Circumferential Scarring: No Hemosiderin Staining: No Mottled: No Moisture Pallor: No No Abnormalities Noted: No Rubor: No Dry / Scaly: No Maceration: No Temperature / Pain Temperature: No Abnormality Wound Preparation Ulcer Cleansing: Rinsed/Irrigated with Saline Topical Anesthetic Applied: Other: lidocaine 4%, Electronic Signature(s) Signed: 07/14/2018 5:10:17 PM By: Montey Hora Signed: 07/14/2018 5:28:07 PM By: Gretta Cool, BSN, RN, CWS, Kim RN, BSN Entered By: Montey Hora on 07/14/2018 09:21:48 [name] ([id number]) -------------------------------------------------------------------------------- [place] Details Patient Name: [name] Date of Service: 07/14/2018 8:00 AM Medical Record Number: [id number] Patient Account Number: [id number] Date of Birth/Sex: 12-Oct-1946 (72 y.o. F) Treating RN: Montey Hora Primary Care Dragan Tamburrino: Emily Filbert Other Clinician: Referring Charan Prieto: Emily Filbert Treating Abeera Flannery/Extender: Tito Dine in Treatment: 0 Vital Signs Time Taken: 08:23 Temperature (F): 98.0 Height (in): 59 Pulse (bpm): 66 Source: Measured Respiratory Rate (breaths/min): 16 Weight (lbs): 160 Blood Pressure (mmHg): 152/66 Source: Measured Reference Range: 80 - 120 mg / dl Body Mass Index  (BMI): 32.3 Electronic Signature(s) Signed: 07/23/2018 3:12:23 PM By: Sharon Mt Previous Signature: 07/14/2018 5:10:17 PM Version By: Montey Hora Entered By: Sharon Mt on 07/16/2018 14:58:04

## 2018-07-25 NOTE — Progress Notes (Signed)
Cathy Hines, Cathy Hines ([id number]) Visit Report for 07/21/2018 Debridement Details Patient Name: Cathy Hines, Cathy Hines Date of Service: 07/21/2018 2:15 PM Medical Record Number: [id number] Patient Account Number: [id number] Date of Birth/Sex: August 26, 1946 (72 y.o. F) Treating RN: Cornell Barman Primary Care Provider: Emily Filbert Other Clinician: Referring Provider: Emily Filbert Treating Provider/Extender: Tito Dine in Treatment: 1 Debridement Performed for Wound #2 Left,Anterior Lower Leg Assessment: Performed By: Physician Ricard Dillon, MD Debridement Type: Debridement Pre-procedure Verification/Time Yes - 14:38 Out Taken: Start Time: 14:38 Pain Control: Other : lidocaine 4% Total Area Debrided (L x W): 2 (cm) x 5 (cm) = 10 (cm) Tissue and other material Viable, Slough, Subcutaneous, Skin: Dermis , Slough debrided: Level: Skin/Subcutaneous Tissue Debridement Description: Excisional Instrument: Curette Bleeding: Moderate Hemostasis Achieved: Pressure End Time: 14:39 Response to Treatment: Procedure was tolerated well Level of Consciousness: Awake and Alert Post Debridement Measurements of Total Wound Length: (cm) 2 Width: (cm) 5 Depth: (cm) 0.4 Volume: (cm) 3.142 Character of Wound/Ulcer Post Debridement: Requires Further Debridement Post Procedure Diagnosis Same as Pre-procedure Electronic Signature(s) Signed: 07/21/2018 4:51:46 PM By: Linton Ham MD Signed: 07/22/2018 5:22:10 PM By: Gretta Cool, BSN, RN, CWS, Kim RN, BSN Entered By: Gretta Cool, BSN, RN, CWS, Kim on 07/21/2018 14:39:49 Cathy Hines ([id number]) -------------------------------------------------------------------------------- HPI Details Patient Name: Cathy Hines Date of Service: 07/21/2018 2:15 PM Medical Record Number: [id number] Patient Account Number: [id number] Date of Birth/Sex: 05/03/1946 (72 y.o. F) Treating RN: Cornell Barman Primary Care Provider: Emily Filbert Other Clinician: Referring  Provider: Emily Filbert Treating Provider/Extender: Tito Dine in Treatment: 1 History of Present Illness HPI Description: 04/07/18 ADMISSION This is a 72 year old woman with type 2 diabetes and a history of diabetic neuropathy. About a month ago she fell while walking in her yard and suffered a laceration of her right anterior shin over the tibia. She tried to re-adhere the skin flaps. She went to see her primary physician on 03/11/18 was given antibiotic ointment, Telfa and Coban and was prescribed doxycycline. I believe she had an x-ray ordered that did not show up particular abnormality. On 03/15/18 Keflex was added. Most recently she has been putting Betadine on this and Vaseline and was told to soak this in Epson salts. She does not have a wound history. She has type 2 diabetes with neuropathy, hyperlipidemia hypertension hypothyroidism mitral valve prolapse and atrial fibrillation, osteoarthritis and asthma. We could not test her ABIs on either side because of complaints about pain, even the left side doesn't have a wound 04/14/18; she comes in today telling us that the debridement I did last week caused her to vomit when she got home. We are using Iodoflex to the wound as the primary dressing under Kerlix and Coban. She also had complaints about the wrap the heat etc. 04/21/18; using Iodoflex to this traumatic area on the right anterior shin. Arise with a better looking surface although still a difficult amount of tightly adherent fibrinous debris. 04/28/18; were using to flex to the traumatic area on her right anterior shin. The wound is contracted. She does not want compression on today. Still some adherent fibrinous debris over the surface of this. 05/05/18; Hydrofera Blue to the traumatic wound on the right anterior shin. The wound is contracting. 05/12/18; been using Hydrofera Blue to the traumatic wound on the right anterior shin this is totally closed  today. READMISSION 07/14/18 Patient is here today with a one-week history of traumatic wounds on the left anterior shin area. This occurred while  she was lifting sheet metal out of the trunk of her car with a part of it falling on her left anterior ankle. She has 4 open areas all and close juxtaposition. The top 3 are minor. She has a laceration/skin tear inferiorly in the wound area. This is horizontal some surface debris. She is complaining of a lot of pain although none in these wounds look like they should be that painful. She was seen in urgent care after the incident was not given any antibiotics. She was not x-rayed. She has been applying Silvadene that she got from a friend and feels that helped with the pain. she is previously here with a traumatic wound on her right anterior tibial area just 2 months ago. The left leg was too painful to do ABIs on today although it was noncompressible when she was here the last time. Today the right leg is noncompressible 07/21/18; the patient took her left leg wrap off 2 or 3 days ago. Since then she's been using Neosporin. This was only a Kerlix Coban wrap and should not have been causing her that much trouble. She arrives today with nonviable surface eschar requiring debridement Electronic Signature(s) Signed: 07/21/2018 4:51:46 PM By: Linton Ham MD Entered By: Linton Ham on 07/21/2018 15:24:03 Cathy Hines ([id number]) Cathy Hines, Cathy Hines ([id number]) -------------------------------------------------------------------------------- Physical Exam Details Patient Name: Cathy Hines Date of Service: 07/21/2018 2:15 PM Medical Record Number: [id number] Patient Account Number: [id number] Date of Birth/Sex: 12/19/1945 (72 y.o. F) Treating RN: Cornell Barman Primary Care Provider: Emily Filbert Other Clinician: Referring Provider: Emily Filbert Treating Provider/Extender: Tito Dine in Treatment: 1 Constitutional Sitting or  standing Blood Pressure is within target range for patient.. Pulse regular and within target range for patient.Marland Kitchen Respirations regular, non-labored and within target range.. Temperature is normal and within the target range for the patient.Marland Kitchen appears in no distress. Cardiovascular pedal pulses are palpable.. mild edema bilaterally. Notes wound exam; left anterior tibia distally. The largest wound area covered and tightly adherent surface debris. Using a #5 curet this was debrided off. Underneath the tissue looks fairly healthy. Hemostasis with direct pressure. No evidence of surrounding infection Electronic Signature(s) Signed: 07/21/2018 4:51:46 PM By: Linton Ham MD Entered By: Linton Ham on 07/21/2018 15:29:39 Cathy Hines ([id number]) -------------------------------------------------------------------------------- Physician Orders Details Patient Name: Cathy Hines Date of Service: 07/21/2018 2:15 PM Medical Record Number: [id number] Patient Account Number: [id number] Date of Birth/Sex: [birth year] (72 y.o. F) Treating RN: Cornell Barman Primary Care Provider: Emily Filbert Other Clinician: Referring Provider: Emily Filbert Treating Provider/Extender: Tito Dine in Treatment: 1 Verbal / Phone Orders: No Diagnosis Coding Wound Cleansing Wound #2 Left,Anterior Lower Leg o Clean wound with Normal Saline. Anesthetic (add to Medication List) Wound #2 Left,Anterior Lower Leg o Topical Lidocaine 4% cream applied to wound bed prior to debridement (In Clinic Only). Primary Wound Dressing Wound #2 Left,Anterior Lower Leg o Hydrafera Blue Ready Transfer - Mepitel One under Secondary Dressing Wound #2 Left,Anterior Lower Leg o Other - telfa island Dressing Change Frequency Wound #2 Left,Anterior Lower Leg o Change Dressing Monday, Wednesday, Friday Follow-up Appointments Wound #2 Left,Anterior Lower Leg o Return Appointment in 1 week. Electronic  Signature(s) Signed: 07/21/2018 4:51:46 PM By: Linton Ham MD Signed: 07/22/2018 5:22:10 PM By: Gretta Cool, BSN, RN, CWS, Kim RN, BSN Entered By: Gretta Cool, BSN, RN, CWS, Kim on 07/21/2018 14:42:03 Cathy Hines ([id number]) -------------------------------------------------------------------------------- Problem List Details Patient Name: Cathy Hines Date of Service: 07/21/2018 2:15 PM Medical  Record Number: [id number] Patient Account Number: [id number] Date of Birth/Sex: [birth year] (72 y.o. F) Treating RN: Cornell Barman Primary Care Provider: Emily Filbert Other Clinician: Referring Provider: Emily Filbert Treating Provider/Extender: Tito Dine in Treatment: 1 Active Problems ICD-10 Evaluated Encounter Code Description Active Date Today Diagnosis S81.812D Laceration without foreign body, left lower leg, subsequent 07/14/2018 No Yes encounter E11.622 Type 2 diabetes mellitus with other skin ulcer 07/14/2018 No Yes L97.821 Non-pressure chronic ulcer of other part of left lower leg 07/14/2018 No Yes limited to breakdown of skin Inactive Problems Resolved Problems Electronic Signature(s) Signed: 07/21/2018 4:51:46 PM By: Linton Ham MD Entered By: Linton Ham on 07/21/2018 15:14:15 Cathy Hines ([id number]) -------------------------------------------------------------------------------- Progress Note Details Patient Name: Cathy Hines Date of Service: 07/21/2018 2:15 PM Medical Record Number: [id number] Patient Account Number: [id number] Date of Birth/Sex: [birth year] (72 y.o. F) Treating RN: Cornell Barman Primary Care Provider: Emily Filbert Other Clinician: Referring Provider: Emily Filbert Treating Provider/Extender: Tito Dine in Treatment: 1 Subjective History of Present Illness (HPI) 04/07/18 ADMISSION This is a 72 year old woman with type 2 diabetes and a history of diabetic neuropathy. About a month ago she fell while walking in her yard and  suffered a laceration of her right anterior shin over the tibia. She tried to re-adhere the skin flaps. She went to see her primary physician on 03/11/18 was given antibiotic ointment, Telfa and Coban and was prescribed doxycycline. I believe she had an x-ray ordered that did not show up particular abnormality. On 03/15/18 Keflex was added. Most recently she has been putting Betadine on this and Vaseline and was told to soak this in Epson salts. She does not have a wound history. She has type 2 diabetes with neuropathy, hyperlipidemia hypertension hypothyroidism mitral valve prolapse and atrial fibrillation, osteoarthritis and asthma. We could not test her ABIs on either side because of complaints about pain, even the left side doesn't have a wound 04/14/18; she comes in today telling us that the debridement I did last week caused her to vomit when she got home. We are using Iodoflex to the wound as the primary dressing under Kerlix and Coban. She also had complaints about the wrap the heat etc. 04/21/18; using Iodoflex to this traumatic area on the right anterior shin. Arise with a better looking surface although still a difficult amount of tightly adherent fibrinous debris. 04/28/18; were using to flex to the traumatic area on her right anterior shin. The wound is contracted. She does not want compression on today. Still some adherent fibrinous debris over the surface of this. 05/05/18; Hydrofera Blue to the traumatic wound on the right anterior shin. The wound is contracting. 05/12/18; been using Hydrofera Blue to the traumatic wound on the right anterior shin this is totally closed today. READMISSION 07/14/18 Patient is here today with a one-week history of traumatic wounds on the left anterior shin area. This occurred while she was lifting sheet metal out of the trunk of her car with a part of it falling on her left anterior ankle. She has 4 open areas all and close juxtaposition. The top 3 are  minor. She has a laceration/skin tear inferiorly in the wound area. This is horizontal some surface debris. She is complaining of a lot of pain although none in these wounds look like they should be that painful. She was seen in urgent care after the incident was not given any antibiotics. She was not x-rayed. She has been applying Silvadene that she got  from a friend and feels that helped with the pain. she is previously here with a traumatic wound on her right anterior tibial area just 2 months ago. The left leg was too painful to do ABIs on today although it was noncompressible when she was here the last time. Today the right leg is noncompressible 07/21/18; the patient took her left leg wrap off 2 or 3 days ago. Since then she's been using Neosporin. This was only a Kerlix Coban wrap and should not have been causing her that much trouble. She arrives today with nonviable surface eschar requiring debridement Cathy Hines, Cathy L. ([id number]) Objective Constitutional Sitting or standing Blood Pressure is within target range for patient.. Pulse regular and within target range for patient.Marland Kitchen Respirations regular, non-labored and within target range.. Temperature is normal and within the target range for the patient.Marland Kitchen appears in no distress. Vitals Time Taken: 2:20 PM, Height: 59 in, Weight: 160 lbs, BMI: 32.3, Temperature: 98.2 F, Pulse: 74 bpm, Respiratory Rate: 16 breaths/min, Blood Pressure: 148/51 mmHg. Cardiovascular pedal pulses are palpable.. mild edema bilaterally. General Notes: wound exam; left anterior tibia distally. The largest wound area covered and tightly adherent surface debris. Using a #5 curet this was debrided off. Underneath the tissue looks fairly healthy. Hemostasis with direct pressure. No evidence of surrounding infection Integumentary (Hair, Skin) Wound #2 status is Open. Original cause of wound was Trauma. The wound is located on the Left,Anterior Lower Leg. The wound  measures 2cm length x 5cm width x 0.2cm depth; 7.854cm^2 area and 1.571cm^3 volume. There is no tunneling or undermining noted. There is a small amount of serous drainage noted. The wound margin is flat and intact. There is small (1-33%) red, pink granulation within the wound bed. There is a medium (34-66%) amount of necrotic tissue within the wound bed including Eschar. The periwound skin appearance exhibited: Erythema. The periwound skin appearance did not exhibit: Callus, Crepitus, Excoriation, Induration, Rash, Scarring, Dry/Scaly, Maceration, Atrophie Blanche, Cyanosis, Ecchymosis, Hemosiderin Staining, Mottled, Pallor, Rubor. The surrounding wound skin color is noted with erythema which is circumferential. Periwound temperature was noted as No Abnormality. Assessment Active Problems ICD-10 Laceration without foreign body, left lower leg, subsequent encounter Type 2 diabetes mellitus with other skin ulcer Non-pressure chronic ulcer of other part of left lower leg limited to breakdown of skin Procedures Wound #2 Pre-procedure diagnosis of Wound #2 is a Trauma, Other located on the Left,Anterior Lower Leg . There was a Excisional Skin/Subcutaneous Tissue Debridement with a total area of 10 sq cm performed by Ricard Dillon, MD. With the following instrument(s): Curette to remove Viable tissue/material. Material removed includes Subcutaneous Tissue, Slough, and Skin: Dermis after achieving pain control using Other (lidocaine 4%). No specimens were taken. A time out was conducted at 14:38, prior to the start of the procedure. A Moderate amount of bleeding was controlled with Pressure. The procedure was tolerated well. Patient s Level of Consciousness post procedure was recorded as Awake and Alert. Post Shifrin, Analisia L. ([id number]) Debridement Measurements: 2cm length x 5cm width x 0.4cm depth; 3.142cm^3 volume. Character of Wound/Ulcer Post Debridement requires further debridement. Post  procedure Diagnosis Wound #2: Same as Pre-Procedure Plan Wound Cleansing: Wound #2 Left,Anterior Lower Leg: Clean wound with Normal Saline. Anesthetic (add to Medication List): Wound #2 Left,Anterior Lower Leg: Topical Lidocaine 4% cream applied to wound bed prior to debridement (In Clinic Only). Primary Wound Dressing: Wound #2 Left,Anterior Lower Leg: Hydrafera Blue Ready Transfer - Mepitel One under Secondary Dressing: Wound #  2 Left,Anterior Lower Leg: Other - telfa island Dressing Change Frequency: Wound #2 Left,Anterior Lower Leg: Change Dressing Monday, Wednesday, Friday Follow-up Appointments: Wound #2 Left,Anterior Lower Leg: Return Appointment in 1 week. #1 continue with Hydrofera Blue however that I am taking her out of compression that she doesn't [place] in any case. We'll put Mepitel under the Hydrofera Blue to afford sticking in border foam. Told her to change this every second day and not to get it wet on days that she is not changing in #2I was largely putting her in compression to avoid her touching this wound however she seems intent on taking her compression off. Electronic Signature(s) Signed: 07/21/2018 4:51:46 PM By: Linton Ham MD Entered By: Linton Ham on 07/21/2018 15:30:45 Cathy Hines ([id number]) -------------------------------------------------------------------------------- [place] Details Patient Name: Cathy Hines Date of Service: 07/21/2018 Medical Record Number: [id number] Patient Account Number: [id number] Date of Birth/Sex: 1946/09/15 (72 y.o. F) Treating RN: Cornell Barman Primary Care Provider: Emily Filbert Other Clinician: Referring Provider: Emily Filbert Treating Provider/Extender: Tito Dine in Treatment: 1 Diagnosis Coding ICD-10 Codes Code Description [phone] Laceration without foreign body, left lower leg, subsequent encounter E11.622 Type 2 diabetes mellitus with other skin ulcer L97.821 Non-pressure  chronic ulcer of other part of left lower leg limited to breakdown of skin Facility Procedures CPT4 Code Description: [id number] 11042 - DEB SUBQ TISSUE 20 SQ CM/< ICD-10 Diagnosis Description L97.821 Non-pressure chronic ulcer of other part of left lower leg limit Modifier: ed to breakdown Quantity: 1 of skin Physician Procedures CPT4 Code Description: [id number] 11042 - WC PHYS SUBQ TISS 20 SQ CM ICD-10 Diagnosis Description L97.821 Non-pressure chronic ulcer of other part of left lower leg limit Modifier: ed to breakdown Quantity: 1 of skin Electronic Signature(s) Signed: 07/21/2018 4:51:46 PM By: Linton Ham MD Entered By: Linton Ham on 07/21/2018 15:31:03

## 2018-07-25 NOTE — Progress Notes (Signed)
CRESENCIA, ASMUS ([id number]) Visit Report for 07/21/2018 Arrival Information Details Patient Name: Cathy, Hines Date of Service: 07/21/2018 2:15 PM Medical Record Number: [id number] Patient Account Number: [id number] Date of Birth/Sex: 1946-02-21 (72 y.o. F) Treating RN: Secundino Ginger Primary Care Brandilyn Nanninga: Emily Filbert Other Clinician: Referring Lyndsee Casa: Emily Filbert Treating Joevon Holliman/Extender: Tito Dine in Treatment: 1 Visit Information History Since Last Visit Added or deleted any medications: No Patient Arrived: Ambulatory Any new allergies or adverse reactions: No Arrival Time: 14:19 Had a fall or experienced change in No Accompanied By: family activities of daily living that may affect Transfer Assistance: None risk of falls: Patient Identification Verified: Yes Signs or symptoms of abuse/neglect since last visito No Secondary Verification Process Completed: Yes Hospitalized since last visit: No Patient Has Alerts: Yes Implantable device outside of the clinic excluding No Patient Alerts: DMII cellular tissue based products placed in the center since last visit: Pain Present Now: No Electronic Signature(s) Signed: 07/21/2018 2:39:50 PM By: Secundino Ginger Entered By: Secundino Ginger on 07/21/2018 14:20:40 Cathy Hines ([id number]) -------------------------------------------------------------------------------- Encounter Discharge Information Details Patient Name: Cathy Hines Date of Service: 07/21/2018 2:15 PM Medical Record Number: [id number] Patient Account Number: [id number] Date of Birth/Sex: 08-15-1946 (72 y.o. F) Treating RN: Montey Hora Primary Care Roye Gustafson: Emily Filbert Other Clinician: Referring Declyn Delsol: Emily Filbert Treating Creta Dorame/Extender: Tito Dine in Treatment: 1 Encounter Discharge Information Items Discharge Condition: Stable Ambulatory Status: Ambulatory Discharge Destination: Home Transportation: Private  Auto Accompanied By: self Schedule Follow-up Appointment: Yes Clinical Summary of Care: Electronic Signature(s) Signed: 07/21/2018 3:19:29 PM By: Montey Hora Entered By: Montey Hora on 07/21/2018 15:19:29 Cathy Hines ([id number]) -------------------------------------------------------------------------------- Lower Extremity Assessment Details Patient Name: Cathy Hines Date of Service: 07/21/2018 2:15 PM Medical Record Number: [id number] Patient Account Number: [id number] Date of Birth/Sex: [birth year] (72 y.o. F) Treating RN: Secundino Ginger Primary Care Nam Vossler: Emily Filbert Other Clinician: Referring Madalaine Portier: Emily Filbert Treating Homar Weinkauf/Extender: Tito Dine in Treatment: 1 Edema Assessment Assessed: [Left: No] [Right: No] Edema: [Left: N] [Right: o] Calf Left: Right: Point of Measurement: 32 cm From Medial Instep 37 cm cm Ankle Left: Right: Point of Measurement: 10 cm From Medial Instep 21 cm cm Vascular Assessment Claudication: Claudication Assessment [Left:None] Pulses: Dorsalis Pedis Palpable: [Left:Yes] Posterior Tibial Extremity colors, hair growth, and conditions: Extremity Color: [Left:Normal] Hair Growth on Extremity: [Left:No] Temperature of Extremity: [Left:Cool] Capillary Refill: [Left:< 3 seconds] Toe Nail Assessment Left: Right: Thick: Yes Discolored: No Deformed: No Improper Length and Hygiene: No Electronic Signature(s) Signed: 07/21/2018 2:39:50 PM By: Secundino Ginger Entered By: Secundino Ginger on 07/21/2018 14:28:56 Cathy Hines ([id number]) -------------------------------------------------------------------------------- Multi Wound Chart Details Patient Name: Cathy Hines Date of Service: 07/21/2018 2:15 PM Medical Record Number: [id number] Patient Account Number: [id number] Date of Birth/Sex: 06-19-1946 (72 y.o. F) Treating RN: Cornell Barman Primary Care Lauretta Sallas: Emily Filbert Other Clinician: Referring Shenica Holzheimer: Emily Filbert Treating Shepherd Finnan/Extender: Tito Dine in Treatment: 1 Vital Signs Height(in): 59 Pulse(bpm): 85 Weight(lbs): 160 Blood Pressure(mmHg): 148/51 Body Mass Index(BMI): 32 Temperature(F): 98.2 Respiratory Rate 16 (breaths/min): Photos: [N/A:N/A] Wound Location: Left Lower Leg - Anterior N/A N/A Wounding Event: Trauma N/A N/A Primary Etiology: Trauma, Other N/A N/A Comorbid History: Cataracts, Chronic sinus N/A N/A problems/congestion, Lymphedema, Asthma, Sleep Apnea, Angina, Arrhythmia, Hypertension, Type II Diabetes, Osteoarthritis, Neuropathy Date Acquired: 07/09/2018 N/A N/A Weeks of Treatment: 1 N/A N/A Wound Status: Open N/A N/A Measurements L x W x D 2x5x0.2 N/A N/A (cm) Area (cm) :  7.854 N/A N/A Volume (cm) : 1.571 N/A N/A % Reduction in Area: 77.80% N/A N/A % Reduction in Volume: 55.50% N/A N/A Classification: Partial Thickness N/A N/A Exudate Amount: Small N/A N/A Exudate Type: Serous N/A N/A Exudate Color: amber N/A N/A Wound Margin: Flat and Intact N/A N/A Granulation Amount: Small (1-33%) N/A N/A Granulation Quality: Red, Pink N/A N/A Necrotic Amount: Medium (34-66%) N/A N/A Necrotic Tissue: Eschar N/A N/A Exposed Structures: N/A N/A Cathy, [place]. ([id number]) Fascia: No Fat Layer (Subcutaneous Tissue) Exposed: No Tendon: No Muscle: No Joint: No Bone: No Epithelialization: Medium (34-66%) N/A N/A Debridement: Debridement - Excisional N/A N/A Pre-procedure 14:38 N/A N/A Verification/Time Out Taken: Pain Control: Other N/A N/A Tissue Debrided: Subcutaneous, Slough N/A N/A Level: Skin/Subcutaneous Tissue N/A N/A Debridement Area (sq cm): 10 N/A N/A Instrument: Curette N/A N/A Bleeding: Moderate N/A N/A Hemostasis Achieved: Pressure N/A N/A Debridement Treatment Procedure was tolerated well N/A N/A Response: Post Debridement 2x5x0.4 N/A N/A Measurements L x W x D (cm) Post Debridement Volume: 3.142 N/A  N/A (cm) Periwound Skin Texture: Excoriation: No N/A N/A Induration: No Callus: No Crepitus: No Rash: No Scarring: No Periwound Skin Moisture: Maceration: No N/A N/A Dry/Scaly: No Periwound Skin Color: Erythema: Yes N/A N/A Atrophie Blanche: No Cyanosis: No Ecchymosis: No Hemosiderin Staining: No Mottled: No Pallor: No Rubor: No Erythema Location: Circumferential N/A N/A Temperature: No Abnormality N/A N/A Tenderness on Palpation: No N/A N/A Wound Preparation: Ulcer Cleansing: N/A N/A Rinsed/Irrigated with Saline Topical Anesthetic Applied: Other: lidocaine 4% Procedures Performed: Debridement N/A N/A Treatment Notes Electronic Signature(s) Signed: 07/21/2018 4:51:46 PM By: Linton Ham MD Entered By: Linton Ham on 07/21/2018 15:14:29 Cathy Hines ([id number]) Cathy, Hines ([id number]) -------------------------------------------------------------------------------- [place] Details Patient Name: Cathy Hines Date of Service: 07/21/2018 2:15 PM Medical Record Number: [id number] Patient Account Number: [id number] Date of Birth/Sex: Jul 25, 1946 (72 y.o. F) Treating RN: Cornell Barman Primary Care Ameir Faria: Emily Filbert Other Clinician: Referring Jerrianne Hartin: Emily Filbert Treating Khila Papp/Extender: Tito Dine in Treatment: 1 Active Inactive ` Abuse / Safety / Falls / Self Care Management Nursing Diagnoses: Knowledge deficit related to: safety; personal, health (wound), emergency Goals: Patient/caregiver will verbalize understanding of skin care regimen Date Initiated: 07/14/2018 Target Resolution Date: 08/14/2018 Goal Status: Active Interventions: Podiatry chair, stretcher in low position and side rails up as needed Notes: ` Orientation to the Wound Care Program Nursing Diagnoses: Knowledge deficit related to the wound healing center program Goals: Patient/caregiver will verbalize understanding of the [place] Date Initiated: 07/14/2018 Target Resolution Date: 08/14/2018 Goal Status: Active Interventions: Provide education on orientation to the wound center Notes: ` Pain, Acute or Chronic Nursing Diagnoses: Pain, acute or chronic: actual or potential Goals: Patient will verbalize adequate pain control and receive pain control interventions during procedures as needed Date Initiated: 07/14/2018 Target Resolution Date: 08/14/2018 Goal Status: Active Interventions: JULISSA, BROWNING. ([id number]) Assess comfort goal upon admission Treatment Activities: Administer pain control measures as ordered : 07/14/2018 Notes: ` Wound/Skin Impairment Nursing Diagnoses: Impaired tissue integrity Goals: Ulcer/skin breakdown will have a volume reduction of 30% by week 4 Date Initiated: 07/14/2018 Target Resolution Date: 08/14/2018 Goal Status: Active Interventions: Provide education on ulcer and skin care Treatment Activities: Referred to DME Gideon Burstein for dressing supplies : 07/14/2018 Notes: Electronic Signature(s) Signed: 07/22/2018 5:22:10 PM By: Gretta Cool, BSN, RN, CWS, Kim RN, BSN Entered By: Gretta Cool, BSN, RN, CWS, Kim on 07/21/2018 14:37:09 Cathy Hines ([id number]) -------------------------------------------------------------------------------- Pain Assessment Details Patient Name: Cathy Hines,  Cathy L. Date of Service: 07/21/2018 2:15 PM Medical Record Number: [id number] Patient Account Number: [id number] Date of Birth/Sex: [birth year] (72 y.o. F) Treating RN: Secundino Ginger Primary Care Anand Tejada: Emily Filbert Other Clinician: Referring Marya Lowden: Emily Filbert Treating Tarl Cephas/Extender: Tito Dine in Treatment: 1 Active Problems Location of Pain Severity and Description of Pain Patient Has Paino No Site Locations Pain Management and Medication Current Pain Management: Goals for Pain Management pt denies any pain at this time. Electronic Signature(s) Signed: 07/21/2018  2:39:50 PM By: Secundino Ginger Entered By: Secundino Ginger on 07/21/2018 14:21:00 Cathy Hines ([id number]) -------------------------------------------------------------------------------- Patient/Caregiver Education Details Patient Name: Cathy Hines Date of Service: 07/21/2018 2:15 PM Medical Record Number: [id number] Patient Account Number: [id number] Date of Birth/Gender: 01/21/1946 (72 y.o. F) Treating RN: Montey Hora Primary Care Physician: Emily Filbert Other Clinician: Referring Physician: Emily Filbert Treating Physician/Extender: Tito Dine in Treatment: 1 Education Assessment Education Provided To: Patient Education Topics Provided Wound/Skin Impairment: Handouts: Other: wound care as ordered Methods: Demonstration, Explain/Verbal Responses: State content correctly Electronic Signature(s) Signed: 07/21/2018 4:57:10 PM By: Montey Hora Entered By: Montey Hora on 07/21/2018 15:19:44 Cathy Hines ([id number]) -------------------------------------------------------------------------------- Wound Assessment Details Patient Name: Cathy Hines Date of Service: 07/21/2018 2:15 PM Medical Record Number: [id number] Patient Account Number: [id number] Date of Birth/Sex: 11-10-1946 (72 y.o. F) Treating RN: Secundino Ginger Primary Care Nerissa Constantin: Emily Filbert Other Clinician: Referring Trayveon Beckford: Emily Filbert Treating Rhylie Stehr/Extender: Tito Dine in Treatment: 1 Wound Status Wound Number: 2 Primary Trauma, Other Etiology: Wound Location: Left Lower Leg - Anterior Wound Open Wounding Event: Trauma Status: Date Acquired: 07/09/2018 Comorbid Cataracts, Chronic sinus problems/congestion, Weeks Of Treatment: 1 History: Lymphedema, Asthma, Sleep Apnea, Angina, Clustered Wound: No Arrhythmia, Hypertension, Type II Diabetes, Osteoarthritis, Neuropathy Photos Photo Uploaded By: Secundino Ginger on 07/21/2018 14:35:19 Wound Measurements Length: (cm) 2 Width:  (cm) 5 Depth: (cm) 0.2 Area: (cm) 7.854 Volume: (cm) 1.571 % Reduction in Area: 77.8% % Reduction in Volume: 55.5% Epithelialization: Medium (34-66%) Tunneling: No Undermining: No Wound Description Classification: Partial Thickness Foul Odor Wound Margin: Flat and Intact Slough/Fi Exudate Amount: Small Exudate Type: Serous Exudate Color: amber After Cleansing: No brino No Wound Bed Granulation Amount: Small (1-33%) Exposed Structure Granulation Quality: Red, Pink Fascia Exposed: No Necrotic Amount: Medium (34-66%) Fat Layer (Subcutaneous Tissue) Exposed: No Necrotic Quality: Eschar Tendon Exposed: No Muscle Exposed: No Joint Exposed: No Bone Exposed: No Cathy Hines, Cathy L. ([id number]) Periwound Skin Texture Texture Color No Abnormalities Noted: No No Abnormalities Noted: No Callus: No Atrophie Blanche: No Crepitus: No Cyanosis: No Excoriation: No Ecchymosis: No Induration: No Erythema: Yes Rash: No Erythema Location: Circumferential Scarring: No Hemosiderin Staining: No Mottled: No Moisture Pallor: No No Abnormalities Noted: No Rubor: No Dry / Scaly: No Maceration: No Temperature / Pain Temperature: No Abnormality Wound Preparation Ulcer Cleansing: Rinsed/Irrigated with Saline Topical Anesthetic Applied: Other: lidocaine 4%, Treatment Notes Wound #2 (Left, Anterior Lower Leg) 1. Cleansed with: Clean wound with Normal Saline 2. Anesthetic Topical Lidocaine 4% cream to wound bed prior to debridement 4. Dressing Applied: Hydrafera Blue Mepitel 5. Secondary Whitaker Signature(s) Signed: 07/21/2018 2:39:50 PM By: Secundino Ginger Entered By: Secundino Ginger on 07/21/2018 14:26:33 Cathy Hines ([id number]) -------------------------------------------------------------------------------- [place] Details Patient Name: Cathy Hines Date of Service: 07/21/2018 2:15 PM Medical Record Number: [id number] Patient Account Number:  [id number] Date of Birth/Sex: 02/26/1946 (72 y.o. F) Treating RN: Secundino Ginger Primary Care Stewart Sasaki: Emily Filbert Other Clinician: Referring Rayetta Veith: Emily Filbert  Treating Dreamer Carillo/Extender: Ricard Dillon Weeks in Treatment: 1 Vital Signs Time Taken: 14:20 Temperature (F): 98.2 Height (in): 59 Pulse (bpm): 74 Weight (lbs): 160 Respiratory Rate (breaths/min): 16 Body Mass Index (BMI): 32.3 Blood Pressure (mmHg): 148/51 Reference Range: 80 - 120 mg / dl Electronic Signature(s) Signed: 07/21/2018 2:39:50 PM By: Secundino Ginger Entered BySecundino Ginger on 07/21/2018 09:64:38

## 2018-07-26 DIAGNOSIS — S81802A Unspecified open wound, left lower leg, initial encounter: Secondary | ICD-10-CM | POA: Diagnosis not present

## 2018-07-26 NOTE — Progress Notes (Signed)
TAIWAN, MILLON ([id number]) Visit Report for 07/14/2018 Abuse/Suicide Risk Screen Details Patient Name: Cathy Hines, Cathy Hines Date of Service: 07/14/2018 8:00 AM Medical Record Number: [id number] Patient Account Number: [id number] Date of Birth/Sex: 09-07-1946 (72 y.o. F) Treating RN: Montey Hora Primary Care Ralphine Hinks: Emily Filbert Other Clinician: Referring Roberth Berling: Emily Filbert Treating Olly Shiner/Extender: Tito Dine in Treatment: 0 Abuse/Suicide Risk Screen Items Answer ABUSE/SUICIDE RISK SCREEN: Has anyone close to you tried to hurt or harm you recentlyo No Do you feel uncomfortable with anyone in your familyo No Has anyone forced you do things that you didnot want to doo No Do you have any thoughts of harming yourselfo No Patient displays signs or symptoms of abuse and/or neglect. No Electronic Signature(s) Signed: 07/16/2018 4:44:09 PM By: Montey Hora Signed: 07/23/2018 3:12:23 PM By: Sharon Mt Previous Signature: 07/14/2018 5:10:17 PM Version By: Montey Hora Entered By: Sharon Mt on 07/16/2018 14:58:57 Cathy Hines ([id number]) -------------------------------------------------------------------------------- Activities of Daily Living Details Patient Name: Cathy Hines Date of Service: 07/14/2018 8:00 AM Medical Record Number: [id number] Patient Account Number: [id number] Date of Birth/Sex: 30-Dec-1945 (72 y.o. F) Treating RN: Montey Hora Primary Care Sarabeth Benton: Emily Filbert Other Clinician: Referring Sherica Paternostro: Emily Filbert Treating Jarick Harkins/Extender: Tito Dine in Treatment: 0 Activities of Daily Living Items Answer Activities of Daily Living (Please select one for each item) Drive Automobile Completely Able Take Medications Completely Able Use Telephone Completely Able Care for Appearance Completely Able Use Toilet Completely Able Bath / Shower Completely Able Dress Self Completely Able Feed Self Completely Able Walk Completely  Able Get In / Out Bed Completely Able Housework Completely Able Prepare Meals Completely Able Handle Money Completely Able Shop for Self Completely Able Electronic Signature(s) Signed: 07/16/2018 4:44:09 PM By: Montey Hora Signed: 07/23/2018 3:12:23 PM By: Sharon Mt Previous Signature: 07/14/2018 5:10:17 PM Version By: Montey Hora Entered By: Sharon Mt on 07/16/2018 14:59:06 Cathy Hines ([id number]) -------------------------------------------------------------------------------- Education Assessment Details Patient Name: Cathy Hines Date of Service: 07/14/2018 8:00 AM Medical Record Number: [id number] Patient Account Number: [id number] Date of Birth/Sex: 03-10-1946 (72 y.o. F) Treating RN: Montey Hora Primary Care Kalan Yeley: Emily Filbert Other Clinician: Referring Karinda Cabriales: Emily Filbert Treating Allard Lightsey/Extender: Tito Dine in Treatment: 0 Primary Learner Assessed: Patient Learning Preferences/Education Level/Primary Language Learning Preference: Explanation, Demonstration Highest Education Level: High School Preferred Language: English Cognitive Barrier Assessment/Beliefs Language Barrier: No Translator Needed: No Memory Deficit: No Emotional Barrier: No Cultural/Religious Beliefs Affecting Medical Care: No Physical Barrier Assessment Impaired Vision: No Impaired Hearing: No Decreased Hand dexterity: No Knowledge/Comprehension Assessment Knowledge Level: Medium Comprehension Level: Medium Ability to understand written Medium instructions: Ability to understand verbal Medium instructions: Motivation Assessment Anxiety Level: Calm Cooperation: Cooperative Education Importance: Acknowledges Need Interest in Health Problems: Asks Questions Perception: Coherent Willingness to Engage in Self- Medium Management Activities: Readiness to Engage in Self- Medium Management Activities: Electronic Signature(s) Signed: 07/16/2018 4:44:09 PM  By: Montey Hora Signed: 07/23/2018 3:12:23 PM By: Sharon Mt Previous Signature: 07/14/2018 5:10:17 PM Version By: Montey Hora Entered By: Sharon Mt on 07/16/2018 14:59:14 Cathy Hines ([id number]) -------------------------------------------------------------------------------- Fall Risk Assessment Details Patient Name: Cathy Hines Date of Service: 07/14/2018 8:00 AM Medical Record Number: [id number] Patient Account Number: [id number] Date of Birth/Sex: 07/22/1946 (72 y.o. F) Treating RN: Montey Hora Primary Care Tyshika Baldridge: Emily Filbert Other Clinician: Referring Vedh Ptacek: Emily Filbert Treating Jamez Ambrocio/Extender: Tito Dine in Treatment: 0 Fall Risk Assessment Items Have you had 2 or more falls in the last 12  monthso 0 Yes Have you had any fall that resulted in injury in the last 12 monthso 0 No FALL RISK ASSESSMENT: History of falling - immediate or within 3 months 25 Yes Secondary diagnosis 0 No Ambulatory aid None/bed rest/wheelchair/nurse 0 Yes Crutches/cane/walker 0 No Furniture 0 No IV Access/Saline Lock 0 No Gait/Training Normal/bed rest/immobile 0 No Weak 10 Yes Impaired 0 No Mental Status Oriented to own ability 0 Yes Electronic Signature(s) Signed: 07/16/2018 4:44:09 PM By: Montey Hora Signed: 07/23/2018 3:12:23 PM By: Sharon Mt Previous Signature: 07/14/2018 5:10:17 PM Version By: Montey Hora Entered By: Sharon Mt on 07/16/2018 14:59:22 Cathy Hines ([id number]) -------------------------------------------------------------------------------- Foot Assessment Details Patient Name: Cathy Hines Date of Service: 07/14/2018 8:00 AM Medical Record Number: [id number] Patient Account Number: [id number] Date of Birth/Sex: 12-09-1945 (72 y.o. F) Treating RN: Montey Hora Primary Care Willia Genrich: Emily Filbert Other Clinician: Referring Malakhai Beitler: Emily Filbert Treating Gema Ringold/Extender: Tito Dine in Treatment: 0 Foot  Assessment Items Site Locations + = Sensation present, - = Sensation absent, C = Callus, U = Ulcer R = Redness, W = Warmth, M = Maceration, PU = Pre-ulcerative lesion F = Fissure, S = Swelling, D = Dryness Assessment Right: Left: Other Deformity: No No Prior Foot Ulcer: No No Prior Amputation: No No Charcot Joint: No No Ambulatory Status: Ambulatory Without Help Gait: Unsteady Electronic Signature(s) Signed: 07/16/2018 4:44:09 PM By: Montey Hora Signed: 07/23/2018 3:12:23 PM By: Sharon Mt Previous Signature: 07/14/2018 5:10:17 PM Version By: Montey Hora Entered By: Sharon Mt on 07/16/2018 14:59:53 Cathy Hines ([id number]) -------------------------------------------------------------------------------- Nutrition Risk Assessment Details Patient Name: Cathy Hines Date of Service: 07/14/2018 8:00 AM Medical Record Number: [id number] Patient Account Number: [id number] Date of Birth/Sex: 01/16/1946 (72 y.o. F) Treating RN: Montey Hora Primary Care Kron Everton: Emily Filbert Other Clinician: Referring Jereld Presti: Emily Filbert Treating Jerrica Thorman/Extender: Tito Dine in Treatment: 0 Height (in): 59 Weight (lbs): 160 Body Mass Index (BMI): 32.3 Nutrition Risk Assessment Items NUTRITION RISK SCREEN: I have an illness or condition that made me change the kind and/or amount of 0 No food I eat I eat fewer than two meals per day 0 No I eat few fruits and vegetables, or milk products 0 No I have three or more drinks of beer, liquor or wine almost every day 0 No I have tooth or mouth problems that make it hard for me to eat 0 No I don't always have enough money to buy the food I need 0 No I eat alone most of the time 0 No I take three or more different prescribed or over-the-counter drugs a day 1 Yes Without wanting to, I have lost or gained 10 pounds in the last six months 0 No I am not always physically able to shop, cook and/or feed myself 0 No Nutrition  Protocols Good Risk Protocol 0 No interventions needed Moderate Risk Protocol Electronic Signature(s) Signed: 07/16/2018 4:44:09 PM By: Montey Hora Signed: 07/23/2018 3:12:23 PM By: Sharon Mt Previous Signature: 07/14/2018 5:10:17 PM Version By: Montey Hora Entered By: Sharon Mt on 07/16/2018 14:59:31

## 2018-08-04 ENCOUNTER — Encounter: Payer: Medicare HMO | Admitting: Internal Medicine

## 2018-08-04 DIAGNOSIS — L97821 Non-pressure chronic ulcer of other part of left lower leg limited to breakdown of skin: Secondary | ICD-10-CM | POA: Diagnosis not present

## 2018-08-04 DIAGNOSIS — E785 Hyperlipidemia, unspecified: Secondary | ICD-10-CM | POA: Diagnosis not present

## 2018-08-04 DIAGNOSIS — E039 Hypothyroidism, unspecified: Secondary | ICD-10-CM | POA: Diagnosis not present

## 2018-08-04 DIAGNOSIS — S81812A Laceration without foreign body, left lower leg, initial encounter: Secondary | ICD-10-CM | POA: Diagnosis not present

## 2018-08-04 DIAGNOSIS — M1990 Unspecified osteoarthritis, unspecified site: Secondary | ICD-10-CM | POA: Diagnosis not present

## 2018-08-04 DIAGNOSIS — E11622 Type 2 diabetes mellitus with other skin ulcer: Secondary | ICD-10-CM | POA: Diagnosis not present

## 2018-08-04 DIAGNOSIS — I10 Essential (primary) hypertension: Secondary | ICD-10-CM | POA: Diagnosis not present

## 2018-08-04 DIAGNOSIS — E1140 Type 2 diabetes mellitus with diabetic neuropathy, unspecified: Secondary | ICD-10-CM | POA: Diagnosis not present

## 2018-08-04 DIAGNOSIS — I4891 Unspecified atrial fibrillation: Secondary | ICD-10-CM | POA: Diagnosis not present

## 2018-08-04 DIAGNOSIS — I341 Nonrheumatic mitral (valve) prolapse: Secondary | ICD-10-CM | POA: Diagnosis not present

## 2018-08-05 DIAGNOSIS — E119 Type 2 diabetes mellitus without complications: Secondary | ICD-10-CM | POA: Diagnosis not present

## 2018-08-05 DIAGNOSIS — R5382 Chronic fatigue, unspecified: Secondary | ICD-10-CM | POA: Diagnosis not present

## 2018-08-05 DIAGNOSIS — E039 Hypothyroidism, unspecified: Secondary | ICD-10-CM | POA: Diagnosis not present

## 2018-08-05 DIAGNOSIS — I10 Essential (primary) hypertension: Secondary | ICD-10-CM | POA: Diagnosis not present

## 2018-08-07 NOTE — Progress Notes (Signed)
Cathy Hines, Cathy Hines ([id number]) Visit Report for 08/04/2018 Arrival Information Details Patient Name: Cathy Hines Date of Service: 08/04/2018 12:45 PM Medical Record Number: [id number] Patient Account Number: [id number] Date of Birth/Sex: 1945-11-25 (72 y.o. F) Treating RN: Cornell Barman Primary Care Brina Umeda: Emily Filbert Other Clinician: Referring Jaqulyn Chancellor: Emily Filbert Treating Cienna Dumais/Extender: Tito Dine in Treatment: 3 Visit Information History Since Last Visit Added or deleted any medications: No Patient Arrived: Ambulatory Any new allergies or adverse reactions: No Arrival Time: 12:37 Had a fall or experienced change in No Accompanied By: granddaughter activities of daily living that may affect Transfer Assistance: None risk of falls: Patient Identification Verified: Yes Signs or symptoms of abuse/neglect since last visito No Secondary Verification Process Completed: Yes Hospitalized since last visit: No Patient Has Alerts: Yes Implantable device outside of the clinic excluding No Patient Alerts: DMII cellular tissue based products placed in the center since last visit: Has Dressing in Place as Prescribed: Yes Pain Present Now: No Electronic Signature(s) Signed: 08/04/2018 3:44:28 PM By: Lorine Bears RCP, RRT, CHT Entered By: Becky Sax, Amado Nash on 08/04/2018 12:40:07 Cathy Hines ([id number]) -------------------------------------------------------------------------------- Clinic Level of Care Assessment Details Patient Name: Cathy Hines Date of Service: 08/04/2018 12:45 PM Medical Record Number: [id number] Patient Account Number: [id number] Date of Birth/Sex: 1946/04/27 (72 y.o. F) Treating RN: Cornell Barman Primary Care Xianna Siverling: Emily Filbert Other Clinician: Referring Takerra Lupinacci: Emily Filbert Treating Camay Pedigo/Extender: Tito Dine in Treatment: 3 Clinic Level of Care Assessment Items TOOL 4 Quantity  Score []  - Use when only an EandM is performed on FOLLOW-UP visit 0 ASSESSMENTS - Nursing Assessment / Reassessment []  - Reassessment of Co-morbidities (includes updates in patient status) 0 X- 1 5 Reassessment of Adherence to Treatment Plan ASSESSMENTS - Wound and Skin Assessment / Reassessment X - Simple Wound Assessment / Reassessment - one wound 1 5 []  - 0 Complex Wound Assessment / Reassessment - multiple wounds []  - 0 Dermatologic / Skin Assessment (not related to wound area) ASSESSMENTS - Focused Assessment []  - Circumferential Edema Measurements - multi extremities 0 []  - 0 Nutritional Assessment / Counseling / Intervention []  - 0 Lower Extremity Assessment (monofilament, tuning fork, pulses) []  - 0 Peripheral Arterial Disease Assessment (using hand held doppler) ASSESSMENTS - Ostomy and/or Continence Assessment and Care []  - Incontinence Assessment and Management 0 []  - 0 Ostomy Care Assessment and Management (repouching, etc.) PROCESS - Coordination of Care X - Simple Patient / Family Education for ongoing care 1 15 []  - 0 Complex (extensive) Patient / Family Education for ongoing care []  - 0 Staff obtains Programmer, systems, Records, Test Results / Process Orders []  - 0 Staff telephones HHA, Nursing Homes / Clarify orders / etc []  - 0 Routine Transfer to another Facility (non-emergent condition) []  - 0 Routine Hospital Admission (non-emergent condition) []  - 0 New Admissions / Biomedical engineer / Ordering NPWT, Apligraf, etc. []  - 0 Emergency Hospital Admission (emergent condition) X- 1 10 Simple Discharge Coordination Sager, Brittlyn L. ([id number]) []  - 0 Complex (extensive) Discharge Coordination PROCESS - Special Needs []  - Pediatric / Minor Patient Management 0 []  - 0 Isolation Patient Management []  - 0 Hearing / Language / Visual special needs []  - 0 Assessment of Community assistance (transportation, D/C planning, etc.) []  - 0 Additional assistance /  Altered mentation []  - 0 Support Surface(s) Assessment (bed, cushion, seat, etc.) INTERVENTIONS - Wound Cleansing / Measurement X - Simple Wound Cleansing - one wound 1 5 []  - 0  Complex Wound Cleansing - multiple wounds X- 1 5 Wound Imaging (photographs - any number of wounds) []  - 0 Wound Tracing (instead of photographs) X- 1 5 Simple Wound Measurement - one wound []  - 0 Complex Wound Measurement - multiple wounds INTERVENTIONS - Wound Dressings []  - Small Wound Dressing one or multiple wounds 0 X- 1 15 Medium Wound Dressing one or multiple wounds []  - 0 Large Wound Dressing one or multiple wounds []  - 0 Application of Medications - topical []  - 0 Application of Medications - injection INTERVENTIONS - Miscellaneous []  - External ear exam 0 []  - 0 Specimen Collection (cultures, biopsies, blood, body fluids, etc.) []  - 0 Specimen(s) / Culture(s) sent or taken to Lab for analysis []  - 0 Patient Transfer (multiple staff / Civil Service fast streamer / Similar devices) []  - 0 Simple Staple / Suture removal (25 or less) []  - 0 Complex Staple / Suture removal (26 or more) []  - 0 Hypo / Hyperglycemic Management (close monitor of Blood Glucose) []  - 0 Ankle / Brachial Index (ABI) - do not check if billed separately X- 1 5 Vital Signs Cathy Hines, Cathy L. ([id number]) Has the patient been seen at the hospital within the last three years: Yes Total Score: 70 Level Of Care: New/Established - Level 2 Electronic Signature(s) Signed: 08/05/2018 5:07:26 PM By: Gretta Cool, BSN, RN, CWS, Kim RN, BSN Entered By: Gretta Cool, BSN, RN, CWS, Kim on 08/04/2018 13:11:38 Cathy Hines ([id number]) -------------------------------------------------------------------------------- Encounter Discharge Information Details Patient Name: Cathy Hines Date of Service: 08/04/2018 12:45 PM Medical Record Number: [id number] Patient Account Number: [id number] Date of Birth/Sex: 1946-05-31 (72 y.o. F) Treating RN: Cornell Barman Primary Care Javonne Dorko: Emily Filbert Other Clinician: Referring Zelta Enfield: Emily Filbert Treating Dailee Manalang/Extender: Tito Dine in Treatment: 3 Encounter Discharge Information Items Discharge Condition: Stable Ambulatory Status: Ambulatory Discharge Destination: Home Transportation: Private Auto Accompanied By: daughter Schedule Follow-up Appointment: Yes Clinical Summary of Care: Electronic Signature(s) Signed: 08/05/2018 5:07:26 PM By: Gretta Cool, BSN, RN, CWS, Kim RN, BSN Entered By: Gretta Cool, BSN, RN, CWS, Kim on 08/04/2018 13:12:54 Cathy Hines ([id number]) -------------------------------------------------------------------------------- Lower Extremity Assessment Details Patient Name: Cathy Hines Date of Service: 08/04/2018 12:45 PM Medical Record Number: [id number] Patient Account Number: [id number] Date of Birth/Sex: February 26, 1946 (72 y.o. F) Treating RN: Secundino Ginger Primary Care Nollie Shiflett: Emily Filbert Other Clinician: Referring Corby Villasenor: Emily Filbert Treating Kennen Stammer/Extender: Tito Dine in Treatment: 3 Edema Assessment Assessed: [Left: No] [Right: No] Edema: [Left: N] [Right: o] Calf Left: Right: Point of Measurement: 32 cm From Medial Instep 36.5 cm cm Ankle Left: Right: Point of Measurement: 10 cm From Medial Instep 20 cm cm Vascular Assessment Claudication: Claudication Assessment [Left:None] Pulses: Dorsalis Pedis Palpable: [Left:Yes] Posterior Tibial Extremity colors, hair growth, and conditions: Extremity Color: [Left:Normal] Hair Growth on Extremity: [Left:No] Temperature of Extremity: [Left:Warm] Capillary Refill: [Left:< 3 seconds] Toe Nail Assessment Left: Right: Thick: No Discolored: No Deformed: No Improper Length and Hygiene: No Electronic Signature(s) Signed: 08/04/2018 1:04:55 PM By: Secundino Ginger Entered By: Secundino Ginger on 08/04/2018 12:55:51 Cathy Hines  ([id number]) -------------------------------------------------------------------------------- Multi Wound Chart Details Patient Name: Cathy Hines Date of Service: 08/04/2018 12:45 PM Medical Record Number: [id number] Patient Account Number: [id number] Date of Birth/Sex: 1946/11/06 (72 y.o. F) Treating RN: Cornell Barman Primary Care Jenaye Rickert: Emily Filbert Other Clinician: Referring Ewald Beg: Emily Filbert Treating Amanda Pote/Extender: Tito Dine in Treatment: 3 Vital Signs Height(in): 59 Pulse(bpm): 75 Weight(lbs): 160 Blood Pressure(mmHg): 140/49 Body Mass Index(BMI): 32 Temperature(F): 97.9  Respiratory Rate 16 (breaths/min): Photos: [N/A:N/A] Wound Location: Left Lower Leg - Anterior N/A N/A Wounding Event: Trauma N/A N/A Primary Etiology: Trauma, Other N/A N/A Comorbid History: Cataracts, Chronic sinus N/A N/A problems/congestion, Lymphedema, Asthma, Sleep Apnea, Angina, Arrhythmia, Hypertension, Type II Diabetes, Osteoarthritis, Neuropathy Date Acquired: 07/09/2018 N/A N/A Weeks of Treatment: 3 N/A N/A Wound Status: Open N/A N/A Measurements L x W x D 1.2x2.9x0.2 N/A N/A (cm) Area (cm) : 2.733 N/A N/A Volume (cm) : 0.547 N/A N/A % Reduction in Area: 92.30% N/A N/A % Reduction in Volume: 84.50% N/A N/A Classification: Partial Thickness N/A N/A Exudate Amount: Small N/A N/A Exudate Type: Serous N/A N/A Exudate Color: amber N/A N/A Wound Margin: Flat and Intact N/A N/A Granulation Amount: Small (1-33%) N/A N/A Granulation Quality: Red, Pink N/A N/A Necrotic Amount: Medium (34-66%) N/A N/A Necrotic Tissue: Eschar N/A N/A Exposed Structures: N/A N/A Cathy Hines, Cathy L. ([id number]) Fat Layer (Subcutaneous Tissue) Exposed: Yes Fascia: No Tendon: No Muscle: No Joint: No Bone: No Epithelialization: Medium (34-66%) N/A N/A Periwound Skin Texture: Excoriation: No N/A N/A Induration: No Callus: No Crepitus: No Rash: No Scarring: No Periwound Skin  Moisture: Maceration: No N/A N/A Dry/Scaly: No Periwound Skin Color: Atrophie Blanche: No N/A N/A Cyanosis: No Ecchymosis: No Erythema: No Hemosiderin Staining: No Mottled: No Pallor: No Rubor: No Temperature: No Abnormality N/A N/A Tenderness on Palpation: No N/A N/A Wound Preparation: Ulcer Cleansing: N/A N/A Rinsed/Irrigated with Saline Topical Anesthetic Applied: Other: lidocaine 4% Treatment Notes Wound #2 (Left, Anterior Lower Leg) 1. Cleansed with: Clean wound with Normal Saline 2. Anesthetic Topical Lidocaine 4% cream to wound bed prior to debridement 4. Dressing Applied: Other dressing (specify in notes) Notes Mepitel One, Hydrofera Blue, Bordered Foam Dressing Electronic Signature(s) Signed: 08/04/2018 4:51:54 PM By: Linton Ham MD Entered By: Linton Ham on 08/04/2018 13:27:59 Cathy Hines ([id number]) -------------------------------------------------------------------------------- [place] Details Patient Name: Cathy Hines Date of Service: 08/04/2018 12:45 PM Medical Record Number: [id number] Patient Account Number: [id number] Date of Birth/Sex: Apr 16, 1946 (72 y.o. F) Treating RN: Cornell Barman Primary Care Eretria Manternach: Emily Filbert Other Clinician: Referring Breelynn Bankert: Emily Filbert Treating Delorise Hunkele/Extender: Tito Dine in Treatment: 3 Active Inactive ` Abuse / Safety / Falls / Self Care Management Nursing Diagnoses: Knowledge deficit related to: safety; personal, health (wound), emergency Goals: Patient/caregiver will verbalize understanding of skin care regimen Date Initiated: 07/14/2018 Target Resolution Date: 08/14/2018 Goal Status: Active Interventions: Podiatry chair, stretcher in low position and side rails up as needed Notes: ` Orientation to the Wound Care Program Nursing Diagnoses: Knowledge deficit related to the wound healing center program Goals: Patient/caregiver will verbalize understanding  of the [facility] Date Initiated: 07/14/2018 Target Resolution Date: 08/14/2018 Goal Status: Active Interventions: Provide education on orientation to the wound center Notes: ` Pain, Acute or Chronic Nursing Diagnoses: Pain, acute or chronic: actual or potential Goals: Patient will verbalize adequate pain control and receive pain control interventions during procedures as needed Date Initiated: 07/14/2018 Target Resolution Date: 08/14/2018 Goal Status: Active Interventions: Cathy Hines, RIGAUD. ([id number]) Assess comfort goal upon admission Treatment Activities: Administer pain control measures as ordered : 07/14/2018 Notes: ` Wound/Skin Impairment Nursing Diagnoses: Impaired tissue integrity Goals: Ulcer/skin breakdown will have a volume reduction of 30% by week 4 Date Initiated: 07/14/2018 Target Resolution Date: 08/14/2018 Goal Status: Active Interventions: Provide education on ulcer and skin care Treatment Activities: Referred to DME Timeka Goette for dressing supplies : 07/14/2018 Notes: Electronic Signature(s) Signed: 08/05/2018 5:07:26 PM By: Gretta Cool, BSN,  RN, CWS, Kim RN, BSN Entered By: Gretta Cool, BSN, RN, CWS, Kim on 08/04/2018 13:07:09 Cathy Hines ([id number]) -------------------------------------------------------------------------------- Pain Assessment Details Patient Name: Cathy Hines Date of Service: 08/04/2018 12:45 PM Medical Record Number: [id number] Patient Account Number: [id number] Date of Birth/Sex: 1946-08-05 (72 y.o. F) Treating RN: Cornell Barman Primary Care Cal Gindlesperger: Emily Filbert Other Clinician: Referring Darlen Gledhill: Emily Filbert Treating Greely Atiyeh/Extender: Tito Dine in Treatment: 3 Active Problems Location of Pain Severity and Description of Pain Patient Has Paino No Site Locations Pain Management and Medication Current Pain Management: Electronic Signature(s) Signed: 08/04/2018 3:44:28 PM By: Lorine Bears RCP, RRT, CHT Signed: 08/05/2018 5:07:26 PM By: Gretta Cool, BSN, RN, CWS, Kim RN, BSN Entered By: Lorine Bears on 08/04/2018 12:39:56 Cathy Hines ([id number]) -------------------------------------------------------------------------------- Patient/Caregiver Education Details Patient Name: Cathy Hines Date of Service: 08/04/2018 12:45 PM Medical Record Number: [id number] Patient Account Number: [id number] Date of Birth/Gender: Nov 05, 1946 (72 y.o. F) Treating RN: Cornell Barman Primary Care Physician: Emily Filbert Other Clinician: Referring Physician: Emily Filbert Treating Physician/Extender: Tito Dine in Treatment: 3 Education Assessment Education Provided To: Patient Education Topics Provided Wound/Skin Impairment: Handouts: Caring for Your Ulcer, Other: continue wound care as prescribed Methods: Demonstration, Explain/Verbal Responses: State content correctly Electronic Signature(s) Signed: 08/05/2018 5:07:26 PM By: Gretta Cool, BSN, RN, CWS, Kim RN, BSN Entered By: Gretta Cool, BSN, RN, CWS, Kim on 08/04/2018 13:13:12 Cathy Hines ([id number]) -------------------------------------------------------------------------------- Wound Assessment Details Patient Name: Cathy Hines Date of Service: 08/04/2018 12:45 PM Medical Record Number: [id number] Patient Account Number: [id number] Date of Birth/Sex: 17-Nov-1946 (72 y.o. F) Treating RN: Secundino Ginger Primary Care Zilah Villaflor: Emily Filbert Other Clinician: Referring Shonda Mandarino: Emily Filbert Treating Devontre Siedschlag/Extender: Tito Dine in Treatment: 3 Wound Status Wound Number: 2 Primary Trauma, Other Etiology: Wound Location: Left Lower Leg - Anterior Wound Open Wounding Event: Trauma Status: Date Acquired: 07/09/2018 Comorbid Cataracts, Chronic sinus problems/congestion, Weeks Of Treatment: 3 History: Lymphedema, Asthma, Sleep Apnea, Angina, Clustered Wound: No Arrhythmia, Hypertension, Type II  Diabetes, Osteoarthritis, Neuropathy Photos Photo Uploaded By: Secundino Ginger on 08/04/2018 13:01:11 Wound Measurements Length: (cm) 1.2 Width: (cm) 2.9 Depth: (cm) 0.2 Area: (cm) 2.733 Volume: (cm) 0.547 % Reduction in Area: 92.3% % Reduction in Volume: 84.5% Epithelialization: Medium (34-66%) Tunneling: No Undermining: No Wound Description Classification: Partial Thickness Foul Odor Wound Margin: Flat and Intact Slough/Fi Exudate Amount: Small Exudate Type: Serous Exudate Color: amber After Cleansing: No brino No Wound Bed Granulation Amount: Small (1-33%) Exposed Structure Granulation Quality: Red, Pink Fascia Exposed: No Necrotic Amount: Medium (34-66%) Fat Layer (Subcutaneous Tissue) Exposed: Yes Necrotic Quality: Eschar Tendon Exposed: No Muscle Exposed: No Joint Exposed: No Bone Exposed: No Cathy Hines, Cathy L. ([id number]) Periwound Skin Texture Texture Color No Abnormalities Noted: No No Abnormalities Noted: No Callus: No Atrophie Blanche: No Crepitus: No Cyanosis: No Excoriation: No Ecchymosis: No Induration: No Erythema: No Rash: No Hemosiderin Staining: No Scarring: No Mottled: No Pallor: No Moisture Rubor: No No Abnormalities Noted: No Dry / Scaly: No Temperature / Pain Maceration: No Temperature: No Abnormality Wound Preparation Ulcer Cleansing: Rinsed/Irrigated with Saline Topical Anesthetic Applied: Other: lidocaine 4%, Treatment Notes Wound #2 (Left, Anterior Lower Leg) 1. Cleansed with: Clean wound with Normal Saline 2. Anesthetic Topical Lidocaine 4% cream to wound bed prior to debridement 4. Dressing Applied: Other dressing (specify in notes) Notes Mepitel One, Hydrofera Blue, Bordered Foam Dressing Electronic Signature(s) Signed: 08/04/2018 1:04:55 PM By: Secundino Ginger Entered By: Secundino Ginger on 08/04/2018 12:54:18 Cathy Hines, Cathy L. (  [id number]) -------------------------------------------------------------------------------- Vitals  Details Patient Name: Cathy Hines Date of Service: 08/04/2018 12:45 PM Medical Record Number: [id number] Patient Account Number: [id number] Date of Birth/Sex: [birth year] (72 y.o. F) Treating RN: Cornell Barman Primary Care Edie Vallandingham: Emily Filbert Other Clinician: Referring Nilani Hugill: Emily Filbert Treating Jaylenn Baiza/Extender: Tito Dine in Treatment: 3 Vital Signs Time Taken: 12:40 Temperature (F): 97.9 Height (in): 59 Pulse (bpm): 75 Weight (lbs): 160 Respiratory Rate (breaths/min): 16 Body Mass Index (BMI): 32.3 Blood Pressure (mmHg): 140/49 Reference Range: 80 - 120 mg / dl Electronic Signature(s) Signed: 08/04/2018 3:44:28 PM By: Lorine Bears RCP, RRT, CHT Entered By: Becky Sax, Amado Nash on 08/04/2018 12:42:26

## 2018-08-07 NOTE — Progress Notes (Signed)
ASMARA, BACKS ([id number]) Visit Report for 08/04/2018 HPI Details Patient Name: Cathy Hines, Cathy Hines Date of Service: 08/04/2018 12:45 PM Medical Record Number: [id number] Patient Account Number: [id number] Date of Birth/Sex: Jan 11, 1946 (72 y.o. F) Treating RN: Cornell Barman Primary Care Provider: Emily Filbert Other Clinician: Referring Provider: Emily Filbert Treating Provider/Extender: Tito Dine in Treatment: 3 History of Present Illness HPI Description: 04/07/18 ADMISSION This is a 72 year old woman with type 2 diabetes and a history of diabetic neuropathy. About a month ago she fell while walking in her yard and suffered a laceration of her right anterior shin over the tibia. She tried to re-adhere the skin flaps. She went to see her primary physician on 03/11/18 was given antibiotic ointment, Telfa and Coban and was prescribed doxycycline. I believe she had an x-ray ordered that did not show up particular abnormality. On 03/15/18 Keflex was added. Most recently she has been putting Betadine on this and Vaseline and was told to soak this in Epson salts. She does not have a wound history. She has type 2 diabetes with neuropathy, hyperlipidemia hypertension hypothyroidism mitral valve prolapse and atrial fibrillation, osteoarthritis and asthma. We could not test her ABIs on either side because of complaints about pain, even the left side doesn't have a wound 04/14/18; she comes in today telling us that the debridement I did last week caused her to vomit when she got home. We are using Iodoflex to the wound as the primary dressing under Kerlix and Coban. She also had complaints about the wrap the heat etc. 04/21/18; using Iodoflex to this traumatic area on the right anterior shin. Arise with a better looking surface although still a difficult amount of tightly adherent fibrinous debris. 04/28/18; were using to flex to the traumatic area on her right anterior shin. The wound is contracted.  She does not want compression on today. Still some adherent fibrinous debris over the surface of this. 05/05/18; Hydrofera Blue to the traumatic wound on the right anterior shin. The wound is contracting. 05/12/18; been using Hydrofera Blue to the traumatic wound on the right anterior shin this is totally closed today. READMISSION 07/14/18 Patient is here today with a one-week history of traumatic wounds on the left anterior shin area. This occurred while she was lifting sheet metal out of the trunk of her car with a part of it falling on her left anterior ankle. She has 4 open areas all and close juxtaposition. The top 3 are minor. She has a laceration/skin tear inferiorly in the wound area. This is horizontal some surface debris. She is complaining of a lot of pain although none in these wounds look like they should be that painful. She was seen in urgent care after the incident was not given any antibiotics. She was not x-rayed. She has been applying Silvadene that she got from a friend and feels that helped with the pain. she is previously here with a traumatic wound on her right anterior tibial area just 2 months ago. The left leg was too painful to do ABIs on today although it was noncompressible when she was here the last time. Today the right leg is noncompressible 07/21/18; the patient took her left leg wrap off 2 or 3 days ago. Since then she's been using Neosporin. This was only a Kerlix Coban wrap and should not have been causing her that much trouble. She arrives today with nonviable surface eschar requiring debridement 08/04/18; 2 week follow-up. The patient has been using [facility]  under border foam. She has a listed complaints however her wound is way down in dimension. She did not tolerate any form of compression but she doesn't have a lot of edema KIMARA, BENCOMO ([id number]) Electronic Signature(s) Signed: 08/04/2018 4:51:54 PM By: Linton Ham MD Entered By: Linton Ham on 08/04/2018 13:29:02 Cathy Hines ([id number]) -------------------------------------------------------------------------------- Physical Exam Details Patient Name: Cathy Hines Date of Service: 08/04/2018 12:45 PM Medical Record Number: [id number] Patient Account Number: [id number] Date of Birth/Sex: Jun 10, 1946 (72 y.o. F) Treating RN: Cornell Barman Primary Care Provider: Emily Filbert Other Clinician: Referring Provider: Emily Filbert Treating Provider/Extender: Tito Dine in Treatment: 3 Constitutional Sitting or standing Blood Pressure is within target range for patient.. Pulse regular and within target range for patient.Marland Kitchen Respirations regular, non-labored and within target range.. Temperature is normal and within the target range for the patient.Marland Kitchen appears in no distress. Respiratory Respiratory effort is easy and symmetric bilaterally. Rate is normal at rest and on room air.. Cardiovascular pedal pulses are palpable. Lymphatic none palpable in the popliteal area bilaterally. Integumentary (Hair, Skin) no primary skin issues are seen. Psychiatric No evidence of depression, anxiety, or agitation. Calm, cooperative, and communicative. Appropriate interactions and affect.. Notes 08/04/18 with exam; left anterior tibial area. The wound is markedly down in terms of size. No debridement was required Electronic Signature(s) Signed: 08/04/2018 4:51:54 PM By: Linton Ham MD Entered By: Linton Ham on 08/04/2018 13:35:07 Cathy Hines ([id number]) -------------------------------------------------------------------------------- Physician Orders Details Patient Name: Cathy Hines Date of Service: 08/04/2018 12:45 PM Medical Record Number: [id number] Patient Account Number: [id number] Date of Birth/Sex: [birth year] (72 y.o. F) Treating RN: Cornell Barman Primary Care Provider: Emily Filbert Other Clinician: Referring Provider: Emily Filbert Treating  Provider/Extender: Tito Dine in Treatment: 3 Verbal / Phone Orders: No Diagnosis Coding Wound Cleansing Wound #2 Left,Anterior Lower Leg o Clean wound with Normal Saline. Anesthetic (add to Medication List) Wound #2 Left,Anterior Lower Leg o Topical Lidocaine 4% cream applied to wound bed prior to debridement (In Clinic Only). Primary Wound Dressing Wound #2 Left,Anterior Lower Leg o Hydrafera Blue Ready Transfer - Mepitel One under Secondary Dressing Wound #2 Left,Anterior Lower Leg o Other - telfa island Dressing Change Frequency Wound #2 Left,Anterior Lower Leg o Change Dressing Monday, Wednesday, Friday Follow-up Appointments Wound #2 Left,Anterior Lower Leg o Return Appointment in 1 week. Electronic Signature(s) Signed: 08/04/2018 4:51:54 PM By: Linton Ham MD Signed: 08/05/2018 5:07:26 PM By: Gretta Cool, BSN, RN, CWS, Kim RN, BSN Entered By: Gretta Cool, BSN, RN, CWS, Kim on 08/04/2018 13:07:44 ALYDA, MEGNA ([id number]) -------------------------------------------------------------------------------- Problem List Details Patient Name: Cathy Hines Date of Service: 08/04/2018 12:45 PM Medical Record Number: [id number] Patient Account Number: [id number] Date of Birth/Sex: [birth year] (72 y.o. F) Treating RN: Cornell Barman Primary Care Provider: Emily Filbert Other Clinician: Referring Provider: Emily Filbert Treating Provider/Extender: Tito Dine in Treatment: 3 Active Problems ICD-10 Evaluated Encounter Code Description Active Date Today Diagnosis S81.812D Laceration without foreign body, left lower leg, subsequent 07/14/2018 No Yes encounter E11.622 Type 2 diabetes mellitus with other skin ulcer 07/14/2018 No Yes L97.821 Non-pressure chronic ulcer of other part of left lower leg 07/14/2018 No Yes limited to breakdown of skin Inactive Problems Resolved Problems Electronic Signature(s) Signed: 08/04/2018 4:51:54 PM By: Linton Ham  MD Entered By: Linton Ham on 08/04/2018 13:27:47 Cathy Hines ([id number]) -------------------------------------------------------------------------------- Progress Note Details Patient Name: Cathy Hines Date of Service: 08/04/2018 12:45 PM Medical Record Number: [id number] Patient Account Number:  [id number] Date of Birth/Sex: 1946-03-11 (72 y.o. F) Treating RN: Cornell Barman Primary Care Provider: Emily Filbert Other Clinician: Referring Provider: Emily Filbert Treating Provider/Extender: Tito Dine in Treatment: 3 Subjective History of Present Illness (HPI) 04/07/18 ADMISSION This is a 72 year old woman with type 2 diabetes and a history of diabetic neuropathy. About a month ago she fell while walking in her yard and suffered a laceration of her right anterior shin over the tibia. She tried to re-adhere the skin flaps. She went to see her primary physician on 03/11/18 was given antibiotic ointment, Telfa and Coban and was prescribed doxycycline. I believe she had an x-ray ordered that did not show up particular abnormality. On 03/15/18 Keflex was added. Most recently she has been putting Betadine on this and Vaseline and was told to soak this in Epson salts. She does not have a wound history. She has type 2 diabetes with neuropathy, hyperlipidemia hypertension hypothyroidism mitral valve prolapse and atrial fibrillation, osteoarthritis and asthma. We could not test her ABIs on either side because of complaints about pain, even the left side doesn't have a wound 04/14/18; she comes in today telling us that the debridement I did last week caused her to vomit when she got home. We are using Iodoflex to the wound as the primary dressing under Kerlix and Coban. She also had complaints about the wrap the heat etc. 04/21/18; using Iodoflex to this traumatic area on the right anterior shin. Arise with a better looking surface although still a difficult amount of tightly adherent  fibrinous debris. 04/28/18; were using to flex to the traumatic area on her right anterior shin. The wound is contracted. She does not want compression on today. Still some adherent fibrinous debris over the surface of this. 05/05/18; Hydrofera Blue to the traumatic wound on the right anterior shin. The wound is contracting. 05/12/18; been using Hydrofera Blue to the traumatic wound on the right anterior shin this is totally closed today. READMISSION 07/14/18 Patient is here today with a one-week history of traumatic wounds on the left anterior shin area. This occurred while she was lifting sheet metal out of the trunk of her car with a part of it falling on her left anterior ankle. She has 4 open areas all and close juxtaposition. The top 3 are minor. She has a laceration/skin tear inferiorly in the wound area. This is horizontal some surface debris. She is complaining of a lot of pain although none in these wounds look like they should be that painful. She was seen in urgent care after the incident was not given any antibiotics. She was not x-rayed. She has been applying Silvadene that she got from a friend and feels that helped with the pain. she is previously here with a traumatic wound on her right anterior tibial area just 2 months ago. The left leg was too painful to do ABIs on today although it was noncompressible when she was here the last time. Today the right leg is noncompressible 07/21/18; the patient took her left leg wrap off 2 or 3 days ago. Since then she's been using Neosporin. This was only a Kerlix Coban wrap and should not have been causing her that much trouble. She arrives today with nonviable surface eschar requiring debridement 08/04/18; 2 week follow-up. The patient has been using Hydrofera Blue under border foam. She has a listed complaints however her wound is way down in dimension. She did not tolerate any form of compression but she doesn't have  a lot of edema Croak,  Safiyyah L. ([id number]) Objective Constitutional Sitting or standing Blood Pressure is within target range for patient.. Pulse regular and within target range for patient.Marland Kitchen Respirations regular, non-labored and within target range.. Temperature is normal and within the target range for the patient.Marland Kitchen appears in no distress. Vitals Time Taken: 12:40 PM, Height: 59 in, Weight: 160 lbs, BMI: 32.3, Temperature: 97.9 F, Pulse: 75 bpm, Respiratory Rate: 16 breaths/min, Blood Pressure: 140/49 mmHg. Respiratory Respiratory effort is easy and symmetric bilaterally. Rate is normal at rest and on room air.. Cardiovascular pedal pulses are palpable. Lymphatic none palpable in the popliteal area bilaterally. Psychiatric No evidence of depression, anxiety, or agitation. Calm, cooperative, and communicative. Appropriate interactions and affect.. General Notes: 08/04/18 with exam; left anterior tibial area. The wound is markedly down in terms of size. No debridement was required Integumentary (Hair, Skin) no primary skin issues are seen. Wound #2 status is Open. Original cause of wound was Trauma. The wound is located on the Left,Anterior Lower Leg. The wound measures 1.2cm length x 2.9cm width x 0.2cm depth; 2.733cm^2 area and 0.547cm^3 volume. There is Fat Layer (Subcutaneous Tissue) Exposed exposed. There is no tunneling or undermining noted. There is a small amount of serous drainage noted. The wound margin is flat and intact. There is small (1-33%) red, pink granulation within the wound bed. There is a medium (34-66%) amount of necrotic tissue within the wound bed including Eschar. The periwound skin appearance did not exhibit: Callus, Crepitus, Excoriation, Induration, Rash, Scarring, Dry/Scaly, Maceration, Atrophie Blanche, Cyanosis, Ecchymosis, Hemosiderin Staining, Mottled, Pallor, Rubor, Erythema. Periwound temperature was noted as No Abnormality. Assessment Active Problems ICD-10 Laceration  without foreign body, left lower leg, subsequent encounter Type 2 diabetes mellitus with other skin ulcer Non-pressure chronic ulcer of other part of left lower leg limited to breakdown of skin Groll, Castella L. ([id number]) Plan Wound Cleansing: Wound #2 Left,Anterior Lower Leg: Clean wound with Normal Saline. Anesthetic (add to Medication List): Wound #2 Left,Anterior Lower Leg: Topical Lidocaine 4% cream applied to wound bed prior to debridement (In Clinic Only). Primary Wound Dressing: Wound #2 Left,Anterior Lower Leg: Hydrafera Blue Ready Transfer - Mepitel One under Secondary Dressing: Wound #2 Left,Anterior Lower Leg: Other - telfa island Dressing Change Frequency: Wound #2 Left,Anterior Lower Leg: Change Dressing Monday, Wednesday, Friday Follow-up Appointments: Wound #2 Left,Anterior Lower Leg: Return Appointment in 1 week. #1 Hydrofera Blue/Telfa #2 follow-up in one week Electronic Signature(s) Signed: 08/04/2018 4:51:54 PM By: Linton Ham MD Entered By: Linton Ham on 08/04/2018 13:35:48 Cathy Hines ([id number]) -------------------------------------------------------------------------------- [place] Details Patient Name: Cathy Hines Date of Service: 08/04/2018 Medical Record Number: [id number] Patient Account Number: [id number] Date of Birth/Sex: 01-02-1946 (72 y.o. F) Treating RN: Cornell Barman Primary Care Provider: Emily Filbert Other Clinician: Referring Provider: Emily Filbert Treating Provider/Extender: Tito Dine in Treatment: 3 Diagnosis Coding ICD-10 Codes Code Description [phone] Laceration without foreign body, left lower leg, subsequent encounter E11.622 Type 2 diabetes mellitus with other skin ulcer L97.821 Non-pressure chronic ulcer of other part of left lower leg limited to breakdown of skin Facility Procedures CPT4 Code: [id number] Description: [phone] - WOUND CARE VISIT-LEV 2 EST PT Modifier: Quantity: 1 Physician  Procedures CPT4 Code: [id number] Description: 67124 - WC PHYS LEVEL 3 - EST PT ICD-10 Diagnosis Description S81.812D Laceration without foreign body, left lower leg, subsequent E11.622 Type 2 diabetes mellitus with other skin ulcer Modifier: encounter Quantity: 1 Electronic Signature(s) Signed: 08/04/2018 4:51:54 PM By: Linton Ham MD  Entered By: Linton Ham on 08/04/2018 13:36:13

## 2018-08-11 ENCOUNTER — Encounter: Payer: Medicare HMO | Admitting: Internal Medicine

## 2018-08-11 DIAGNOSIS — Z0000 Encounter for general adult medical examination without abnormal findings: Secondary | ICD-10-CM | POA: Diagnosis not present

## 2018-08-11 DIAGNOSIS — E119 Type 2 diabetes mellitus without complications: Secondary | ICD-10-CM | POA: Diagnosis not present

## 2018-08-11 DIAGNOSIS — S91002A Unspecified open wound, left ankle, initial encounter: Secondary | ICD-10-CM | POA: Diagnosis not present

## 2018-08-11 DIAGNOSIS — I10 Essential (primary) hypertension: Secondary | ICD-10-CM | POA: Diagnosis not present

## 2018-08-11 DIAGNOSIS — E039 Hypothyroidism, unspecified: Secondary | ICD-10-CM | POA: Diagnosis not present

## 2018-08-11 DIAGNOSIS — E785 Hyperlipidemia, unspecified: Secondary | ICD-10-CM | POA: Diagnosis not present

## 2018-08-11 DIAGNOSIS — E1140 Type 2 diabetes mellitus with diabetic neuropathy, unspecified: Secondary | ICD-10-CM | POA: Diagnosis not present

## 2018-08-11 DIAGNOSIS — M1990 Unspecified osteoarthritis, unspecified site: Secondary | ICD-10-CM | POA: Diagnosis not present

## 2018-08-11 DIAGNOSIS — I341 Nonrheumatic mitral (valve) prolapse: Secondary | ICD-10-CM | POA: Diagnosis not present

## 2018-08-11 DIAGNOSIS — I493 Ventricular premature depolarization: Secondary | ICD-10-CM | POA: Diagnosis not present

## 2018-08-11 DIAGNOSIS — I4891 Unspecified atrial fibrillation: Secondary | ICD-10-CM | POA: Diagnosis not present

## 2018-08-11 DIAGNOSIS — L97821 Non-pressure chronic ulcer of other part of left lower leg limited to breakdown of skin: Secondary | ICD-10-CM | POA: Diagnosis not present

## 2018-08-11 DIAGNOSIS — E11622 Type 2 diabetes mellitus with other skin ulcer: Secondary | ICD-10-CM | POA: Diagnosis not present

## 2018-08-13 NOTE — Progress Notes (Signed)
Cathy, Hines ([id number]) Visit Report for 08/11/2018 HPI Details Patient Name: Cathy Hines, Cathy Hines Date of Service: 08/11/2018 12:30 PM Medical Record Number: [id number] Patient Account Number: [id number] Date of Birth/Sex: [birth year] (72 y.o. F) Treating RN: Cornell Barman Primary Care Provider: Emily Filbert Other Clinician: Referring Provider: Emily Filbert Treating Provider/Extender: Tito Dine in Treatment: 4 History of Present Illness HPI Description: 04/07/18 ADMISSION This is a 72 year old woman with type 2 diabetes and a history of diabetic neuropathy. About a month ago she fell while walking in her yard and suffered a laceration of her right anterior shin over the tibia. She tried to re-adhere the skin flaps. She went to see her primary physician on 03/11/18 was given antibiotic ointment, Telfa and Coban and was prescribed doxycycline. I believe she had an x-ray ordered that did not show up particular abnormality. On 03/15/18 Keflex was added. Most recently she has been putting Betadine on this and Vaseline and was told to soak this in Epson salts. She does not have a wound history. She has type 2 diabetes with neuropathy, hyperlipidemia hypertension hypothyroidism mitral valve prolapse and atrial fibrillation, osteoarthritis and asthma. We could not test her ABIs on either side because of complaints about pain, even the left side doesn't have a wound 04/14/18; she comes in today telling us that the debridement I did last week caused her to vomit when she got home. We are using Iodoflex to the wound as the primary dressing under Kerlix and Coban. She also had complaints about the wrap the heat etc. 04/21/18; using Iodoflex to this traumatic area on the right anterior shin. Arise with a better looking surface although still a difficult amount of tightly adherent fibrinous debris. 04/28/18; were using to flex to the traumatic area on her right anterior shin. The wound is contracted.  She does not want compression on today. Still some adherent fibrinous debris over the surface of this. 05/05/18; Hydrofera Blue to the traumatic wound on the right anterior shin. The wound is contracting. 05/12/18; been using Hydrofera Blue to the traumatic wound on the right anterior shin this is totally closed today. READMISSION 07/14/18 Patient is here today with a one-week history of traumatic wounds on the left anterior shin area. This occurred while she was lifting sheet metal out of the trunk of her car with a part of it falling on her left anterior ankle. She has 4 open areas all and close juxtaposition. The top 3 are minor. She has a laceration/skin tear inferiorly in the wound area. This is horizontal some surface debris. She is complaining of a lot of pain although none in these wounds look like they should be that painful. She was seen in urgent care after the incident was not given any antibiotics. She was not x-rayed. She has been applying Silvadene that she got from a friend and feels that helped with the pain. she is previously here with a traumatic wound on her right anterior tibial area just 2 months ago. The left leg was too painful to do ABIs on today although it was noncompressible when she was here the last time. Today the right leg is noncompressible 07/21/18; the patient took her left leg wrap off 2 or 3 days ago. Since then she's been using Neosporin. This was only a Kerlix Coban wrap and should not have been causing her that much trouble. She arrives today with nonviable surface eschar requiring debridement 08/04/18; 2 week follow-up. The patient has been using [facility]  under border foam. She has a listed complaints however her wound is way down in dimension. She did not tolerate any form of compression but she doesn't have a lot of edema Schier, Lanayah L. ([id number]) 08/11/18; one-week follow-up. The patient is doing well using Hydrofera Blue under border foam. Wound  is contracting she has no complaints Electronic Signature(s) Signed: 08/11/2018 6:15:06 PM By: Linton Ham MD Entered By: Linton Ham on 08/11/2018 13:03:36 Cathy Hines ([id number]) -------------------------------------------------------------------------------- Physical Exam Details Patient Name: Cathy Hines Date of Service: 08/11/2018 12:30 PM Medical Record Number: [id number] Patient Account Number: [id number] Date of Birth/Sex: [birth year] (72 y.o. F) Treating RN: Cornell Barman Primary Care Provider: Emily Filbert Other Clinician: Referring Provider: Emily Filbert Treating Provider/Extender: Tito Dine in Treatment: 4 Constitutional Patient is hypertensive.. Pulse regular and within target range for patient.Marland Kitchen Respirations regular, non-labored and within target range.. Temperature is normal and within the target range for the patient.Marland Kitchen appears in no distress. Notes wound exam; left anterior tibial area. The wound is markedly down in terms of size. There is some light surface slough however this doesn't appear to be inhibiting wound closure and I didn't think any debridement was necessary. Her pedal pulses are palpable. There is no evidence of cellulitis Electronic Signature(s) Signed: 08/11/2018 6:15:06 PM By: Linton Ham MD Entered By: Linton Ham on 08/11/2018 13:04:31 Cathy Hines ([id number]) -------------------------------------------------------------------------------- Physician Orders Details Patient Name: Cathy Hines Date of Service: 08/11/2018 12:30 PM Medical Record Number: [id number] Patient Account Number: [id number] Date of Birth/Sex: September 12, 1946 (72 y.o. F) Treating RN: Cornell Barman Primary Care Provider: Emily Filbert Other Clinician: Referring Provider: Emily Filbert Treating Provider/Extender: Tito Dine in Treatment: 4 Verbal / Phone Orders: No Diagnosis Coding Wound Cleansing Wound #2 Left,Anterior Lower  Leg o Clean wound with Normal Saline. Anesthetic (add to Medication List) Wound #2 Left,Anterior Lower Leg o Topical Lidocaine 4% cream applied to wound bed prior to debridement (In Clinic Only). Primary Wound Dressing Wound #2 Left,Anterior Lower Leg o Hydrafera Blue Ready Transfer - Mepitel One under Secondary Dressing Wound #2 Left,Anterior Lower Leg o Other - telfa island Dressing Change Frequency Wound #2 Left,Anterior Lower Leg o Change Dressing Monday, Wednesday, Friday Follow-up Appointments Wound #2 Left,Anterior Lower Leg o Return Appointment in 1 week. Electronic Signature(s) Signed: 08/11/2018 5:31:05 PM By: Gretta Cool, BSN, RN, CWS, Kim RN, BSN Signed: 08/11/2018 6:15:06 PM By: Linton Ham MD Entered By: Gretta Cool, BSN, RN, CWS, Kim on 08/11/2018 12:54:15 SARIYA, TRICKEY ([id number]) -------------------------------------------------------------------------------- Problem List Details Patient Name: Cathy Hines Date of Service: 08/11/2018 12:30 PM Medical Record Number: [id number] Patient Account Number: [id number] Date of Birth/Sex: 04/13/1946 (72 y.o. F) Treating RN: Cornell Barman Primary Care Provider: Emily Filbert Other Clinician: Referring Provider: Emily Filbert Treating Provider/Extender: Tito Dine in Treatment: 4 Active Problems ICD-10 Evaluated Encounter Code Description Active Date Today Diagnosis S81.812D Laceration without foreign body, left lower leg, subsequent 07/14/2018 No Yes encounter E11.622 Type 2 diabetes mellitus with other skin ulcer 07/14/2018 No Yes L97.821 Non-pressure chronic ulcer of other part of left lower leg 07/14/2018 No Yes limited to breakdown of skin Inactive Problems Resolved Problems Electronic Signature(s) Signed: 08/11/2018 6:15:06 PM By: Linton Ham MD Entered By: Linton Ham on 08/11/2018 13:00:46 Cathy Hines  ([id number]) -------------------------------------------------------------------------------- Progress Note Details Patient Name: Cathy Hines Date of Service: 08/11/2018 12:30 PM Medical Record Number: [id number] Patient Account Number: [id number] Date of Birth/Sex: 16-Jun-1946 (72 y.o. F) Treating RN: Cornell Barman Primary  Care Provider: Emily Filbert Other Clinician: Referring Provider: Emily Filbert Treating Provider/Extender: Tito Dine in Treatment: 4 Subjective History of Present Illness (HPI) 04/07/18 ADMISSION This is a 72 year old woman with type 2 diabetes and a history of diabetic neuropathy. About a month ago she fell while walking in her yard and suffered a laceration of her right anterior shin over the tibia. She tried to re-adhere the skin flaps. She went to see her primary physician on 03/11/18 was given antibiotic ointment, Telfa and Coban and was prescribed doxycycline. I believe she had an x-ray ordered that did not show up particular abnormality. On 03/15/18 Keflex was added. Most recently she has been putting Betadine on this and Vaseline and was told to soak this in Epson salts. She does not have a wound history. She has type 2 diabetes with neuropathy, hyperlipidemia hypertension hypothyroidism mitral valve prolapse and atrial fibrillation, osteoarthritis and asthma. We could not test her ABIs on either side because of complaints about pain, even the left side doesn't have a wound 04/14/18; she comes in today telling us that the debridement I did last week caused her to vomit when she got home. We are using Iodoflex to the wound as the primary dressing under Kerlix and Coban. She also had complaints about the wrap the heat etc. 04/21/18; using Iodoflex to this traumatic area on the right anterior shin. Arise with a better looking surface although still a difficult amount of tightly adherent fibrinous debris. 04/28/18; were using to flex to the traumatic area on  her right anterior shin. The wound is contracted. She does not want compression on today. Still some adherent fibrinous debris over the surface of this. 05/05/18; Hydrofera Blue to the traumatic wound on the right anterior shin. The wound is contracting. 05/12/18; been using Hydrofera Blue to the traumatic wound on the right anterior shin this is totally closed today. READMISSION 07/14/18 Patient is here today with a one-week history of traumatic wounds on the left anterior shin area. This occurred while she was lifting sheet metal out of the trunk of her car with a part of it falling on her left anterior ankle. She has 4 open areas all and close juxtaposition. The top 3 are minor. She has a laceration/skin tear inferiorly in the wound area. This is horizontal some surface debris. She is complaining of a lot of pain although none in these wounds look like they should be that painful. She was seen in urgent care after the incident was not given any antibiotics. She was not x-rayed. She has been applying Silvadene that she got from a friend and feels that helped with the pain. she is previously here with a traumatic wound on her right anterior tibial area just 2 months ago. The left leg was too painful to do ABIs on today although it was noncompressible when she was here the last time. Today the right leg is noncompressible 07/21/18; the patient took her left leg wrap off 2 or 3 days ago. Since then she's been using Neosporin. This was only a Kerlix Coban wrap and should not have been causing her that much trouble. She arrives today with nonviable surface eschar requiring debridement 08/04/18; 2 week follow-up. The patient has been using Hydrofera Blue under border foam. She has a listed complaints however her wound is way down in dimension. She did not tolerate any form of compression but she doesn't have a lot of edema 08/11/18; one-week follow-up. The patient is doing well using  Hydrofera Blue under  border foam. Wound is contracting she has no complaints [place], [place] ([id number]) Objective Constitutional Patient is hypertensive.. Pulse regular and within target range for patient.Marland Kitchen Respirations regular, non-labored and within target range.. Temperature is normal and within the target range for the patient.Marland Kitchen appears in no distress. Vitals Time Taken: 12:35 PM, Height: 59 in, Weight: 160 lbs, BMI: 32.3, Temperature: 98.2 F, Pulse: 71 bpm, Respiratory Rate: 16 breaths/min, Blood Pressure: 157/63 mmHg. General Notes: wound exam; left anterior tibial area. The wound is markedly down in terms of size. There is some light surface slough however this doesn't appear to be inhibiting wound closure and I didn't think any debridement was necessary. Her pedal pulses are palpable. There is no evidence of cellulitis Integumentary (Hair, Skin) Wound #2 status is Open. Original cause of wound was Trauma. The wound is located on the Left,Anterior Lower Leg. The wound measures 1cm length x 1.3cm width x 0.2cm depth; 1.021cm^2 area and 0.204cm^3 volume. There is Fat Layer (Subcutaneous Tissue) Exposed exposed. There is no tunneling or undermining noted. There is a small amount of serous drainage noted. The wound margin is flat and intact. There is small (1-33%) red, pink granulation within the wound bed. There is a medium (34-66%) amount of necrotic tissue within the wound bed including Eschar. The periwound skin appearance did not exhibit: Callus, Crepitus, Excoriation, Induration, Rash, Scarring, Dry/Scaly, Maceration, Atrophie Blanche, Cyanosis, Ecchymosis, Hemosiderin Staining, Mottled, Pallor, Rubor, Erythema. Periwound temperature was noted as No Abnormality. Assessment Active Problems ICD-10 Laceration without foreign body, left lower leg, subsequent encounter Type 2 diabetes mellitus with other skin ulcer Non-pressure chronic ulcer of other part of left lower leg limited to breakdown of  skin Plan Wound Cleansing: Wound #2 Left,Anterior Lower Leg: Clean wound with Normal Saline. Anesthetic (add to Medication List): Wound #2 Left,Anterior Lower Leg: Topical Lidocaine 4% cream applied to wound bed prior to debridement (In Clinic Only). DARRIN, APODACA ([id number]) Primary Wound Dressing: Wound #2 Left,Anterior Lower Leg: Hydrafera Blue Ready Transfer - Mepitel One under Secondary Dressing: Wound #2 Left,Anterior Lower Leg: Other - telfa island Dressing Change Frequency: Wound #2 Left,Anterior Lower Leg: Change Dressing Monday, Wednesday, Friday Follow-up Appointments: Wound #2 Left,Anterior Lower Leg: Return Appointment in 1 week. #1 no change to the dressing #2 this may be close next week #3 I have a sense she will not agree to even support stockings Electronic Signature(s) Signed: 08/11/2018 6:15:06 PM By: Linton Ham MD Entered By: Linton Ham on 08/11/2018 13:05:09 Cathy Hines ([id number]) -------------------------------------------------------------------------------- SuperBill Details Patient Name: Cathy Hines Date of Service: 08/11/2018 Medical Record Number: [id number] Patient Account Number: [id number] Date of Birth/Sex: May 03, 1946 (72 y.o. F) Treating RN: Cornell Barman Primary Care Provider: Emily Filbert Other Clinician: Referring Provider: Emily Filbert Treating Provider/Extender: Tito Dine in Treatment: 4 Diagnosis Coding ICD-10 Codes Code Description [phone] Laceration without foreign body, left lower leg, subsequent encounter E11.622 Type 2 diabetes mellitus with other skin ulcer L97.821 Non-pressure chronic ulcer of other part of left lower leg limited to breakdown of skin Facility Procedures CPT4 Code: [id number] Description: [id number] - WOUND CARE VISIT-LEV 2 EST PT Modifier: Quantity: 1 Physician Procedures CPT4 Code Description: [id number] - WC PHYS LEVEL 2 - EST PT ICD-10 Diagnosis Description S81.812D  Laceration without foreign body, left lower leg, subsequent enc E11.622 Type 2 diabetes mellitus with other skin ulcer L97.821 Non-pressure chronic  ulcer of other part of left lower leg limi Modifier: ounter ted to breakdown  Quantity: 1 of skin Electronic Signature(s) Signed: 08/11/2018 6:15:06 PM By: Linton Ham MD Entered By: Linton Ham on 08/11/2018 13:05:34

## 2018-08-13 NOTE — Progress Notes (Signed)
PANHIA, KARL ([id number]) Visit Report for 08/11/2018 Arrival Information Details Patient Name: Cathy Hines, Cathy Hines Date of Service: 08/11/2018 12:30 PM Medical Record Number: [id number] Patient Account Number: [id number] Date of Birth/Sex: 29-Jun-1946 (72 y.o. F) Treating RN: Cornell Barman Primary Care Meli Faley: Emily Filbert Other Clinician: Referring Shuayb Schepers: Emily Filbert Treating Doneisha Ivey/Extender: Tito Dine in Treatment: 4 Visit Information History Since Last Visit Added or deleted any medications: Yes Patient Arrived: Ambulatory Any new allergies or adverse reactions: No Arrival Time: 12:35 Had a fall or experienced change in No Accompanied By: self activities of daily living that may affect Transfer Assistance: None risk of falls: Patient Identification Verified: Yes Signs or symptoms of abuse/neglect since last visito No Secondary Verification Process Completed: Yes Hospitalized since last visit: No Patient Has Alerts: Yes Implantable device outside of the clinic excluding No Patient Alerts: DMII cellular tissue based products placed in the center since last visit: Has Dressing in Place as Prescribed: Yes Pain Present Now: Yes Electronic Signature(s) Signed: 08/11/2018 1:25:39 PM By: Lorine Bears RCP, RRT, CHT Entered By: Lorine Bears on 08/11/2018 12:37:18 Cathy Hines ([id number]) -------------------------------------------------------------------------------- Clinic Level of Care Assessment Details Patient Name: Cathy Hines Date of Service: 08/11/2018 12:30 PM Medical Record Number: [id number] Patient Account Number: [id number] Date of Birth/Sex: Jan 04, 1946 (72 y.o. F) Treating RN: Cornell Barman Primary Care Sherif Millspaugh: Emily Filbert Other Clinician: Referring Tyhir Schwan: Emily Filbert Treating Sharrod Achille/Extender: Tito Dine in Treatment: 4 Clinic Level of Care Assessment Items TOOL 4 Quantity Score []  - Use  when only an EandM is performed on FOLLOW-UP visit 0 ASSESSMENTS - Nursing Assessment / Reassessment []  - Reassessment of Co-morbidities (includes updates in patient status) 0 X- 1 5 Reassessment of Adherence to Treatment Plan ASSESSMENTS - Wound and Skin Assessment / Reassessment X - Simple Wound Assessment / Reassessment - one wound 1 5 []  - 0 Complex Wound Assessment / Reassessment - multiple wounds []  - 0 Dermatologic / Skin Assessment (not related to wound area) ASSESSMENTS - Focused Assessment []  - Circumferential Edema Measurements - multi extremities 0 []  - 0 Nutritional Assessment / Counseling / Intervention []  - 0 Lower Extremity Assessment (monofilament, tuning fork, pulses) []  - 0 Peripheral Arterial Disease Assessment (using hand held doppler) ASSESSMENTS - Ostomy and/or Continence Assessment and Care []  - Incontinence Assessment and Management 0 []  - 0 Ostomy Care Assessment and Management (repouching, etc.) PROCESS - Coordination of Care X - Simple Patient / Family Education for ongoing care 1 15 []  - 0 Complex (extensive) Patient / Family Education for ongoing care []  - 0 Staff obtains Programmer, systems, Records, Test Results / Process Orders []  - 0 Staff telephones HHA, Nursing Homes / Clarify orders / etc []  - 0 Routine Transfer to another Facility (non-emergent condition) []  - 0 Routine Hospital Admission (non-emergent condition) []  - 0 New Admissions / Biomedical engineer / Ordering NPWT, Apligraf, etc. []  - 0 Emergency Hospital Admission (emergent condition) X- 1 10 Simple Discharge Coordination Cathy Hines, Cathy L. ([id number]) []  - 0 Complex (extensive) Discharge Coordination PROCESS - Special Needs []  - Pediatric / Minor Patient Management 0 []  - 0 Isolation Patient Management []  - 0 Hearing / Language / Visual special needs []  - 0 Assessment of Community assistance (transportation, D/C planning, etc.) []  - 0 Additional assistance / Altered  mentation []  - 0 Support Surface(s) Assessment (bed, cushion, seat, etc.) INTERVENTIONS - Wound Cleansing / Measurement X - Simple Wound Cleansing - one wound 1 5 []  - 0  Complex Wound Cleansing - multiple wounds X- 1 5 Wound Imaging (photographs - any number of wounds) []  - 0 Wound Tracing (instead of photographs) X- 1 5 Simple Wound Measurement - one wound []  - 0 Complex Wound Measurement - multiple wounds INTERVENTIONS - Wound Dressings []  - Small Wound Dressing one or multiple wounds 0 X- 1 15 Medium Wound Dressing one or multiple wounds []  - 0 Large Wound Dressing one or multiple wounds []  - 0 Application of Medications - topical []  - 0 Application of Medications - injection INTERVENTIONS - Miscellaneous []  - External ear exam 0 []  - 0 Specimen Collection (cultures, biopsies, blood, body fluids, etc.) []  - 0 Specimen(s) / Culture(s) sent or taken to Lab for analysis []  - 0 Patient Transfer (multiple staff / Civil Service fast streamer / Similar devices) []  - 0 Simple Staple / Suture removal (25 or less) []  - 0 Complex Staple / Suture removal (26 or more) []  - 0 Hypo / Hyperglycemic Management (close monitor of Blood Glucose) []  - 0 Ankle / Brachial Index (ABI) - do not check if billed separately X- 1 5 Vital Signs Cathy Hines, Cathy L. ([id number]) Has the patient been seen at the hospital within the last three years: Yes Total Score: 70 Level Of Care: New/Established - Level 2 Electronic Signature(s) Signed: 08/11/2018 5:31:05 PM By: Gretta Cool, BSN, RN, CWS, Kim RN, BSN Entered By: Gretta Cool, BSN, RN, CWS, Kim on 08/11/2018 12:54:53 Cathy Hines ([id number]) -------------------------------------------------------------------------------- Encounter Discharge Information Details Patient Name: Cathy Hines Date of Service: 08/11/2018 12:30 PM Medical Record Number: [id number] Patient Account Number: [id number] Date of Birth/Sex: [birth year] (72 y.o. F) Treating RN: Cornell Barman Primary Care Dayton Kenley: Emily Filbert Other Clinician: Referring Sharmel Ballantine: Emily Filbert Treating Jamye Balicki/Extender: Tito Dine in Treatment: 4 Encounter Discharge Information Items Discharge Condition: Stable Ambulatory Status: Ambulatory Discharge Destination: Home Transportation: Private Auto Schedule Follow-up Appointment: Yes Clinical Summary of Care: Electronic Signature(s) Signed: 08/11/2018 5:31:05 PM By: Gretta Cool, BSN, RN, CWS, Kim RN, BSN Entered By: Gretta Cool, BSN, RN, CWS, Kim on 08/11/2018 13:01:06 Cathy Hines ([id number]) -------------------------------------------------------------------------------- Lower Extremity Assessment Details Patient Name: Cathy Hines Date of Service: 08/11/2018 12:30 PM Medical Record Number: [id number] Patient Account Number: [id number] Date of Birth/Sex: [birth year] (72 y.o. F) Treating RN: Roger Shelter Primary Care Blayne Frankie: Emily Filbert Other Clinician: Referring Ishana Blades: Emily Filbert Treating Saudia Smyser/Extender: Tito Dine in Treatment: 4 Edema Assessment Assessed: [Left: No] [Right: No] Edema: [Left: Ye] [Right: s] Calf Left: Right: Point of Measurement: 32 cm From Medial Instep 37.5 cm cm Ankle Left: Right: Point of Measurement: 10 cm From Medial Instep 20 cm cm Vascular Assessment Claudication: Claudication Assessment [Left:None] Pulses: Dorsalis Pedis Palpable: [Left:Yes] Posterior Tibial Extremity colors, hair growth, and conditions: Extremity Color: [Left:Hyperpigmented] Hair Growth on Extremity: [Left:No] Temperature of Extremity: [Left:Warm] Capillary Refill: [Left:< 3 seconds] Toe Nail Assessment Left: Right: Thick: No Discolored: No Deformed: No Improper Length and Hygiene: No Electronic Signature(s) Signed: 08/11/2018 4:06:29 PM By: Roger Shelter Entered By: Roger Shelter on 08/11/2018 12:45:24 Cathy Hines  ([id number]) -------------------------------------------------------------------------------- Multi Wound Chart Details Patient Name: Cathy Hines Date of Service: 08/11/2018 12:30 PM Medical Record Number: [id number] Patient Account Number: [id number] Date of Birth/Sex: May 11, 1946 (72 y.o. F) Treating RN: Cornell Barman Primary Care Cythina Mickelsen: Emily Filbert Other Clinician: Referring Danniel Tones: Emily Filbert Treating Zita Ozimek/Extender: Tito Dine in Treatment: 4 Vital Signs Height(in): 59 Pulse(bpm): 71 Weight(lbs): 160 Blood Pressure(mmHg): 157/63 Body Mass Index(BMI): 32 Temperature(F): 98.2 Respiratory Rate 16 (  breaths/min): Photos: [2:No Photos] [N/A:N/A] Wound Location: [2:Left Lower Leg - Anterior] [N/A:N/A] Wounding Event: [2:Trauma] [N/A:N/A] Primary Etiology: [2:Trauma, Other] [N/A:N/A] Comorbid History: [2:Cataracts, Chronic sinus problems/congestion, Lymphedema, Asthma, Sleep Apnea, Angina, Arrhythmia, Hypertension, Type II Diabetes, Osteoarthritis, Neuropathy] [N/A:N/A] Date Acquired: [2:07/09/2018] [N/A:N/A] Weeks of Treatment: [2:4] [N/A:N/A] Wound Status: [2:Open] [N/A:N/A] Measurements L x W x D [2:1x1.3x0.2] [N/A:N/A] (cm) Area (cm) : [2:1.021] [N/A:N/A] Volume (cm) : [2:0.204] [N/A:N/A] % Reduction in Area: [2:97.10%] [N/A:N/A] % Reduction in Volume: [2:94.20%] [N/A:N/A] Classification: [2:Partial Thickness] [N/A:N/A] Exudate Amount: [2:Small] [N/A:N/A] Exudate Type: [2:Serous] [N/A:N/A] Exudate Color: [2:amber] [N/A:N/A] Wound Margin: [2:Flat and Intact] [N/A:N/A] Granulation Amount: [2:Small (1-33%)] [N/A:N/A] Granulation Quality: [2:Red, Pink] [N/A:N/A] Necrotic Amount: [2:Medium (34-66%)] [N/A:N/A] Necrotic Tissue: [2:Eschar] [N/A:N/A] Exposed Structures: [2:Fat Layer (Subcutaneous Tissue) Exposed: Yes Fascia: No Tendon: No Muscle: No Joint: No Bone: No] [N/A:N/A] Epithelialization: [2:Medium (34-66%)] [N/A:N/A] Periwound Skin  Texture: Excoriation: No N/A N/A Induration: No Callus: No Crepitus: No Rash: No Scarring: No Periwound Skin Moisture: Maceration: No N/A N/A Dry/Scaly: No Periwound Skin Color: Atrophie Blanche: No N/A N/A Cyanosis: No Ecchymosis: No Erythema: No Hemosiderin Staining: No Mottled: No Pallor: No Rubor: No Temperature: No Abnormality N/A N/A Tenderness on Palpation: No N/A N/A Wound Preparation: Ulcer Cleansing: N/A N/A Rinsed/Irrigated with Saline Topical Anesthetic Applied: Other: lidocaine 4% Treatment Notes Wound #2 (Left, Anterior Lower Leg) Notes Mepitel One, Hydrofera Blue, Bordered Foam Dressing Electronic Signature(s) Signed: 08/11/2018 6:15:06 PM By: Linton Ham MD Entered By: Linton Ham on 08/11/2018 13:02:43 Cathy Hines ([id number]) -------------------------------------------------------------------------------- [place] Details Patient Name: Cathy Hines Date of Service: 08/11/2018 12:30 PM Medical Record Number: [id number] Patient Account Number: [id number] Date of Birth/Sex: [birth year] (72 y.o. F) Treating RN: Cornell Barman Primary Care Clessie Karras: Emily Filbert Other Clinician: Referring Antawan Mchugh: Emily Filbert Treating Damond Borchers/Extender: Tito Dine in Treatment: 4 Active Inactive ` Abuse / Safety / Falls / Self Care Management Nursing Diagnoses: Knowledge deficit related to: safety; personal, health (wound), emergency Goals: Patient/caregiver will verbalize understanding of skin care regimen Date Initiated: 07/14/2018 Target Resolution Date: 08/14/2018 Goal Status: Active Interventions: Podiatry chair, stretcher in low position and side rails up as needed Notes: ` Orientation to the Wound Care Program Nursing Diagnoses: Knowledge deficit related to the wound healing center program Goals: Patient/caregiver will verbalize understanding of the [facility] Date Initiated:  07/14/2018 Target Resolution Date: 08/14/2018 Goal Status: Active Interventions: Provide education on orientation to the wound center Notes: ` Pain, Acute or Chronic Nursing Diagnoses: Pain, acute or chronic: actual or potential Goals: Patient will verbalize adequate pain control and receive pain control interventions during procedures as needed Date Initiated: 07/14/2018 Target Resolution Date: 08/14/2018 Goal Status: Active Interventions: DONDRA, RHETT. ([id number]) Assess comfort goal upon admission Treatment Activities: Administer pain control measures as ordered : 07/14/2018 Notes: ` Wound/Skin Impairment Nursing Diagnoses: Impaired tissue integrity Goals: Ulcer/skin breakdown will have a volume reduction of 30% by week 4 Date Initiated: 07/14/2018 Target Resolution Date: 08/14/2018 Goal Status: Active Interventions: Provide education on ulcer and skin care Treatment Activities: Referred to DME Kiano Terrien for dressing supplies : 07/14/2018 Notes: Electronic Signature(s) Signed: 08/11/2018 5:31:05 PM By: Gretta Cool, BSN, RN, CWS, Kim RN, BSN Entered By: Gretta Cool, BSN, RN, CWS, Kim on 08/11/2018 12:52:49 Cathy Hines ([id number]) -------------------------------------------------------------------------------- Pain Assessment Details Patient Name: Cathy Hines Date of Service: 08/11/2018 12:30 PM Medical Record Number: [id number] Patient Account Number: [id number] Date of Birth/Sex: October 09, 1946 (72 y.o. F) Treating RN: Cornell Barman Primary Care Davonte Siebenaler:  Emily Filbert Other Clinician: Referring Darick Fetters: Emily Filbert Treating Madesyn Ast/Extender: Tito Dine in Treatment: 4 Active Problems Location of Pain Severity and Description of Pain Patient Has Paino Yes Site Locations Rate the pain. Current Pain Level: 8 Pain Management and Medication Current Pain Management: Electronic Signature(s) Signed: 08/11/2018 1:25:39 PM By: Lorine Bears RCP, RRT,  CHT Signed: 08/11/2018 5:31:05 PM By: Gretta Cool, BSN, RN, CWS, Kim RN, BSN Entered By: Lorine Bears on 08/11/2018 12:37:30 Cathy Hines ([id number]) -------------------------------------------------------------------------------- Patient/Caregiver Education Details Patient Name: Cathy Hines Date of Service: 08/11/2018 12:30 PM Medical Record Number: [id number] Patient Account Number: [id number] Date of Birth/Gender: May 30, 1946 (72 y.o. F) Treating RN: Cornell Barman Primary Care Physician: Emily Filbert Other Clinician: Referring Physician: Emily Filbert Treating Physician/Extender: Tito Dine in Treatment: 4 Education Assessment Education Provided To: Patient Education Topics Provided Wound/Skin Impairment: Handouts: Caring for Your Ulcer, Other: continue wound care as prescribed Methods: Demonstration, Explain/Verbal Responses: State content correctly Electronic Signature(s) Signed: 08/11/2018 5:31:05 PM By: Gretta Cool, BSN, RN, CWS, Kim RN, BSN Entered By: Gretta Cool, BSN, RN, CWS, Kim on 08/11/2018 13:01:40 Cathy Hines ([id number]) -------------------------------------------------------------------------------- Wound Assessment Details Patient Name: Cathy Hines Date of Service: 08/11/2018 12:30 PM Medical Record Number: [id number] Patient Account Number: [id number] Date of Birth/Sex: 11-26-1945 (72 y.o. F) Treating RN: Roger Shelter Primary Care Chaundra Abreu: Emily Filbert Other Clinician: Referring Marice Guidone: Emily Filbert Treating Shayna Eblen/Extender: Tito Dine in Treatment: 4 Wound Status Wound Number: 2 Primary Trauma, Other Etiology: Wound Location: Left Lower Leg - Anterior Wound Open Wounding Event: Trauma Status: Date Acquired: 07/09/2018 Comorbid Cataracts, Chronic sinus problems/congestion, Weeks Of Treatment: 4 History: Lymphedema, Asthma, Sleep Apnea, Angina, Clustered Wound: No Arrhythmia, Hypertension, Type II  Diabetes, Osteoarthritis, Neuropathy Photos Photo Uploaded By: Roger Shelter on 08/11/2018 16:21:23 Wound Measurements Length: (cm) 1 Width: (cm) 1.3 Depth: (cm) 0.2 Area: (cm) 1.021 Volume: (cm) 0.204 % Reduction in Area: 97.1% % Reduction in Volume: 94.2% Epithelialization: Medium (34-66%) Tunneling: No Undermining: No Wound Description Classification: Partial Thickness Wound Margin: Flat and Intact Exudate Amount: Small Exudate Type: Serous Exudate Color: amber Foul Odor After Cleansing: No Slough/Fibrino No Wound Bed Granulation Amount: Small (1-33%) Exposed Structure Granulation Quality: Red, Pink Fascia Exposed: No Necrotic Amount: Medium (34-66%) Fat Layer (Subcutaneous Tissue) Exposed: Yes Necrotic Quality: Eschar Tendon Exposed: No Muscle Exposed: No Joint Exposed: No Bone Exposed: No Cathy Hines, Cathy L. ([id number]) Periwound Skin Texture Texture Color No Abnormalities Noted: No No Abnormalities Noted: No Callus: No Atrophie Blanche: No Crepitus: No Cyanosis: No Excoriation: No Ecchymosis: No Induration: No Erythema: No Rash: No Hemosiderin Staining: No Scarring: No Mottled: No Pallor: No Moisture Rubor: No No Abnormalities Noted: No Dry / Scaly: No Temperature / Pain Maceration: No Temperature: No Abnormality Wound Preparation Ulcer Cleansing: Rinsed/Irrigated with Saline Topical Anesthetic Applied: Other: lidocaine 4%, Treatment Notes Wound #2 (Left, Anterior Lower Leg) Notes Mepitel One, Hydrofera Blue, Bordered Foam Dressing Electronic Signature(s) Signed: 08/11/2018 4:06:29 PM By: Roger Shelter Entered By: Roger Shelter on 08/11/2018 12:44:15 Cathy Hines ([id number]) -------------------------------------------------------------------------------- [place] Details Patient Name: Cathy Hines Date of Service: 08/11/2018 12:30 PM Medical Record Number: [id number] Patient Account Number: [id number] Date of Birth/Sex:  Oct 21, 1946 (72 y.o. F) Treating RN: Cornell Barman Primary Care Rafik Koppel: Emily Filbert Other Clinician: Referring Alverna Fawley: Emily Filbert Treating Jefte Carithers/Extender: Tito Dine in Treatment: 4 Vital Signs Time Taken: 12:35 Temperature (F): 98.2 Height (in): 59 Pulse (bpm): 71 Weight (lbs): 160 Respiratory Rate (breaths/min): 16 Body Mass  Index (BMI): 32.3 Blood Pressure (mmHg): 157/63 Reference Range: 80 - 120 mg / dl Electronic Signature(s) Signed: 08/11/2018 1:25:39 PM By: Lorine Bears RCP, RRT, CHT Entered By: Lorine Bears on 08/11/2018 12:40:37

## 2018-08-18 ENCOUNTER — Encounter: Payer: Medicare HMO | Attending: Internal Medicine | Admitting: Internal Medicine

## 2018-08-18 DIAGNOSIS — E039 Hypothyroidism, unspecified: Secondary | ICD-10-CM | POA: Diagnosis not present

## 2018-08-18 DIAGNOSIS — S81812A Laceration without foreign body, left lower leg, initial encounter: Secondary | ICD-10-CM | POA: Diagnosis not present

## 2018-08-18 DIAGNOSIS — E1140 Type 2 diabetes mellitus with diabetic neuropathy, unspecified: Secondary | ICD-10-CM | POA: Diagnosis not present

## 2018-08-18 DIAGNOSIS — I4891 Unspecified atrial fibrillation: Secondary | ICD-10-CM | POA: Diagnosis not present

## 2018-08-18 DIAGNOSIS — E11622 Type 2 diabetes mellitus with other skin ulcer: Secondary | ICD-10-CM | POA: Insufficient documentation

## 2018-08-18 DIAGNOSIS — J45909 Unspecified asthma, uncomplicated: Secondary | ICD-10-CM | POA: Diagnosis not present

## 2018-08-18 DIAGNOSIS — I10 Essential (primary) hypertension: Secondary | ICD-10-CM | POA: Insufficient documentation

## 2018-08-18 DIAGNOSIS — L97821 Non-pressure chronic ulcer of other part of left lower leg limited to breakdown of skin: Secondary | ICD-10-CM | POA: Insufficient documentation

## 2018-08-18 DIAGNOSIS — S81812D Laceration without foreign body, left lower leg, subsequent encounter: Secondary | ICD-10-CM | POA: Diagnosis not present

## 2018-08-18 DIAGNOSIS — E785 Hyperlipidemia, unspecified: Secondary | ICD-10-CM | POA: Diagnosis not present

## 2018-08-18 DIAGNOSIS — W19XXXD Unspecified fall, subsequent encounter: Secondary | ICD-10-CM | POA: Insufficient documentation

## 2018-08-21 NOTE — Progress Notes (Signed)
BRITTANNI, CARIKER ([id number]) Visit Report for 08/18/2018 Arrival Information Details Patient Name: EPIPHANY, SELTZER Date of Service: 08/18/2018 3:15 PM Medical Record Number: [id number] Patient Account Number: [id number] Date of Birth/Sex: 05-Jul-1946 (72 y.o. F) Treating RN: Cornell Barman Primary Care Anastassia Noack: Emily Filbert Other Clinician: Referring Clifton Kovacic: Emily Filbert Treating Jonathen Rathman/Extender: Tito Dine in Treatment: 5 Visit Information History Since Last Visit Added or deleted any medications: No Patient Arrived: Ambulatory Any new allergies or adverse reactions: No Arrival Time: 15:35 Had a fall or experienced change in No Accompanied By: self activities of daily living that may affect Transfer Assistance: None risk of falls: Patient Identification Verified: Yes Signs or symptoms of abuse/neglect since last visito No Secondary Verification Process Completed: Yes Hospitalized since last visit: No Patient Has Alerts: Yes Implantable device outside of the clinic excluding No Patient Alerts: DMII cellular tissue based products placed in the center since last visit: Has Dressing in Place as Prescribed: Yes Pain Present Now: No Electronic Signature(s) Signed: 08/18/2018 4:32:30 PM By: Lorine Bears RCP, RRT, CHT Entered By: Lorine Bears on 08/18/2018 15:36:54 [name] ([id number]) -------------------------------------------------------------------------------- Clinic Level of Care Assessment Details Patient Name: [name] Date of Service: 08/18/2018 3:15 PM Medical Record Number: [id number] Patient Account Number: [id number] Date of Birth/Sex: 05/05/1946 (72 y.o. F) Treating RN: Cornell Barman Primary Care Martavia Tye: Emily Filbert Other Clinician: Referring Sham Alviar: Emily Filbert Treating Mory Herrman/Extender: Tito Dine in Treatment: 5 Clinic Level of Care Assessment Items TOOL 4 Quantity Score []  - Use  when only an EandM is performed on FOLLOW-UP visit 0 ASSESSMENTS - Nursing Assessment / Reassessment []  - Reassessment of Co-morbidities (includes updates in patient status) 0 X- 1 5 Reassessment of Adherence to Treatment Plan ASSESSMENTS - Wound and Skin Assessment / Reassessment X - Simple Wound Assessment / Reassessment - one wound 1 5 []  - 0 Complex Wound Assessment / Reassessment - multiple wounds []  - 0 Dermatologic / Skin Assessment (not related to wound area) ASSESSMENTS - Focused Assessment []  - Circumferential Edema Measurements - multi extremities 0 []  - 0 Nutritional Assessment / Counseling / Intervention []  - 0 Lower Extremity Assessment (monofilament, tuning fork, pulses) []  - 0 Peripheral Arterial Disease Assessment (using hand held doppler) ASSESSMENTS - Ostomy and/or Continence Assessment and Care []  - Incontinence Assessment and Management 0 []  - 0 Ostomy Care Assessment and Management (repouching, etc.) PROCESS - Coordination of Care X - Simple Patient / Family Education for ongoing care 1 15 []  - 0 Complex (extensive) Patient / Family Education for ongoing care []  - 0 Staff obtains Programmer, systems, Records, Test Results / Process Orders []  - 0 Staff telephones HHA, Nursing Homes / Clarify orders / etc []  - 0 Routine Transfer to another Facility (non-emergent condition) []  - 0 Routine Hospital Admission (non-emergent condition) []  - 0 New Admissions / Biomedical engineer / Ordering NPWT, Apligraf, etc. []  - 0 Emergency Hospital Admission (emergent condition) X- 1 10 Simple Discharge Coordination Parkerson, Tanara L. ([id number]) []  - 0 Complex (extensive) Discharge Coordination PROCESS - Special Needs []  - Pediatric / Minor Patient Management 0 []  - 0 Isolation Patient Management []  - 0 Hearing / Language / Visual special needs []  - 0 Assessment of Community assistance (transportation, D/C planning, etc.) []  - 0 Additional assistance / Altered  mentation []  - 0 Support Surface(s) Assessment (bed, cushion, seat, etc.) INTERVENTIONS - Wound Cleansing / Measurement X - Simple Wound Cleansing - one wound 1 5 []  - 0  Complex Wound Cleansing - multiple wounds X- 1 5 Wound Imaging (photographs - any number of wounds) []  - 0 Wound Tracing (instead of photographs) X- 1 5 Simple Wound Measurement - one wound []  - 0 Complex Wound Measurement - multiple wounds INTERVENTIONS - Wound Dressings X - Small Wound Dressing one or multiple wounds 1 10 []  - 0 Medium Wound Dressing one or multiple wounds []  - 0 Large Wound Dressing one or multiple wounds []  - 0 Application of Medications - topical []  - 0 Application of Medications - injection INTERVENTIONS - Miscellaneous []  - External ear exam 0 []  - 0 Specimen Collection (cultures, biopsies, blood, body fluids, etc.) []  - 0 Specimen(s) / Culture(s) sent or taken to Lab for analysis []  - 0 Patient Transfer (multiple staff / Civil Service fast streamer / Similar devices) []  - 0 Simple Staple / Suture removal (25 or less) []  - 0 Complex Staple / Suture removal (26 or more) []  - 0 Hypo / Hyperglycemic Management (close monitor of Blood Glucose) []  - 0 Ankle / Brachial Index (ABI) - do not check if billed separately X- 1 5 Vital Signs Altier, Elvina L. ([id number]) Has the patient been seen at the hospital within the last three years: Yes Total Score: 65 Level Of Care: New/Established - Level 2 Electronic Signature(s) Signed: 08/19/2018 3:05:38 PM By: Gretta Cool, BSN, RN, CWS, Kim RN, BSN Entered By: Gretta Cool, BSN, RN, CWS, Kim on 08/18/2018 17:26:20 [name] ([id number]) -------------------------------------------------------------------------------- Encounter Discharge Information Details Patient Name: [name] Date of Service: 08/18/2018 3:15 PM Medical Record Number: [id number] Patient Account Number: [id number] Date of Birth/Sex: Jul 30, 1946 (72 y.o. F) Treating RN: Cornell Barman Primary Care Melisha Eggleton: Emily Filbert Other Clinician: Referring Stafford Riviera: Emily Filbert Treating Abijah Roussel/Extender: Tito Dine in Treatment: 5 Encounter Discharge Information Items Discharge Condition: Stable Ambulatory Status: Ambulatory Discharge Destination: Home Transportation: Private Auto Accompanied By: self Schedule Follow-up Appointment: No Clinical Summary of Care: Electronic Signature(s) Signed: 08/18/2018 5:27:23 PM By: Gretta Cool, BSN, RN, CWS, Kim RN, BSN Entered By: Gretta Cool, BSN, RN, CWS, Kim on 08/18/2018 17:27:23 [name] ([id number]) -------------------------------------------------------------------------------- Lower Extremity Assessment Details Patient Name: [name] Date of Service: 08/18/2018 3:15 PM Medical Record Number: [id number] Patient Account Number: [id number] Date of Birth/Sex: 29-Aug-1946 (72 y.o. F) Treating RN: Montey Hora Primary Care Treylen Gibbs: Emily Filbert Other Clinician: Referring Josie Mesa: Emily Filbert Treating Haani Bakula/Extender: Tito Dine in Treatment: 5 Edema Assessment Assessed: [Left: No] [Right: No] [Left: Edema] [Right: :] Calf Left: Right: Point of Measurement: 32 cm From Medial Instep 33.2 cm cm Ankle Left: Right: Point of Measurement: 10 cm From Medial Instep 20.6 cm cm Vascular Assessment Pulses: Dorsalis Pedis Palpable: [Left:Yes] Posterior Tibial Extremity colors, hair growth, and conditions: Extremity Color: [Left:Hyperpigmented] Hair Growth on Extremity: [Left:No] Temperature of Extremity: [Left:Warm] Capillary Refill: [Left:< 3 seconds] Toe Nail Assessment Left: Right: Thick: No Discolored: No Deformed: No Improper Length and Hygiene: No Electronic Signature(s) Signed: 08/18/2018 5:06:12 PM By: Montey Hora Entered By: Montey Hora on 08/18/2018 16:12:36 [name]  ([id number]) -------------------------------------------------------------------------------- Multi Wound Chart Details Patient Name: [name] Date of Service: 08/18/2018 3:15 PM Medical Record Number: [id number] Patient Account Number: [id number] Date of Birth/Sex: 05-Mar-1946 (72 y.o. F) Treating RN: Cornell Barman Primary Care Jefrey Raburn: Emily Filbert Other Clinician: Referring Sheron Robin: Emily Filbert Treating Evgenia Merriman/Extender: Tito Dine in Treatment: 5 Vital Signs Height(in): 59 Pulse(bpm): 60 Weight(lbs): 160 Blood Pressure(mmHg): 163/68 Body Mass Index(BMI): 32 Temperature(F): 98.1 Respiratory Rate 16 (breaths/min):  Photos: [N/A:N/A] Wound Location: Left Lower Leg - Anterior N/A N/A Wounding Event: Trauma N/A N/A Primary Etiology: Trauma, Other N/A N/A Comorbid History: Cataracts, Chronic sinus N/A N/A problems/congestion, Lymphedema, Asthma, Sleep Apnea, Angina, Arrhythmia, Hypertension, Type II Diabetes, Osteoarthritis, Neuropathy Date Acquired: 07/09/2018 N/A N/A Weeks of Treatment: 5 N/A N/A Wound Status: Open N/A N/A Measurements L x W x D 0.4x1.3x0.1 N/A N/A (cm) Area (cm) : 0.408 N/A N/A Volume (cm) : 0.041 N/A N/A % Reduction in Area: 98.80% N/A N/A % Reduction in Volume: 98.80% N/A N/A Classification: Partial Thickness N/A N/A Exudate Amount: Small N/A N/A Exudate Type: Serous N/A N/A Exudate Color: amber N/A N/A Wound Margin: Flat and Intact N/A N/A Granulation Amount: Small (1-33%) N/A N/A Granulation Quality: Red, Pink N/A N/A Necrotic Amount: Medium (34-66%) N/A N/A Necrotic Tissue: Eschar N/A N/A Exposed Structures: N/A N/A BURNETT, LIEBER. ([id number]) Fat Layer (Subcutaneous Tissue) Exposed: Yes Fascia: No Tendon: No Muscle: No Joint: No Bone: No Epithelialization: Medium (34-66%) N/A N/A Periwound Skin Texture: Excoriation: No N/A N/A Induration: No Callus: No Crepitus: No Rash: No Scarring: No Periwound Skin  Moisture: Maceration: No N/A N/A Dry/Scaly: No Periwound Skin Color: Atrophie Blanche: No N/A N/A Cyanosis: No Ecchymosis: No Erythema: No Hemosiderin Staining: No Mottled: No Pallor: No Rubor: No Temperature: No Abnormality N/A N/A Tenderness on Palpation: Yes N/A N/A Wound Preparation: Ulcer Cleansing: N/A N/A Rinsed/Irrigated with Saline Topical Anesthetic Applied: Other: lidocaine 4% Treatment Notes Wound #2 (Left, Anterior Lower Leg) Notes Mepitel One, Hydrofera Blue, Bordered Foam Dressing Electronic Signature(s) Signed: 08/19/2018 5:56:52 AM By: Linton Ham MD Entered By: Linton Ham on 08/19/2018 05:49:48 [name] ([id number]) -------------------------------------------------------------------------------- [place] Details Patient Name: [name] Date of Service: 08/18/2018 3:15 PM Medical Record Number: [id number] Patient Account Number: [id number] Date of Birth/Sex: Jul 27, 1946 (72 y.o. F) Treating RN: Cornell Barman Primary Care Shaquille Murdy: Emily Filbert Other Clinician: Referring Ronie Fleeger: Emily Filbert Treating Lorely Bubb/Extender: Tito Dine in Treatment: 5 Active Inactive ` Abuse / Safety / Falls / Self Care Management Nursing Diagnoses: Knowledge deficit related to: safety; personal, health (wound), emergency Goals: Patient/caregiver will verbalize understanding of skin care regimen Date Initiated: 07/14/2018 Target Resolution Date: 08/14/2018 Goal Status: Active Interventions: Podiatry chair, stretcher in low position and side rails up as needed Notes: ` Orientation to the Wound Care Program Nursing Diagnoses: Knowledge deficit related to the wound healing center program Goals: Patient/caregiver will verbalize understanding of the [place] Date Initiated: 07/14/2018 Target Resolution Date: 08/14/2018 Goal Status: Active Interventions: Provide education on orientation to the wound  center Notes: ` Pain, Acute or Chronic Nursing Diagnoses: Pain, acute or chronic: actual or potential Goals: Patient will verbalize adequate pain control and receive pain control interventions during procedures as needed Date Initiated: 07/14/2018 Target Resolution Date: 08/14/2018 Goal Status: Active Interventions: TIMYA, TRIMMER. ([id number]) Assess comfort goal upon admission Treatment Activities: Administer pain control measures as ordered : 07/14/2018 Notes: ` Wound/Skin Impairment Nursing Diagnoses: Impaired tissue integrity Goals: Ulcer/skin breakdown will have a volume reduction of 30% by week 4 Date Initiated: 07/14/2018 Target Resolution Date: 08/14/2018 Goal Status: Active Interventions: Provide education on ulcer and skin care Treatment Activities: Referred to DME Litsy Epting for dressing supplies : 07/14/2018 Notes: Electronic Signature(s) Signed: 08/19/2018 3:05:38 PM By: Gretta Cool, BSN, RN, CWS, Kim RN, BSN Entered By: Gretta Cool, BSN, RN, CWS, Kim on 08/18/2018 16:29:57 [name] ([id number]) -------------------------------------------------------------------------------- Pain Assessment Details Patient Name: [name] Date of Service: 08/18/2018  3:15 PM Medical Record Number: [id number] Patient Account Number: [id number] Date of Birth/Sex: 1946/04/03 (72 y.o. F) Treating RN: Cornell Barman Primary Care Jolyne Laye: Emily Filbert Other Clinician: Referring Cylah Fannin: Emily Filbert Treating Lashondra Vaquerano/Extender: Tito Dine in Treatment: 5 Active Problems Location of Pain Severity and Description of Pain Patient Has Paino No Site Locations Pain Management and Medication Current Pain Management: Electronic Signature(s) Signed: 08/18/2018 4:32:30 PM By: Lorine Bears RCP, RRT, CHT Signed: 08/19/2018 3:05:38 PM By: Gretta Cool, BSN, RN, CWS, Kim RN, BSN Entered By: Lorine Bears on 08/18/2018 15:37:03 [name]  ([id number]) -------------------------------------------------------------------------------- Patient/Caregiver Education Details Patient Name: [name] Date of Service: 08/18/2018 3:15 PM Medical Record Number: [id number] Patient Account Number: [id number] Date of Birth/Gender: 09-25-1946 (72 y.o. F) Treating RN: Cornell Barman Primary Care Physician: Emily Filbert Other Clinician: Referring Physician: Emily Filbert Treating Physician/Extender: Tito Dine in Treatment: 5 Education Assessment Education Provided To: Patient Education Topics Provided Venous: Handouts: Controlling Swelling with Compression Stockings Methods: Explain/Verbal Responses: State content correctly Wound/Skin Impairment: Handouts: Caring for Your Ulcer Methods: Demonstration, Explain/Verbal Responses: State content correctly Electronic Signature(s) Signed: 08/19/2018 3:05:38 PM By: Gretta Cool, BSN, RN, CWS, Kim RN, BSN Entered By: Gretta Cool, BSN, RN, CWS, Kim on 08/18/2018 17:26:53 [name] ([id number]) -------------------------------------------------------------------------------- Wound Assessment Details Patient Name: [name] Date of Service: 08/18/2018 3:15 PM Medical Record Number: [id number] Patient Account Number: [id number] Date of Birth/Sex: 12-03-1945 (72 y.o. F) Treating RN: Montey Hora Primary Care Jaishon Krisher: Emily Filbert Other Clinician: Referring Joesph Marcy: Emily Filbert Treating Braelin Brosch/Extender: Tito Dine in Treatment: 5 Wound Status Wound Number: 2 Primary Trauma, Other Etiology: Wound Location: Left Lower Leg - Anterior Wound Open Wounding Event: Trauma Status: Date Acquired: 07/09/2018 Comorbid Cataracts, Chronic sinus problems/congestion, Weeks Of Treatment: 5 History: Lymphedema, Asthma, Sleep Apnea, Angina, Clustered Wound: No Arrhythmia, Hypertension, Type II Diabetes, Osteoarthritis, Neuropathy Photos Photo Uploaded By: Montey Hora  on 08/18/2018 17:05:35 Wound Measurements Length: (cm) 0.4 Width: (cm) 1.3 Depth: (cm) 0.1 Area: (cm) 0.408 Volume: (cm) 0.041 % Reduction in Area: 98.8% % Reduction in Volume: 98.8% Epithelialization: Medium (34-66%) Tunneling: No Undermining: No Wound Description Classification: Partial Thickness Wound Margin: Flat and Intact Exudate Amount: Small Exudate Type: Serous Exudate Color: amber Foul Odor After Cleansing: No Slough/Fibrino No Wound Bed Granulation Amount: Small (1-33%) Exposed Structure Granulation Quality: Red, Pink Fascia Exposed: No Necrotic Amount: Medium (34-66%) Fat Layer (Subcutaneous Tissue) Exposed: Yes Necrotic Quality: Eschar Tendon Exposed: No Muscle Exposed: No Joint Exposed: No Bone Exposed: No Demartin, Jasamine L. ([id number]) Periwound Skin Texture Texture Color No Abnormalities Noted: No No Abnormalities Noted: No Callus: No Atrophie Blanche: No Crepitus: No Cyanosis: No Excoriation: No Ecchymosis: No Induration: No Erythema: No Rash: No Hemosiderin Staining: No Scarring: No Mottled: No Pallor: No Moisture Rubor: No No Abnormalities Noted: No Dry / Scaly: No Temperature / Pain Maceration: No Temperature: No Abnormality Tenderness on Palpation: Yes Wound Preparation Ulcer Cleansing: Rinsed/Irrigated with Saline Topical Anesthetic Applied: Other: lidocaine 4%, Treatment Notes Wound #2 (Left, Anterior Lower Leg) Notes Mepitel One, Hydrofera Blue, Bordered Foam Dressing Electronic Signature(s) Signed: 08/18/2018 5:06:12 PM By: Montey Hora Entered By: Montey Hora on 08/18/2018 16:09:58 [name] ([id number]) -------------------------------------------------------------------------------- Vitals Details Patient Name: [name] Date of Service: 08/18/2018 3:15 PM Medical Record Number: [id number] Patient Account Number: [id number] Date of Birth/Sex: January 22, 1946 (72 y.o. F) Treating RN: Cornell Barman Primary  Care Belanna Manring: Emily Filbert Other Clinician: Referring Brogan Martis: Emily Filbert Treating Nezzie Manera/Extender: Linton Ham  G Weeks in Treatment: 5 Vital Signs Time Taken: 15:35 Temperature (F): 98.1 Height (in): 59 Pulse (bpm): 60 Weight (lbs): 160 Respiratory Rate (breaths/min): 16 Body Mass Index (BMI): 32.3 Blood Pressure (mmHg): 163/68 Reference Range: 80 - 120 mg / dl Electronic Signature(s) Signed: 08/18/2018 4:32:30 PM By: Lorine Bears RCP, RRT, CHT Entered By: Lorine Bears on 08/18/2018 15:40:00

## 2018-08-21 NOTE — Progress Notes (Signed)
Cathy Hines, Cathy Hines ([id number]) Visit Report for 08/18/2018 HPI Details Patient Name: Cathy Hines, Cathy Hines Date of Service: 08/18/2018 3:15 PM Medical Record Number: [id number] Patient Account Number: [id number] Date of Birth/Sex: 06-Dec-1945 (72 y.o. F) Treating RN: Cornell Barman Primary Care Provider: Emily Filbert Other Clinician: Referring Provider: Emily Filbert Treating Provider/Extender: Tito Dine in Treatment: 5 History of Present Illness HPI Description: 04/07/18 ADMISSION This is a 72 year old woman with type 2 diabetes and a history of diabetic neuropathy. About a month ago she fell while walking in her yard and suffered a laceration of her right anterior shin over the tibia. She tried to re-adhere the skin flaps. She went to see her primary physician on 03/11/18 was given antibiotic ointment, Telfa and Coban and was prescribed doxycycline. I believe she had an x-ray ordered that did not show up particular abnormality. On 03/15/18 Keflex was added. Most recently she has been putting Betadine on this and Vaseline and was told to soak this in Epson salts. She does not have a wound history. She has type 2 diabetes with neuropathy, hyperlipidemia hypertension hypothyroidism mitral valve prolapse and atrial fibrillation, osteoarthritis and asthma. We could not test her ABIs on either side because of complaints about pain, even the left side doesn't have a wound 04/14/18; she comes in today telling us that the debridement I did last week caused her to vomit when she got home. We are using Iodoflex to the wound as the primary dressing under Kerlix and Coban. She also had complaints about the wrap the heat etc. 04/21/18; using Iodoflex to this traumatic area on the right anterior shin. Arise with a better looking surface although still a difficult amount of tightly adherent fibrinous debris. 04/28/18; were using to flex to the traumatic area on her right anterior shin. The wound is contracted.  She does not want compression on today. Still some adherent fibrinous debris over the surface of this. 05/05/18; Hydrofera Blue to the traumatic wound on the right anterior shin. The wound is contracting. 05/12/18; been using Hydrofera Blue to the traumatic wound on the right anterior shin this is totally closed today. READMISSION 07/14/18 Patient is here today with a one-week history of traumatic wounds on the left anterior shin area. This occurred while she was lifting sheet metal out of the trunk of her car with a part of it falling on her left anterior ankle. She has 4 open areas all and close juxtaposition. The top 3 are minor. She has a laceration/skin tear inferiorly in the wound area. This is horizontal some surface debris. She is complaining of a lot of pain although none in these wounds look like they should be that painful. She was seen in urgent care after the incident was not given any antibiotics. She was not x-rayed. She has been applying Silvadene that she got from a friend and feels that helped with the pain. she is previously here with a traumatic wound on her right anterior tibial area just 2 months ago. The left leg was too painful to do ABIs on today although it was noncompressible when she was here the last time. Today the right leg is noncompressible 07/21/18; the patient took her left leg wrap off 2 or 3 days ago. Since then she's been using Neosporin. This was only a Kerlix Coban wrap and should not have been causing her that much trouble. She arrives today with nonviable surface eschar requiring debridement 08/04/18; 2 week follow-up. The patient has been using [facility]  under border foam. She has a listed complaints however her wound is way down in dimension. She did not tolerate any form of compression but she doesn't have a lot of edema [name], [name] ([id number]) 08/11/18; one-week follow-up. The patient is doing well using Hydrofera Blue under border foam. Wound  is contracting she has no complaints. 08/18/18; one-week follow-up. The patient is doing well with Hydrofera Blue under border foam. She states she dropped something on the wound site this week. There doesn't seem to be any adverse effects Electronic Signature(s) Signed: 08/19/2018 5:56:52 AM By: Linton Ham MD Entered By: Linton Ham on 08/18/2018 18:15:58 Cathy Hines ([id number]) -------------------------------------------------------------------------------- Physical Exam Details Patient Name: Cathy Hines Date of Service: 08/18/2018 3:15 PM Medical Record Number: [id number] Patient Account Number: [id number] Date of Birth/Sex: 15-Oct-1946 (72 y.o. F) Treating RN: Cornell Barman Primary Care Provider: Emily Filbert Other Clinician: Referring Provider: Emily Filbert Treating Provider/Extender: Tito Dine in Treatment: 5 Constitutional Patient is hypertensive.. Pulse regular and within target range for patient.Marland Kitchen Respirations regular, non-labored and within target range.. Temperature is normal and within the target range for the patient.Marland Kitchen appears in no distress. Notes wound exam. wound continues to gradually contract. She has not edema however likely some degrees of venous insufficiency. and solar skin damage. She will not use stockings. Slight bruising above the wound from trauma this week however I don't think this has compromised healing Electronic Signature(s) Signed: 08/19/2018 5:56:52 AM By: Linton Ham MD Entered By: Linton Ham on 08/19/2018 05:53:05 Cathy Hines ([id number]) -------------------------------------------------------------------------------- Physician Orders Details Patient Name: Cathy Hines Date of Service: 08/18/2018 3:15 PM Medical Record Number: [id number] Patient Account Number: [id number] Date of Birth/Sex: Dec 14, 1945 (72 y.o. F) Treating RN: Cornell Barman Primary Care Provider: Emily Filbert Other Clinician: Referring  Provider: Emily Filbert Treating Provider/Extender: Tito Dine in Treatment: 5 Verbal / Phone Orders: No Diagnosis Coding Wound Cleansing Wound #2 Left,Anterior Lower Leg o Clean wound with Normal Saline. Anesthetic (add to Medication List) Wound #2 Left,Anterior Lower Leg o Topical Lidocaine 4% cream applied to wound bed prior to debridement (In Clinic Only). Primary Wound Dressing Wound #2 Left,Anterior Lower Leg o Hydrafera Blue Ready Transfer - Mepitel One under Secondary Dressing Wound #2 Left,Anterior Lower Leg o Other - telfa island Dressing Change Frequency Wound #2 Left,Anterior Lower Leg o Change Dressing Monday, Wednesday, Friday Follow-up Appointments Wound #2 Left,Anterior Lower Leg o Return Appointment in 1 week. Electronic Signature(s) Signed: 08/19/2018 5:56:52 AM By: Linton Ham MD Signed: 08/19/2018 3:05:38 PM By: Gretta Cool, BSN, RN, CWS, Kim RN, BSN Entered By: Gretta Cool, BSN, RN, CWS, Kim on 08/18/2018 16:30:27 Cathy Hines, Cathy Hines ([id number]) -------------------------------------------------------------------------------- Problem List Details Patient Name: Cathy Hines Date of Service: 08/18/2018 3:15 PM Medical Record Number: [id number] Patient Account Number: [id number] Date of Birth/Sex: 09-12-1946 (72 y.o. F) Treating RN: Cornell Barman Primary Care Provider: Emily Filbert Other Clinician: Referring Provider: Emily Filbert Treating Provider/Extender: Tito Dine in Treatment: 5 Active Problems ICD-10 Evaluated Encounter Code Description Active Date Today Diagnosis S81.812D Laceration without foreign body, left lower leg, subsequent 07/14/2018 No Yes encounter E11.622 Type 2 diabetes mellitus with other skin ulcer 07/14/2018 No Yes L97.821 Non-pressure chronic ulcer of other part of left lower leg 07/14/2018 No Yes limited to breakdown of skin Inactive Problems Resolved Problems Electronic Signature(s) Signed: 08/19/2018  5:56:52 AM By: Linton Ham MD Entered By: Linton Ham on 08/19/2018 05:49:43 Boivin, Oletta Lamas ([id number]) -------------------------------------------------------------------------------- Progress Note Details Patient Name: Cathy Hines,  Cathy L. Date of Service: 08/18/2018 3:15 PM Medical Record Number: [id number] Patient Account Number: [id number] Date of Birth/Sex: 15-Oct-1946 (72 y.o. F) Treating RN: Cornell Barman Primary Care Provider: Emily Filbert Other Clinician: Referring Provider: Emily Filbert Treating Provider/Extender: Tito Dine in Treatment: 5 Subjective History of Present Illness (HPI) 04/07/18 ADMISSION This is a 72 year old woman with type 2 diabetes and a history of diabetic neuropathy. About a month ago she fell while walking in her yard and suffered a laceration of her right anterior shin over the tibia. She tried to re-adhere the skin flaps. She went to see her primary physician on 03/11/18 was given antibiotic ointment, Telfa and Coban and was prescribed doxycycline. I believe she had an x-ray ordered that did not show up particular abnormality. On 03/15/18 Keflex was added. Most recently she has been putting Betadine on this and Vaseline and was told to soak this in Epson salts. She does not have a wound history. She has type 2 diabetes with neuropathy, hyperlipidemia hypertension hypothyroidism mitral valve prolapse and atrial fibrillation, osteoarthritis and asthma. We could not test her ABIs on either side because of complaints about pain, even the left side doesn't have a wound 04/14/18; she comes in today telling us that the debridement I did last week caused her to vomit when she got home. We are using Iodoflex to the wound as the primary dressing under Kerlix and Coban. She also had complaints about the wrap the heat etc. 04/21/18; using Iodoflex to this traumatic area on the right anterior shin. Arise with a better looking surface although still  a difficult amount of tightly adherent fibrinous debris. 04/28/18; were using to flex to the traumatic area on her right anterior shin. The wound is contracted. She does not want compression on today. Still some adherent fibrinous debris over the surface of this. 05/05/18; Hydrofera Blue to the traumatic wound on the right anterior shin. The wound is contracting. 05/12/18; been using Hydrofera Blue to the traumatic wound on the right anterior shin this is totally closed today. READMISSION 07/14/18 Patient is here today with a one-week history of traumatic wounds on the left anterior shin area. This occurred while she was lifting sheet metal out of the trunk of her car with a part of it falling on her left anterior ankle. She has 4 open areas all and close juxtaposition. The top 3 are minor. She has a laceration/skin tear inferiorly in the wound area. This is horizontal some surface debris. She is complaining of a lot of pain although none in these wounds look like they should be that painful. She was seen in urgent care after the incident was not given any antibiotics. She was not x-rayed. She has been applying Silvadene that she got from a friend and feels that helped with the pain. she is previously here with a traumatic wound on her right anterior tibial area just 2 months ago. The left leg was too painful to do ABIs on today although it was noncompressible when she was here the last time. Today the right leg is noncompressible 07/21/18; the patient took her left leg wrap off 2 or 3 days ago. Since then she's been using Neosporin. This was only a Kerlix Coban wrap and should not have been causing her that much trouble. She arrives today with nonviable surface eschar requiring debridement 08/04/18; 2 week follow-up. The patient has been using Hydrofera Blue under border foam. She has a listed complaints however her wound is way  down in dimension. She did not tolerate any form of compression but she  doesn't have a lot of edema 08/11/18; one-week follow-up. The patient is doing well using Hydrofera Blue under border foam. Wound is contracting she has no complaints. Cathy Hines, Cathy Hines ([id number]) 08/18/18; one-week follow-up. The patient is doing well with Hydrofera Blue under border foam. She states she dropped something on the wound site this week. There doesn't seem to be any adverse effects Objective Constitutional Patient is hypertensive.. Pulse regular and within target range for patient.Marland Kitchen Respirations regular, non-labored and within target range.. Temperature is normal and within the target range for the patient.Marland Kitchen appears in no distress. Vitals Time Taken: 3:35 PM, Height: 59 in, Weight: 160 lbs, BMI: 32.3, Temperature: 98.1 F, Pulse: 60 bpm, Respiratory Rate: 16 breaths/min, Blood Pressure: 163/68 mmHg. General Notes: wound exam. wound continues to gradually contract. She has not edema however likely some degrees of venous insufficiency. and solar skin damage. She will not use stockings. Slight bruising above the wound from trauma this week however I don't think this has compromised healing Integumentary (Hair, Skin) Wound #2 status is Open. Original cause of wound was Trauma. The wound is located on the Left,Anterior Lower Leg. The wound measures 0.4cm length x 1.3cm width x 0.1cm depth; 0.408cm^2 area and 0.041cm^3 volume. There is Fat Layer (Subcutaneous Tissue) Exposed exposed. There is no tunneling or undermining noted. There is a small amount of serous drainage noted. The wound margin is flat and intact. There is small (1-33%) red, pink granulation within the wound bed. There is a medium (34-66%) amount of necrotic tissue within the wound bed including Eschar. The periwound skin appearance did not exhibit: Callus, Crepitus, Excoriation, Induration, Rash, Scarring, Dry/Scaly, Maceration, Atrophie Blanche, Cyanosis, Ecchymosis, Hemosiderin Staining, Mottled, Pallor, Rubor,  Erythema. Periwound temperature was noted as No Abnormality. The periwound has tenderness on palpation. Assessment Active Problems ICD-10 Laceration without foreign body, left lower leg, subsequent encounter Type 2 diabetes mellitus with other skin ulcer Non-pressure chronic ulcer of other part of left lower leg limited to breakdown of skin Plan Wound Cleansing: Wound #2 Left,Anterior Lower Leg: Clean wound with Normal Saline. Cathy Hines, Cathy Hines ([id number]) Anesthetic (add to Medication List): Wound #2 Left,Anterior Lower Leg: Topical Lidocaine 4% cream applied to wound bed prior to debridement (In Clinic Only). Primary Wound Dressing: Wound #2 Left,Anterior Lower Leg: Hydrafera Blue Ready Transfer - Mepitel One under Secondary Dressing: Wound #2 Left,Anterior Lower Leg: Other - telfa island Dressing Change Frequency: Wound #2 Left,Anterior Lower Leg: Change Dressing Monday, Wednesday, Friday Follow-up Appointments: Wound #2 Left,Anterior Lower Leg: Return Appointment in 1 week. 1 hydrofera blue telfa. close to closed 2 There will not be any secondary prevention going forward which is unfortuate. She will not agree to compression stockings and will likely have difficulty with wounds going forward Electronic Signature(s) Signed: 08/19/2018 5:56:52 AM By: Linton Ham MD Entered By: Linton Ham on 08/19/2018 05:55:02 Cathy Hines ([id number]) -------------------------------------------------------------------------------- [place] Details Patient Name: Cathy Hines Date of Service: 08/18/2018 Medical Record Number: [id number] Patient Account Number: [id number] Date of Birth/Sex: Aug 29, 1946 (72 y.o. F) Treating RN: Cornell Barman Primary Care Provider: Emily Filbert Other Clinician: Referring Provider: Emily Filbert Treating Provider/Extender: Tito Dine in Treatment: 5 Diagnosis Coding ICD-10 Codes Code Description [phone] Laceration without foreign  body, left lower leg, subsequent encounter E11.622 Type 2 diabetes mellitus with other skin ulcer L97.821 Non-pressure chronic ulcer of other part of left lower leg limited to breakdown of skin  Facility Procedures CPT4 Code: [id number] Description: [phone] - WOUND CARE VISIT-LEV 2 EST PT Modifier: Quantity: 1 Physician Procedures CPT4 Code Description: [id number] - WC PHYS LEVEL 2 - EST PT ICD-10 Diagnosis Description L97.821 Non-pressure chronic ulcer of other part of left lower leg limi S81.812D Laceration without foreign body, left lower leg, subsequent enc E11.622 Type 2  diabetes mellitus with other skin ulcer Modifier: ted to breakdown ounter Quantity: 1 of skin Electronic Signature(s) Signed: 08/19/2018 5:56:52 AM By: Linton Ham MD Entered By: Linton Ham on 08/19/2018 05:55:31

## 2018-08-25 ENCOUNTER — Encounter: Payer: Medicare HMO | Admitting: Internal Medicine

## 2018-08-25 DIAGNOSIS — S81812D Laceration without foreign body, left lower leg, subsequent encounter: Secondary | ICD-10-CM | POA: Diagnosis not present

## 2018-08-25 DIAGNOSIS — S81812A Laceration without foreign body, left lower leg, initial encounter: Secondary | ICD-10-CM | POA: Diagnosis not present

## 2018-08-25 DIAGNOSIS — E785 Hyperlipidemia, unspecified: Secondary | ICD-10-CM | POA: Diagnosis not present

## 2018-08-25 DIAGNOSIS — J45909 Unspecified asthma, uncomplicated: Secondary | ICD-10-CM | POA: Diagnosis not present

## 2018-08-25 DIAGNOSIS — L97821 Non-pressure chronic ulcer of other part of left lower leg limited to breakdown of skin: Secondary | ICD-10-CM | POA: Diagnosis not present

## 2018-08-25 DIAGNOSIS — I10 Essential (primary) hypertension: Secondary | ICD-10-CM | POA: Diagnosis not present

## 2018-08-25 DIAGNOSIS — E11622 Type 2 diabetes mellitus with other skin ulcer: Secondary | ICD-10-CM | POA: Diagnosis not present

## 2018-08-25 DIAGNOSIS — I872 Venous insufficiency (chronic) (peripheral): Secondary | ICD-10-CM | POA: Diagnosis not present

## 2018-08-25 DIAGNOSIS — I4891 Unspecified atrial fibrillation: Secondary | ICD-10-CM | POA: Diagnosis not present

## 2018-08-25 DIAGNOSIS — E1140 Type 2 diabetes mellitus with diabetic neuropathy, unspecified: Secondary | ICD-10-CM | POA: Diagnosis not present

## 2018-08-25 DIAGNOSIS — E039 Hypothyroidism, unspecified: Secondary | ICD-10-CM | POA: Diagnosis not present

## 2018-08-28 NOTE — Progress Notes (Signed)
RHETTA, CLEEK ([id number]) Visit Report for 08/25/2018 HPI Details Patient Name: Cathy Hines, Cathy Hines Date of Service: 08/25/2018 10:45 AM Medical Record Number: [id number] Patient Account Number: [id number] Date of Birth/Sex: 09-11-1946 (72 y.o. F) Treating RN: Cornell Barman Primary Care Provider: Emily Filbert Other Clinician: Referring Provider: Emily Filbert Treating Provider/Extender: Tito Dine in Treatment: 6 History of Present Illness HPI Description: 04/07/18 ADMISSION This is a 72 year old woman with type 2 diabetes and a history of diabetic neuropathy. About a month ago she fell while walking in her yard and suffered a laceration of her right anterior shin over the tibia. She tried to re-adhere the skin flaps. She went to see her primary physician on 03/11/18 was given antibiotic ointment, Telfa and Coban and was prescribed doxycycline. I believe she had an x-ray ordered that did not show up particular abnormality. On 03/15/18 Keflex was added. Most recently she has been putting Betadine on this and Vaseline and was told to soak this in Epson salts. She does not have a wound history. She has type 2 diabetes with neuropathy, hyperlipidemia hypertension hypothyroidism mitral valve prolapse and atrial fibrillation, osteoarthritis and asthma. We could not test her ABIs on either side because of complaints about pain, even the left side doesn't have a wound 04/14/18; she comes in today telling us that the debridement I did last week caused her to vomit when she got home. We are using Iodoflex to the wound as the primary dressing under Kerlix and Coban. She also had complaints about the wrap the heat etc. 04/21/18; using Iodoflex to this traumatic area on the right anterior shin. Arise with a better looking surface although still a difficult amount of tightly adherent fibrinous debris. 04/28/18; were using to flex to the traumatic area on her right anterior shin. The wound is contracted.  She does not want compression on today. Still some adherent fibrinous debris over the surface of this. 05/05/18; Hydrofera Blue to the traumatic wound on the right anterior shin. The wound is contracting. 05/12/18; been using Hydrofera Blue to the traumatic wound on the right anterior shin this is totally closed today. READMISSION 07/14/18 Patient is here today with a one-week history of traumatic wounds on the left anterior shin area. This occurred while she was lifting sheet metal out of the trunk of her car with a part of it falling on her left anterior ankle. She has 4 open areas all and close juxtaposition. The top 3 are minor. She has a laceration/skin tear inferiorly in the wound area. This is horizontal some surface debris. She is complaining of a lot of pain although none in these wounds look like they should be that painful. She was seen in urgent care after the incident was not given any antibiotics. She was not x-rayed. She has been applying Silvadene that she got from a friend and feels that helped with the pain. she is previously here with a traumatic wound on her right anterior tibial area just 2 months ago. The left leg was too painful to do ABIs on today although it was noncompressible when she was here the last time. Today the right leg is noncompressible 07/21/18; the patient took her left leg wrap off 2 or 3 days ago. Since then she's been using Neosporin. This was only a Kerlix Coban wrap and should not have been causing her that much trouble. She arrives today with nonviable surface eschar requiring debridement 08/04/18; 2 week follow-up. The patient has been using [facility]  under border foam. She has a listed complaints however her wound is way down in dimension. She did not tolerate any form of compression but she doesn't have a lot of Cathy Hines, Cathy L. ([id number]) edema 08/11/18; one-week follow-up. The patient is doing well using Hydrofera Blue under border foam. Wound  is contracting she has no complaints. 08/18/18; one-week follow-up. The patient is doing well with Hydrofera Blue under border foam. She states she dropped something on the wound site this week. There doesn't seem to be any adverse effects 08/25/18; Hydrofera Blue under border foam. The vast majority of this wound is closed. Only a small open area remains on the medial part of the horizontal wound Electronic Signature(s) Signed: 08/25/2018 6:14:59 PM By: Linton Ham MD Entered By: Linton Ham on 08/25/2018 12:41:31 Cathy Hines ([id number]) -------------------------------------------------------------------------------- Physical Exam Details Patient Name: Cathy Hines Date of Service: 08/25/2018 10:45 AM Medical Record Number: [id number] Patient Account Number: [id number] Date of Birth/Sex: October 12, 1946 (72 y.o. F) Treating RN: Cornell Barman Primary Care Provider: Emily Filbert Other Clinician: Referring Provider: Emily Filbert Treating Provider/Extender: Tito Dine in Treatment: 6 Constitutional Patient is hypertensive.. Pulse regular and within target range for patient.Marland Kitchen Respirations regular, non-labored and within target range.. Temperature is normal and within the target range for the patient.. No change in height.Marland Kitchen Respiratory Respiratory effort is easy and symmetric bilaterally. Rate is normal at rest and on room air.. Cardiovascular Pedal pulses palpable and strong bilaterally.. There is no edema. Psychiatric No evidence of depression, anxiety, or agitation. Calm, cooperative, and communicative. Appropriate interactions and affect.. Notes Wound exam; wound continues to gradually contract. She only has a very tiny open area medially. She has no uncontrolled edema. Peripheral pulses are palpable no evidence of infection Electronic Signature(s) Signed: 08/25/2018 6:14:59 PM By: Linton Ham MD Entered By: Linton Ham on 08/25/2018 12:43:07 Cathy Hines ([id number]) -------------------------------------------------------------------------------- Physician Orders Details Patient Name: Cathy Hines Date of Service: 08/25/2018 10:45 AM Medical Record Number: [id number] Patient Account Number: [id number] Date of Birth/Sex: August 04, 1946 (72 y.o. F) Treating RN: Cornell Barman Primary Care Provider: Emily Filbert Other Clinician: Referring Provider: Emily Filbert Treating Provider/Extender: Tito Dine in Treatment: 6 Verbal / Phone Orders: No Diagnosis Coding Wound Cleansing Wound #2 Left,Anterior Lower Leg o Clean wound with Normal Saline. Anesthetic (add to Medication List) Wound #2 Left,Anterior Lower Leg o Topical Lidocaine 4% cream applied to wound bed prior to debridement (In Clinic Only). Primary Wound Dressing Wound #2 Left,Anterior Lower Leg o Hydrafera Blue Ready Transfer - Mepitel One under Secondary Dressing Wound #2 Left,Anterior Lower Leg o Other - telfa island Dressing Change Frequency Wound #2 Left,Anterior Lower Leg o Change Dressing Monday, Wednesday, Friday Follow-up Appointments Wound #2 Left,Anterior Lower Leg o Return Appointment in 1 week. Electronic Signature(s) Signed: 08/25/2018 5:34:51 PM By: Gretta Cool, BSN, RN, CWS, Kim RN, BSN Signed: 08/25/2018 6:14:59 PM By: Linton Ham MD Entered By: Gretta Cool, BSN, RN, CWS, Kim on 08/25/2018 11:06:35 Cathy Hines, Cathy Hines ([id number]) -------------------------------------------------------------------------------- Problem List Details Patient Name: Cathy Hines Date of Service: 08/25/2018 10:45 AM Medical Record Number: [id number] Patient Account Number: [id number] Date of Birth/Sex: [birth year] (72 y.o. F) Treating RN: Cornell Barman Primary Care Provider: Emily Filbert Other Clinician: Referring Provider: Emily Filbert Treating Provider/Extender: Tito Dine in Treatment: 6 Active Problems ICD-10 Evaluated Encounter Code Description  Active Date Today Diagnosis S81.812D Laceration without foreign body, left lower leg, subsequent 07/14/2018 No Yes encounter E11.622 Type 2 diabetes mellitus with  other skin ulcer 07/14/2018 No Yes L97.821 Non-pressure chronic ulcer of other part of left lower leg 07/14/2018 No Yes limited to breakdown of skin Inactive Problems Resolved Problems Electronic Signature(s) Signed: 08/25/2018 6:14:59 PM By: Linton Ham MD Entered By: Linton Ham on 08/25/2018 12:39:47 Cathy Hines ([id number]) -------------------------------------------------------------------------------- Progress Note Details Patient Name: Cathy Hines Date of Service: 08/25/2018 10:45 AM Medical Record Number: [id number] Patient Account Number: [id number] Date of Birth/Sex: 11/23/1945 (72 y.o. F) Treating RN: Cornell Barman Primary Care Provider: Emily Filbert Other Clinician: Referring Provider: Emily Filbert Treating Provider/Extender: Tito Dine in Treatment: 6 Subjective History of Present Illness (HPI) 04/07/18 ADMISSION This is a 72 year old woman with type 2 diabetes and a history of diabetic neuropathy. About a month ago she fell while walking in her yard and suffered a laceration of her right anterior shin over the tibia. She tried to re-adhere the skin flaps. She went to see her primary physician on 03/11/18 was given antibiotic ointment, Telfa and Coban and was prescribed doxycycline. I believe she had an x-ray ordered that did not show up particular abnormality. On 03/15/18 Keflex was added. Most recently she has been putting Betadine on this and Vaseline and was told to soak this in Epson salts. She does not have a wound history. She has type 2 diabetes with neuropathy, hyperlipidemia hypertension hypothyroidism mitral valve prolapse and atrial fibrillation, osteoarthritis and asthma. We could not test her ABIs on either side because of complaints about pain, even the left side doesn't have  a wound 04/14/18; she comes in today telling us that the debridement I did last week caused her to vomit when she got home. We are using Iodoflex to the wound as the primary dressing under Kerlix and Coban. She also had complaints about the wrap the heat etc. 04/21/18; using Iodoflex to this traumatic area on the right anterior shin. Arise with a better looking surface although still a difficult amount of tightly adherent fibrinous debris. 04/28/18; were using to flex to the traumatic area on her right anterior shin. The wound is contracted. She does not want compression on today. Still some adherent fibrinous debris over the surface of this. 05/05/18; Hydrofera Blue to the traumatic wound on the right anterior shin. The wound is contracting. 05/12/18; been using Hydrofera Blue to the traumatic wound on the right anterior shin this is totally closed today. READMISSION 07/14/18 Patient is here today with a one-week history of traumatic wounds on the left anterior shin area. This occurred while she was lifting sheet metal out of the trunk of her car with a part of it falling on her left anterior ankle. She has 4 open areas all and close juxtaposition. The top 3 are minor. She has a laceration/skin tear inferiorly in the wound area. This is horizontal some surface debris. She is complaining of a lot of pain although none in these wounds look like they should be that painful. She was seen in urgent care after the incident was not given any antibiotics. She was not x-rayed. She has been applying Silvadene that she got from a friend and feels that helped with the pain. she is previously here with a traumatic wound on her right anterior tibial area just 2 months ago. The left leg was too painful to do ABIs on today although it was noncompressible when she was here the last time. Today the right leg is noncompressible 07/21/18; the patient took her left leg wrap off 2 or 3 days  ago. Since then she's been using  Neosporin. This was only a Kerlix Coban wrap and should not have been causing her that much trouble. She arrives today with nonviable surface eschar requiring debridement 08/04/18; 2 week follow-up. The patient has been using Hydrofera Blue under border foam. She has a listed complaints however her wound is way down in dimension. She did not tolerate any form of compression but she doesn't have a lot of edema 08/11/18; one-week follow-up. The patient is doing well using Hydrofera Blue under border foam. Wound is contracting she has Cathy Hines, Cathy L. ([id number]) no complaints. 08/18/18; one-week follow-up. The patient is doing well with Hydrofera Blue under border foam. She states she dropped something on the wound site this week. There doesn't seem to be any adverse effects 08/25/18; Hydrofera Blue under border foam. The vast majority of this wound is closed. Only a small open area remains on the medial part of the horizontal wound Objective Constitutional Patient is hypertensive.. Pulse regular and within target range for patient.Marland Kitchen Respirations regular, non-labored and within target range.. Temperature is normal and within the target range for the patient.. No change in height.. Vitals Time Taken: 10:48 AM, Height: 59 in, Weight: 160 lbs, BMI: 32.3, Temperature: 98 F, Pulse: 61 bpm, Respiratory Rate: 16 breaths/min, Blood Pressure: 173/63 mmHg. Respiratory Respiratory effort is easy and symmetric bilaterally. Rate is normal at rest and on room air.. Cardiovascular Pedal pulses palpable and strong bilaterally.. There is no edema. Psychiatric No evidence of depression, anxiety, or agitation. Calm, cooperative, and communicative. Appropriate interactions and affect.. General Notes: Wound exam; wound continues to gradually contract. She only has a very tiny open area medially. She has no uncontrolled edema. Peripheral pulses are palpable no evidence of infection Integumentary (Hair,  Skin) Wound #2 status is Open. Original cause of wound was Trauma. The wound is located on the Left,Anterior Lower Leg. The wound measures 0.2cm length x 0.8cm width x 0.1cm depth; 0.126cm^2 area and 0.013cm^3 volume. There is Fat Layer (Subcutaneous Tissue) Exposed exposed. There is no tunneling or undermining noted. There is a small amount of serous drainage noted. The wound margin is flat and intact. There is small (1-33%) red, pink granulation within the wound bed. There is a medium (34-66%) amount of necrotic tissue within the wound bed including Eschar. The periwound skin appearance did not exhibit: Callus, Crepitus, Excoriation, Induration, Rash, Scarring, Dry/Scaly, Maceration, Atrophie Blanche, Cyanosis, Ecchymosis, Hemosiderin Staining, Mottled, Pallor, Rubor, Erythema. Periwound temperature was noted as No Abnormality. The periwound has tenderness on palpation. Assessment Active Problems ICD-10 Laceration without foreign body, left lower leg, subsequent encounter Type 2 diabetes mellitus with other skin ulcer Non-pressure chronic ulcer of other part of left lower leg limited to breakdown of skin Cathy Hines, Cathy L. ([id number]) Plan Wound Cleansing: Wound #2 Left,Anterior Lower Leg: Clean wound with Normal Saline. Anesthetic (add to Medication List): Wound #2 Left,Anterior Lower Leg: Topical Lidocaine 4% cream applied to wound bed prior to debridement (In Clinic Only). Primary Wound Dressing: Wound #2 Left,Anterior Lower Leg: Hydrafera Blue Ready Transfer - Mepitel One under Secondary Dressing: Wound #2 Left,Anterior Lower Leg: Other - telfa island Dressing Change Frequency: Wound #2 Left,Anterior Lower Leg: Change Dressing Monday, Wednesday, Friday Follow-up Appointments: Wound #2 Left,Anterior Lower Leg: Return Appointment in 1 week. #1 we are continuing with the Hydrofera Blue #2 I'm hopeful this will be closed by next week #3 the patient has some degree of chronic  venous insufficiency and solar-induced skin injury in the lower  extremities I am doubtful I'll be able to convince her to wear stockings however. If she returns in short order with another laceration type injury TO be a little more forceful on this Electronic Signature(s) Signed: 08/25/2018 6:14:59 PM By: Linton Ham MD Entered By: Linton Ham on 08/25/2018 12:44:31 Cathy Hines ([id number]) -------------------------------------------------------------------------------- SuperBill Details Patient Name: Cathy Hines Date of Service: 08/25/2018 Medical Record Number: [id number] Patient Account Number: [id number] Date of Birth/Sex: 25-May-1946 (72 y.o. F) Treating RN: Cornell Barman Primary Care Provider: Emily Filbert Other Clinician: Referring Provider: Emily Filbert Treating Provider/Extender: Tito Dine in Treatment: 6 Diagnosis Coding ICD-10 Codes Code Description [phone] Laceration without foreign body, left lower leg, subsequent encounter E11.622 Type 2 diabetes mellitus with other skin ulcer L97.821 Non-pressure chronic ulcer of other part of left lower leg limited to breakdown of skin Facility Procedures CPT4 Code: [id number] Description: [phone] - WOUND CARE VISIT-LEV 2 EST PT Modifier: Quantity: 1 Physician Procedures CPT4 Code Description: [id number] [place] - WC PHYS LEVEL 3 - EST PT ICD-10 Diagnosis Description S81.812D Laceration without foreign body, left lower leg, subsequent enc L97.821 Non-pressure chronic ulcer of other part of left lower leg limi Modifier: ounter ted to breakdown Quantity: 1 of skin Electronic Signature(s) Signed: 08/25/2018 6:14:59 PM By: Linton Ham MD Entered By: Linton Ham on 08/25/2018 12:44:57

## 2018-08-28 NOTE — Progress Notes (Signed)
TERRACE, CHIEM ([id number]) Visit Report for 08/25/2018 Arrival Information Details Patient Name: Cathy Hines, Cathy Hines Date of Service: 08/25/2018 10:45 AM Medical Record Number: [id number] Patient Account Number: [id number] Date of Birth/Sex: [birth year] (72 y.o. F) Treating RN: Secundino Ginger Primary Care Janesa Dockery: Emily Filbert Other Clinician: Referring Alonda Weaber: Emily Filbert Treating Zionah Criswell/Extender: Tito Dine in Treatment: 6 Visit Information History Since Last Visit Added or deleted any medications: No Patient Arrived: Ambulatory Any new allergies or adverse reactions: No Arrival Time: 10:44 Had a fall or experienced change in No Accompanied By: self activities of daily living that may affect Transfer Assistance: None risk of falls: Patient Identification Verified: Yes Signs or symptoms of abuse/neglect since last visito No Secondary Verification Process Completed: Yes Hospitalized since last visit: No Patient Has Alerts: Yes Implantable device outside of the clinic excluding No Patient Alerts: DMII cellular tissue based products placed in the center since last visit: Has Dressing in Place as Prescribed: Yes Pain Present Now: No Electronic Signature(s) Signed: 08/25/2018 11:44:54 AM By: Secundino Ginger Entered By: Secundino Ginger on 08/25/2018 10:45:52 Cathy Hines ([id number]) -------------------------------------------------------------------------------- Clinic Level of Care Assessment Details Patient Name: Cathy Hines Date of Service: 08/25/2018 10:45 AM Medical Record Number: [id number] Patient Account Number: [id number] Date of Birth/Sex: June 02, 1946 (72 y.o. F) Treating RN: Cornell Barman Primary Care Meeyah Ovitt: Emily Filbert Other Clinician: Referring Fox Salminen: Emily Filbert Treating Celines Femia/Extender: Tito Dine in Treatment: 6 Clinic Level of Care Assessment Items TOOL 4 Quantity Score []  - Use when only an EandM is performed on FOLLOW-UP visit  0 ASSESSMENTS - Nursing Assessment / Reassessment []  - Reassessment of Co-morbidities (includes updates in patient status) 0 X- 1 5 Reassessment of Adherence to Treatment Plan ASSESSMENTS - Wound and Skin Assessment / Reassessment X - Simple Wound Assessment / Reassessment - one wound 1 5 []  - 0 Complex Wound Assessment / Reassessment - multiple wounds []  - 0 Dermatologic / Skin Assessment (not related to wound area) ASSESSMENTS - Focused Assessment []  - Circumferential Edema Measurements - multi extremities 0 []  - 0 Nutritional Assessment / Counseling / Intervention []  - 0 Lower Extremity Assessment (monofilament, tuning fork, pulses) []  - 0 Peripheral Arterial Disease Assessment (using hand held doppler) ASSESSMENTS - Ostomy and/or Continence Assessment and Care []  - Incontinence Assessment and Management 0 []  - 0 Ostomy Care Assessment and Management (repouching, etc.) PROCESS - Coordination of Care X - Simple Patient / Family Education for ongoing care 1 15 []  - 0 Complex (extensive) Patient / Family Education for ongoing care []  - 0 Staff obtains Programmer, systems, Records, Test Results / Process Orders []  - 0 Staff telephones HHA, Nursing Homes / Clarify orders / etc []  - 0 Routine Transfer to another Facility (non-emergent condition) []  - 0 Routine Hospital Admission (non-emergent condition) []  - 0 New Admissions / Biomedical engineer / Ordering NPWT, Apligraf, etc. []  - 0 Emergency Hospital Admission (emergent condition) X- 1 10 Simple Discharge Coordination Cathy Hines, Cathy L. ([id number]) []  - 0 Complex (extensive) Discharge Coordination PROCESS - Special Needs []  - Pediatric / Minor Patient Management 0 []  - 0 Isolation Patient Management []  - 0 Hearing / Language / Visual special needs []  - 0 Assessment of Community assistance (transportation, D/C planning, etc.) []  - 0 Additional assistance / Altered mentation []  - 0 Support Surface(s) Assessment (bed,  cushion, seat, etc.) INTERVENTIONS - Wound Cleansing / Measurement X - Simple Wound Cleansing - one wound 1 5 []  - 0 Complex Wound Cleansing - multiple  wounds X- 1 5 Wound Imaging (photographs - any number of wounds) []  - 0 Wound Tracing (instead of photographs) X- 1 5 Simple Wound Measurement - one wound []  - 0 Complex Wound Measurement - multiple wounds INTERVENTIONS - Wound Dressings []  - Small Wound Dressing one or multiple wounds 0 X- 1 15 Medium Wound Dressing one or multiple wounds []  - 0 Large Wound Dressing one or multiple wounds []  - 0 Application of Medications - topical []  - 0 Application of Medications - injection INTERVENTIONS - Miscellaneous []  - External ear exam 0 []  - 0 Specimen Collection (cultures, biopsies, blood, body fluids, etc.) []  - 0 Specimen(s) / Culture(s) sent or taken to Lab for analysis []  - 0 Patient Transfer (multiple staff / Civil Service fast streamer / Similar devices) []  - 0 Simple Staple / Suture removal (25 or less) []  - 0 Complex Staple / Suture removal (26 or more) []  - 0 Hypo / Hyperglycemic Management (close monitor of Blood Glucose) []  - 0 Ankle / Brachial Index (ABI) - do not check if billed separately X- 1 5 Vital Signs Cathy Hines, Cathy L. ([id number]) Has the patient been seen at the hospital within the last three years: Yes Total Score: 70 Level Of Care: New/Established - Level 2 Electronic Signature(s) Signed: 08/25/2018 5:34:51 PM By: Gretta Cool, BSN, RN, CWS, Kim RN, BSN Entered By: Gretta Cool, BSN, RN, CWS, Kim on 08/25/2018 11:06:59 Cathy Hines ([id number]) -------------------------------------------------------------------------------- Encounter Discharge Information Details Patient Name: Cathy Hines Date of Service: 08/25/2018 10:45 AM Medical Record Number: [id number] Patient Account Number: [id number] Date of Birth/Sex: 1946-09-17 (72 y.o. F) Treating RN: Cornell Barman Primary Care Luian Schumpert: Emily Filbert Other  Clinician: Referring Verginia Toohey: Emily Filbert Treating Jamilynn Whitacre/Extender: Tito Dine in Treatment: 6 Encounter Discharge Information Items Discharge Condition: Stable Ambulatory Status: Ambulatory Discharge Destination: Home Transportation: Private Auto Accompanied By: self Schedule Follow-up Appointment: Yes Clinical Summary of Care: Electronic Signature(s) Signed: 08/25/2018 5:34:51 PM By: Gretta Cool, BSN, RN, CWS, Kim RN, BSN Entered By: Gretta Cool, BSN, RN, CWS, Kim on 08/25/2018 11:07:54 Cathy Hines ([id number]) -------------------------------------------------------------------------------- Lower Extremity Assessment Details Patient Name: Cathy Hines Date of Service: 08/25/2018 10:45 AM Medical Record Number: [id number] Patient Account Number: [id number] Date of Birth/Sex: 1946/10/05 (72 y.o. F) Treating RN: Secundino Ginger Primary Care Charlye Spare: Emily Filbert Other Clinician: Referring Piedad Standiford: Emily Filbert Treating Inri Sobieski/Extender: Tito Dine in Treatment: 6 Edema Assessment Assessed: [Left: No] [Right: No] Edema: [Left: No] [Right: No] Calf Left: Right: Point of Measurement: 32 cm From Medial Instep 36 cm 36 cm Ankle Left: Right: Point of Measurement: 11 cm From Medial Instep 20.5 cm 19 cm Vascular Assessment Claudication: Claudication Assessment [Left:None] [Right:None] Pulses: Dorsalis Pedis Palpable: [Left:Yes] [Right:Yes] Posterior Tibial Extremity colors, hair growth, and conditions: Extremity Color: [Left:Normal] [Right:Normal] Hair Growth on Extremity: [Left:No] [Right:No] Temperature of Extremity: [Left:Warm] [Right:Warm] Capillary Refill: [Left:< 3 seconds] [Right:< 3 seconds] Toe Nail Assessment Left: Right: Thick: Yes Yes Discolored: Yes Yes Deformed: Yes Yes Improper Length and Hygiene: No No Electronic Signature(s) Signed: 08/25/2018 11:44:54 AM By: Secundino Ginger Entered By: Secundino Ginger on 08/25/2018 10:54:14 Cathy Hines  ([id number]) -------------------------------------------------------------------------------- Multi Wound Chart Details Patient Name: Cathy Hines Date of Service: 08/25/2018 10:45 AM Medical Record Number: [id number] Patient Account Number: [id number] Date of Birth/Sex: 1945-12-21 (72 y.o. F) Treating RN: Cornell Barman Primary Care Siria Calandro: Emily Filbert Other Clinician: Referring Lexii Walsh: Emily Filbert Treating Evanne Matsunaga/Extender: Tito Dine in Treatment: 6 Vital Signs Height(in): 59 Pulse(bpm): 61 Weight(lbs): 160  Blood Pressure(mmHg): 173/63 Body Mass Index(BMI): 32 Temperature(F): 98 Respiratory Rate 16 (breaths/min): Photos: [N/A:N/A] Wound Location: Left Lower Leg - Anterior N/A N/A Wounding Event: Trauma N/A N/A Primary Etiology: Trauma, Other N/A N/A Comorbid History: Cataracts, Chronic sinus N/A N/A problems/congestion, Lymphedema, Asthma, Sleep Apnea, Angina, Arrhythmia, Hypertension, Type II Diabetes, Osteoarthritis, Neuropathy Date Acquired: 07/09/2018 N/A N/A Weeks of Treatment: 6 N/A N/A Wound Status: Open N/A N/A Measurements L x W x D 0.2x0.8x0.1 N/A N/A (cm) Area (cm) : 0.126 N/A N/A Volume (cm) : 0.013 N/A N/A % Reduction in Area: 99.60% N/A N/A % Reduction in Volume: 99.60% N/A N/A Classification: Partial Thickness N/A N/A Exudate Amount: Small N/A N/A Exudate Type: Serous N/A N/A Exudate Color: amber N/A N/A Wound Margin: Flat and Intact N/A N/A Granulation Amount: Small (1-33%) N/A N/A Granulation Quality: Red, Pink N/A N/A Necrotic Amount: Medium (34-66%) N/A N/A Necrotic Tissue: Eschar N/A N/A Exposed Structures: N/A N/A Cathy Hines, Cathy L. ([id number]) Fat Layer (Subcutaneous Tissue) Exposed: Yes Fascia: No Tendon: No Muscle: No Joint: No Bone: No Epithelialization: Medium (34-66%) N/A N/A Periwound Skin Texture: Excoriation: No N/A N/A Induration: No Callus: No Crepitus: No Rash: No Scarring: No Periwound Skin  Moisture: Maceration: No N/A N/A Dry/Scaly: No Periwound Skin Color: Atrophie Blanche: No N/A N/A Cyanosis: No Ecchymosis: No Erythema: No Hemosiderin Staining: No Mottled: No Pallor: No Rubor: No Temperature: No Abnormality N/A N/A Tenderness on Palpation: Yes N/A N/A Wound Preparation: Ulcer Cleansing: N/A N/A Rinsed/Irrigated with Saline Topical Anesthetic Applied: Other: lidocaine 4% Treatment Notes Wound #2 (Left, Anterior Lower Leg) Notes Mepitel One, Hydrofera Blue, Bordered Foam Dressing Electronic Signature(s) Signed: 08/25/2018 6:14:59 PM By: Linton Ham MD Entered By: Linton Ham on 08/25/2018 12:40:39 Cathy Hines ([id number]) -------------------------------------------------------------------------------- [place] Details Patient Name: Cathy Hines Date of Service: 08/25/2018 10:45 AM Medical Record Number: [id number] Patient Account Number: [id number] Date of Birth/Sex: 07/28/1946 (72 y.o. F) Treating RN: Cornell Barman Primary Care Carsen Machi: Emily Filbert Other Clinician: Referring Shadaya Marschner: Emily Filbert Treating Tuwanda Vokes/Extender: Tito Dine in Treatment: 6 Active Inactive ` Abuse / Safety / Falls / Self Care Management Nursing Diagnoses: Knowledge deficit related to: safety; personal, health (wound), emergency Goals: Patient/caregiver will verbalize understanding of skin care regimen Date Initiated: 07/14/2018 Target Resolution Date: 08/14/2018 Goal Status: Active Interventions: Podiatry chair, stretcher in low position and side rails up as needed Notes: ` Orientation to the Wound Care Program Nursing Diagnoses: Knowledge deficit related to the wound healing center program Goals: Patient/caregiver will verbalize understanding of the [place] Date Initiated: 07/14/2018 Target Resolution Date: 08/14/2018 Goal Status: Active Interventions: Provide education on orientation to the wound  center Notes: ` Pain, Acute or Chronic Nursing Diagnoses: Pain, acute or chronic: actual or potential Goals: Patient will verbalize adequate pain control and receive pain control interventions during procedures as needed Date Initiated: 07/14/2018 Target Resolution Date: 08/14/2018 Goal Status: Active Interventions: Cathy Hines, GLASNER. ([id number]) Assess comfort goal upon admission Treatment Activities: Administer pain control measures as ordered : 07/14/2018 Notes: ` Wound/Skin Impairment Nursing Diagnoses: Impaired tissue integrity Goals: Ulcer/skin breakdown will have a volume reduction of 30% by week 4 Date Initiated: 07/14/2018 Target Resolution Date: 08/14/2018 Goal Status: Active Interventions: Provide education on ulcer and skin care Treatment Activities: Referred to DME Devri Kreher for dressing supplies : 07/14/2018 Notes: Electronic Signature(s) Signed: 08/25/2018 5:34:51 PM By: Gretta Cool, BSN, RN, CWS, Kim RN, BSN Entered By: Gretta Cool, BSN, RN, CWS, Kim on 08/25/2018 11:01:29 Cathy Hines ([id number]) --------------------------------------------------------------------------------  Pain Assessment Details Patient Name: Cathy Hines, Cathy Hines Date of Service: 08/25/2018 10:45 AM Medical Record Number: [id number] Patient Account Number: [id number] Date of Birth/Sex: 1946/05/08 (72 y.o. F) Treating RN: Secundino Ginger Primary Care Kariel Skillman: Emily Filbert Other Clinician: Referring Jeriel Vivanco: Emily Filbert Treating Ahlayah Tarkowski/Extender: Tito Dine in Treatment: 6 Active Problems Location of Pain Severity and Description of Pain Patient Has Paino No Site Locations Pain Management and Medication Current Pain Management: Goals for Pain Management pt denies pain at this time. stated had intermittent pain last night to wound site. enc. to see primary for pain PRN. Electronic Signature(s) Signed: 08/25/2018 11:44:54 AM By: Secundino Ginger Entered By: Secundino Ginger on 08/25/2018  10:48:13 Cathy Hines ([id number]) -------------------------------------------------------------------------------- Patient/Caregiver Education Details Patient Name: Cathy Hines Date of Service: 08/25/2018 10:45 AM Medical Record Number: [id number] Patient Account Number: [id number] Date of Birth/Gender: 10-20-1946 (72 y.o. F) Treating RN: Cornell Barman Primary Care Physician: Emily Filbert Other Clinician: Referring Physician: Emily Filbert Treating Physician/Extender: Tito Dine in Treatment: 6 Education Assessment Education Provided To: Patient Education Topics Provided Wound/Skin Impairment: Handouts: Caring for Your Ulcer, Other: cont9nue dressing changes as prescribed Methods: Demonstration, Explain/Verbal Responses: State content correctly Electronic Signature(s) Signed: 08/25/2018 5:34:51 PM By: Gretta Cool, BSN, RN, CWS, Kim RN, BSN Entered By: Gretta Cool, BSN, RN, CWS, Kim on 08/25/2018 11:07:28 Cathy Hines ([id number]) -------------------------------------------------------------------------------- Wound Assessment Details Patient Name: Cathy Hines Date of Service: 08/25/2018 10:45 AM Medical Record Number: [id number] Patient Account Number: [id number] Date of Birth/Sex: 1946/02/05 (72 y.o. F) Treating RN: Secundino Ginger Primary Care Tailey Top: Emily Filbert Other Clinician: Referring Keeshawn Fakhouri: Emily Filbert Treating Graydon Fofana/Extender: Tito Dine in Treatment: 6 Wound Status Wound Number: 2 Primary Trauma, Other Etiology: Wound Location: Left Lower Leg - Anterior Wound Open Wounding Event: Trauma Status: Date Acquired: 07/09/2018 Comorbid Cataracts, Chronic sinus problems/congestion, Weeks Of Treatment: 6 History: Lymphedema, Asthma, Sleep Apnea, Angina, Clustered Wound: No Arrhythmia, Hypertension, Type II Diabetes, Osteoarthritis, Neuropathy Photos Photo Uploaded By: Secundino Ginger on 08/25/2018 10:56:51 Wound Measurements Length: (cm)  0.2 Width: (cm) 0.8 Depth: (cm) 0.1 Area: (cm) 0.126 Volume: (cm) 0.013 % Reduction in Area: 99.6% % Reduction in Volume: 99.6% Epithelialization: Medium (34-66%) Tunneling: No Undermining: No Wound Description Classification: Partial Thickness Foul Odor Wound Margin: Flat and Intact Slough/Fi Exudate Amount: Small Exudate Type: Serous Exudate Color: amber After Cleansing: No brino No Wound Bed Granulation Amount: Small (1-33%) Exposed Structure Granulation Quality: Red, Pink Fascia Exposed: No Necrotic Amount: Medium (34-66%) Fat Layer (Subcutaneous Tissue) Exposed: Yes Necrotic Quality: Eschar Tendon Exposed: No Muscle Exposed: No Joint Exposed: No Bone Exposed: No Cathy Hines, Cathy L. ([id number]) Periwound Skin Texture Texture Color No Abnormalities Noted: No No Abnormalities Noted: No Callus: No Atrophie Blanche: No Crepitus: No Cyanosis: No Excoriation: No Ecchymosis: No Induration: No Erythema: No Rash: No Hemosiderin Staining: No Scarring: No Mottled: No Pallor: No Moisture Rubor: No No Abnormalities Noted: No Dry / Scaly: No Temperature / Pain Maceration: No Temperature: No Abnormality Tenderness on Palpation: Yes Wound Preparation Ulcer Cleansing: Rinsed/Irrigated with Saline Topical Anesthetic Applied: Other: lidocaine 4%, Treatment Notes Wound #2 (Left, Anterior Lower Leg) Notes Mepitel One, Hydrofera Blue, Bordered Foam Dressing Electronic Signature(s) Signed: 08/25/2018 11:44:54 AM By: Secundino Ginger Entered By: Secundino Ginger on 08/25/2018 10:51:59 Cathy Hines ([id number]) -------------------------------------------------------------------------------- Vitals Details Patient Name: Cathy Hines Date of Service: 08/25/2018 10:45 AM Medical Record Number: [id number] Patient Account Number: [id number] Date of Birth/Sex: 09/20/1946 (72 y.o. F) Treating RN: Secundino Ginger Primary  Care Isadore Palecek: Emily Filbert Other Clinician: Referring Lexie Morini:  Emily Filbert Treating Gerrett Loman/Extender: Tito Dine in Treatment: 6 Vital Signs Time Taken: 10:48 Temperature (F): 98 Height (in): 59 Pulse (bpm): 61 Weight (lbs): 160 Respiratory Rate (breaths/min): 16 Body Mass Index (BMI): 32.3 Blood Pressure (mmHg): 173/63 Reference Range: 80 - 120 mg / dl Electronic Signature(s) Signed: 08/25/2018 11:44:54 AM By: Secundino Ginger Entered BySecundino Ginger on 08/25/2018 10:48:44

## 2018-09-01 ENCOUNTER — Encounter: Payer: Medicare HMO | Admitting: Internal Medicine

## 2018-09-01 DIAGNOSIS — S81812D Laceration without foreign body, left lower leg, subsequent encounter: Secondary | ICD-10-CM | POA: Diagnosis not present

## 2018-09-01 DIAGNOSIS — J45909 Unspecified asthma, uncomplicated: Secondary | ICD-10-CM | POA: Diagnosis not present

## 2018-09-01 DIAGNOSIS — E1140 Type 2 diabetes mellitus with diabetic neuropathy, unspecified: Secondary | ICD-10-CM | POA: Diagnosis not present

## 2018-09-01 DIAGNOSIS — S81812A Laceration without foreign body, left lower leg, initial encounter: Secondary | ICD-10-CM | POA: Diagnosis not present

## 2018-09-01 DIAGNOSIS — E785 Hyperlipidemia, unspecified: Secondary | ICD-10-CM | POA: Diagnosis not present

## 2018-09-01 DIAGNOSIS — I10 Essential (primary) hypertension: Secondary | ICD-10-CM | POA: Diagnosis not present

## 2018-09-01 DIAGNOSIS — L97821 Non-pressure chronic ulcer of other part of left lower leg limited to breakdown of skin: Secondary | ICD-10-CM | POA: Diagnosis not present

## 2018-09-01 DIAGNOSIS — E11622 Type 2 diabetes mellitus with other skin ulcer: Secondary | ICD-10-CM | POA: Diagnosis not present

## 2018-09-01 DIAGNOSIS — E039 Hypothyroidism, unspecified: Secondary | ICD-10-CM | POA: Diagnosis not present

## 2018-09-01 DIAGNOSIS — I4891 Unspecified atrial fibrillation: Secondary | ICD-10-CM | POA: Diagnosis not present

## 2018-09-03 NOTE — Progress Notes (Signed)
Cathy Hines, Cathy Hines ([id number]) Visit Report for 09/01/2018 Arrival Information Details Patient Name: Cathy Hines, Cathy Hines Date of Service: 09/01/2018 9:30 AM Medical Record Number: [id number] Patient Account Number: [id number] Date of Birth/Sex: 1946-10-18 (72 y.o. F) Treating RN: Secundino Ginger Primary Care Daneshia Tavano: Emily Filbert Other Clinician: Referring Sedra Morfin: Emily Filbert Treating Euel Castile/Extender: Tito Dine in Treatment: 7 Visit Information History Since Last Visit Added or deleted any medications: No Patient Arrived: Ambulatory Any new allergies or adverse reactions: No Arrival Time: 09:42 Had a fall or experienced change in No Accompanied By: self activities of daily living that may affect Transfer Assistance: None risk of falls: Patient Identification Verified: Yes Signs or symptoms of abuse/neglect since last visito No Secondary Verification Process Completed: Yes Hospitalized since last visit: No Patient Has Alerts: Yes Implantable device outside of the clinic excluding No Patient Alerts: DMII cellular tissue based products placed in the center since last visit: Has Dressing in Place as Prescribed: Yes Pain Present Now: No Electronic Signature(s) Signed: 09/01/2018 4:03:08 PM By: Secundino Ginger Entered By: Secundino Ginger on 09/01/2018 09:43:18 Cathy Hines ([id number]) -------------------------------------------------------------------------------- Clinic Level of Care Assessment Details Patient Name: Cathy Hines Date of Service: 09/01/2018 9:30 AM Medical Record Number: [id number] Patient Account Number: [id number] Date of Birth/Sex: June 07, 1946 (72 y.o. F) Treating RN: Cornell Barman Primary Care Estell Dillinger: Emily Filbert Other Clinician: Referring Isebella Upshur: Emily Filbert Treating Goldy Calandra/Extender: Tito Dine in Treatment: 7 Clinic Level of Care Assessment Items TOOL 4 Quantity Score []  - Use when only an EandM is performed on FOLLOW-UP visit  0 ASSESSMENTS - Nursing Assessment / Reassessment []  - Reassessment of Co-morbidities (includes updates in patient status) 0 X- 1 5 Reassessment of Adherence to Treatment Plan ASSESSMENTS - Wound and Skin Assessment / Reassessment X - Simple Wound Assessment / Reassessment - one wound 1 5 []  - 0 Complex Wound Assessment / Reassessment - multiple wounds []  - 0 Dermatologic / Skin Assessment (not related to wound area) ASSESSMENTS - Focused Assessment []  - Circumferential Edema Measurements - multi extremities 0 []  - 0 Nutritional Assessment / Counseling / Intervention []  - 0 Lower Extremity Assessment (monofilament, tuning fork, pulses) []  - 0 Peripheral Arterial Disease Assessment (using hand held doppler) ASSESSMENTS - Ostomy and/or Continence Assessment and Care []  - Incontinence Assessment and Management 0 []  - 0 Ostomy Care Assessment and Management (repouching, etc.) PROCESS - Coordination of Care X - Simple Patient / Family Education for ongoing care 1 15 []  - 0 Complex (extensive) Patient / Family Education for ongoing care []  - 0 Staff obtains Programmer, systems, Records, Test Results / Process Orders []  - 0 Staff telephones HHA, Nursing Homes / Clarify orders / etc []  - 0 Routine Transfer to another Facility (non-emergent condition) []  - 0 Routine Hospital Admission (non-emergent condition) []  - 0 New Admissions / Biomedical engineer / Ordering NPWT, Apligraf, etc. []  - 0 Emergency Hospital Admission (emergent condition) X- 1 10 Simple Discharge Coordination Cathy Hines, Cathy L. ([id number]) []  - 0 Complex (extensive) Discharge Coordination PROCESS - Special Needs []  - Pediatric / Minor Patient Management 0 []  - 0 Isolation Patient Management []  - 0 Hearing / Language / Visual special needs []  - 0 Assessment of Community assistance (transportation, D/C planning, etc.) []  - 0 Additional assistance / Altered mentation []  - 0 Support Surface(s) Assessment (bed,  cushion, seat, etc.) INTERVENTIONS - Wound Cleansing / Measurement X - Simple Wound Cleansing - one wound 1 5 []  - 0 Complex Wound Cleansing - multiple  wounds X- 1 5 Wound Imaging (photographs - any number of wounds) []  - 0 Wound Tracing (instead of photographs) []  - 0 Simple Wound Measurement - one wound []  - 0 Complex Wound Measurement - multiple wounds INTERVENTIONS - Wound Dressings []  - Small Wound Dressing one or multiple wounds 0 X- 1 15 Medium Wound Dressing one or multiple wounds []  - 0 Large Wound Dressing one or multiple wounds []  - 0 Application of Medications - topical []  - 0 Application of Medications - injection INTERVENTIONS - Miscellaneous []  - External ear exam 0 []  - 0 Specimen Collection (cultures, biopsies, blood, body fluids, etc.) []  - 0 Specimen(s) / Culture(s) sent or taken to Lab for analysis []  - 0 Patient Transfer (multiple staff / Civil Service fast streamer / Similar devices) []  - 0 Simple Staple / Suture removal (25 or less) []  - 0 Complex Staple / Suture removal (26 or more) []  - 0 Hypo / Hyperglycemic Management (close monitor of Blood Glucose) []  - 0 Ankle / Brachial Index (ABI) - do not check if billed separately X- 1 5 Vital Signs Cathy Hines, Cathy L. ([id number]) Has the patient been seen at the hospital within the last three years: Yes Total Score: 65 Level Of Care: New/Established - Level 2 Electronic Signature(s) Signed: 09/01/2018 5:17:02 PM By: Gretta Cool, BSN, RN, CWS, Kim RN, BSN Entered By: Gretta Cool, BSN, RN, CWS, Kim on 09/01/2018 10:01:52 Cathy Hines ([id number]) -------------------------------------------------------------------------------- Encounter Discharge Information Details Patient Name: Cathy Hines Date of Service: 09/01/2018 9:30 AM Medical Record Number: [id number] Patient Account Number: [id number] Date of Birth/Sex: Dec 26, 1945 (72 y.o. F) Treating RN: Cornell Barman Primary Care Natassja Ollis: Emily Filbert Other  Clinician: Referring Minta Fair: Emily Filbert Treating Evangelynn Lochridge/Extender: Tito Dine in Treatment: 7 Encounter Discharge Information Items Discharge Condition: Stable Ambulatory Status: Ambulatory Discharge Destination: Home Transportation: Private Auto Accompanied By: self Schedule Follow-up Appointment: Yes Clinical Summary of Care: Electronic Signature(s) Signed: 09/01/2018 5:17:02 PM By: Gretta Cool, BSN, RN, CWS, Kim RN, BSN Entered By: Gretta Cool, BSN, RN, CWS, Kim on 09/01/2018 10:03:09 Cathy Hines ([id number]) -------------------------------------------------------------------------------- Lower Extremity Assessment Details Patient Name: Cathy Hines Date of Service: 09/01/2018 9:30 AM Medical Record Number: [id number] Patient Account Number: [id number] Date of Birth/Sex: May 10, 1946 (72 y.o. F) Treating RN: Secundino Ginger Primary Care Kortny Lirette: Emily Filbert Other Clinician: Referring Derika Eckles: Emily Filbert Treating Yina Riviere/Extender: Tito Dine in Treatment: 7 Edema Assessment Assessed: [Left: No] [Right: No] [Left: Edema] [Right: :] Calf Left: Right: Point of Measurement: 32 cm From Medial Instep cm cm Ankle Left: Right: Point of Measurement: 11 cm From Medial Instep cm cm Vascular Assessment Claudication: Claudication Assessment [Left:None] [Right:None] Pulses: Dorsalis Pedis Palpable: [Left:Yes] Posterior Tibial Extremity colors, hair growth, and conditions: Extremity Color: [Left:Normal] Hair Growth on Extremity: [Left:Yes] Temperature of Extremity: [Left:Warm] Electronic Signature(s) Signed: 09/01/2018 4:03:08 PM By: Secundino Ginger Entered By: Secundino Ginger on 09/01/2018 09:50:20 Cathy Hines ([id number]) -------------------------------------------------------------------------------- Multi Wound Chart Details Patient Name: Cathy Hines Date of Service: 09/01/2018 9:30 AM Medical Record Number: [id number] Patient Account Number:  [id number] Date of Birth/Sex: [birth year] (72 y.o. F) Treating RN: Cornell Barman Primary Care Sehaj Kolden: Emily Filbert Other Clinician: Referring Mikinzie Maciejewski: Emily Filbert Treating Briauna Gilmartin/Extender: Tito Dine in Treatment: 7 Vital Signs Height(in): 59 Pulse(bpm): 64 Weight(lbs): 160 Blood Pressure(mmHg): 153/55 Body Mass Index(BMI): 32 Temperature(F): 97.7 Respiratory Rate 16 (breaths/min): Photos: [N/A:N/A] Wound Location: Left Lower Leg - Anterior N/A N/A Wounding Event: Trauma N/A N/A Primary Etiology: Trauma, Other N/A N/A Comorbid History:  Cataracts, Chronic sinus N/A N/A problems/congestion, Lymphedema, Asthma, Sleep Apnea, Angina, Arrhythmia, Hypertension, Type II Diabetes, Osteoarthritis, Neuropathy Date Acquired: 07/09/2018 N/A N/A Weeks of Treatment: 7 N/A N/A Wound Status: Open N/A N/A Measurements L x W x D 0.2x0.2x0.1 N/A N/A (cm) Area (cm) : 0.031 N/A N/A Volume (cm) : 0.003 N/A N/A % Reduction in Area: 99.90% N/A N/A % Reduction in Volume: 99.90% N/A N/A Classification: Partial Thickness N/A N/A Exudate Amount: Small N/A N/A Exudate Type: Serous N/A N/A Exudate Color: amber N/A N/A Wound Margin: Flat and Intact N/A N/A Granulation Amount: Small (1-33%) N/A N/A Granulation Quality: Pink N/A N/A Necrotic Amount: Small (1-33%) N/A N/A Necrotic Tissue: Eschar, Adherent Slough N/A N/A Exposed Structures: N/A N/A Cathy Hines, FAYE. ([id number]) Fat Layer (Subcutaneous Tissue) Exposed: Yes Fascia: No Tendon: No Muscle: No Joint: No Bone: No Epithelialization: Medium (34-66%) N/A N/A Periwound Skin Texture: Excoriation: No N/A N/A Induration: No Callus: No Crepitus: No Rash: No Scarring: No Periwound Skin Moisture: Maceration: No N/A N/A Dry/Scaly: No Periwound Skin Color: Atrophie Blanche: No N/A N/A Cyanosis: No Ecchymosis: No Erythema: No Hemosiderin Staining: No Mottled: No Pallor: No Rubor: No Temperature: No Abnormality  N/A N/A Tenderness on Palpation: Yes N/A N/A Wound Preparation: Ulcer Cleansing: N/A N/A Rinsed/Irrigated with Saline Topical Anesthetic Applied: Other: lidocaine 4% Treatment Notes Wound #2 (Left, Anterior Lower Leg) Notes Mepitel One, Hydrofera Blue, Bordered Foam Dressing Electronic Signature(s) Signed: 09/01/2018 4:50:13 PM By: Linton Ham MD Entered By: Linton Ham on 09/01/2018 10:05:17 Cathy Hines ([id number]) -------------------------------------------------------------------------------- [place] Details Patient Name: Cathy Hines Date of Service: 09/01/2018 9:30 AM Medical Record Number: [id number] Patient Account Number: [id number] Date of Birth/Sex: 1946/06/15 (72 y.o. F) Treating RN: Cornell Barman Primary Care Kharson Rasmusson: Emily Filbert Other Clinician: Referring Cozette Braggs: Emily Filbert Treating Lamar Meter/Extender: Tito Dine in Treatment: 7 Active Inactive ` Abuse / Safety / Falls / Self Care Management Nursing Diagnoses: Knowledge deficit related to: safety; personal, health (wound), emergency Goals: Patient/caregiver will verbalize understanding of skin care regimen Date Initiated: 07/14/2018 Target Resolution Date: 08/14/2018 Goal Status: Active Interventions: Podiatry chair, stretcher in low position and side rails up as needed Notes: ` Orientation to the Wound Care Program Nursing Diagnoses: Knowledge deficit related to the wound healing center program Goals: Patient/caregiver will verbalize understanding of the [place] Date Initiated: 07/14/2018 Target Resolution Date: 08/14/2018 Goal Status: Active Interventions: Provide education on orientation to the wound center Notes: ` Pain, Acute or Chronic Nursing Diagnoses: Pain, acute or chronic: actual or potential Goals: Patient will verbalize adequate pain control and receive pain control interventions during procedures as needed Date  Initiated: 07/14/2018 Target Resolution Date: 08/14/2018 Goal Status: Active Interventions: Cathy Hines, MULLAN. ([id number]) Assess comfort goal upon admission Treatment Activities: Administer pain control measures as ordered : 07/14/2018 Notes: ` Wound/Skin Impairment Nursing Diagnoses: Impaired tissue integrity Goals: Ulcer/skin breakdown will have a volume reduction of 30% by week 4 Date Initiated: 07/14/2018 Target Resolution Date: 08/14/2018 Goal Status: Active Interventions: Provide education on ulcer and skin care Treatment Activities: Referred to DME Adianna Darwin for dressing supplies : 07/14/2018 Notes: Electronic Signature(s) Signed: 09/01/2018 5:17:02 PM By: Gretta Cool, BSN, RN, CWS, Kim RN, BSN Entered By: Gretta Cool, BSN, RN, CWS, Kim on 09/01/2018 09:56:22 Cathy Hines ([id number]) -------------------------------------------------------------------------------- Pain Assessment Details Patient Name: Cathy Hines Date of Service: 09/01/2018 9:30 AM Medical Record Number: [id number] Patient Account Number: [id number] Date of Birth/Sex: 1946-04-05 (72 y.o. F) Treating RN: Secundino Ginger Primary Care  Oval Cavazos: Emily Filbert Other Clinician: Referring Armella Stogner: Emily Filbert Treating Julita Ozbun/Extender: Tito Dine in Treatment: 7 Active Problems Location of Pain Severity and Description of Pain Patient Has Paino No Site Locations Pain Management and Medication Current Pain Management: Goals for Pain Management pt denies any pain at this time. Electronic Signature(s) Signed: 09/01/2018 4:03:08 PM By: Secundino Ginger Entered By: Secundino Ginger on 09/01/2018 09:44:27 Cathy Hines ([id number]) -------------------------------------------------------------------------------- Patient/Caregiver Education Details Patient Name: Cathy Hines Date of Service: 09/01/2018 9:30 AM Medical Record Number: [id number] Patient Account Number: [id number] Date of Birth/Gender: 07/15/1946  (72 y.o. F) Treating RN: Cornell Barman Primary Care Physician: Emily Filbert Other Clinician: Referring Physician: Emily Filbert Treating Physician/Extender: Tito Dine in Treatment: 7 Education Assessment Education Provided To: Patient Education Topics Provided Wound/Skin Impairment: Handouts: Caring for Your Ulcer Methods: Demonstration, Explain/Verbal Responses: State content correctly Electronic Signature(s) Signed: 09/01/2018 5:17:02 PM By: Gretta Cool, BSN, RN, CWS, Kim RN, BSN Entered By: Gretta Cool, BSN, RN, CWS, Kim on 09/01/2018 10:02:07 Cathy Hines ([id number]) -------------------------------------------------------------------------------- Wound Assessment Details Patient Name: Cathy Hines Date of Service: 09/01/2018 9:30 AM Medical Record Number: [id number] Patient Account Number: [id number] Date of Birth/Sex: [birth year] (72 y.o. F) Treating RN: Secundino Ginger Primary Care Yara Tomkinson: Emily Filbert Other Clinician: Referring Milla Wahlberg: Emily Filbert Treating Alaster Asfaw/Extender: Tito Dine in Treatment: 7 Wound Status Wound Number: 2 Primary Trauma, Other Etiology: Wound Location: Left Lower Leg - Anterior Wound Open Wounding Event: Trauma Status: Date Acquired: 07/09/2018 Comorbid Cataracts, Chronic sinus problems/congestion, Weeks Of Treatment: 7 History: Lymphedema, Asthma, Sleep Apnea, Angina, Clustered Wound: No Arrhythmia, Hypertension, Type II Diabetes, Osteoarthritis, Neuropathy Photos Photo Uploaded By: Secundino Ginger on 09/01/2018 09:54:33 Wound Measurements Length: (cm) 0.2 Width: (cm) 0.2 Depth: (cm) 0.1 Area: (cm) 0.031 Volume: (cm) 0.003 % Reduction in Area: 99.9% % Reduction in Volume: 99.9% Epithelialization: Medium (34-66%) Tunneling: No Undermining: No Wound Description Classification: Partial Thickness Foul Odor Wound Margin: Flat and Intact Slough/Fi Exudate Amount: Small Exudate Type: Serous Exudate Color:  amber After Cleansing: No brino No Wound Bed Granulation Amount: Small (1-33%) Exposed Structure Granulation Quality: Pink Fascia Exposed: No Necrotic Amount: Small (1-33%) Fat Layer (Subcutaneous Tissue) Exposed: Yes Necrotic Quality: Eschar, Adherent Slough Tendon Exposed: No Muscle Exposed: No Joint Exposed: No Bone Exposed: No Cathy Hines, Cathy L. ([id number]) Periwound Skin Texture Texture Color No Abnormalities Noted: No No Abnormalities Noted: No Callus: No Atrophie Blanche: No Crepitus: No Cyanosis: No Excoriation: No Ecchymosis: No Induration: No Erythema: No Rash: No Hemosiderin Staining: No Scarring: No Mottled: No Pallor: No Moisture Rubor: No No Abnormalities Noted: No Dry / Scaly: No Temperature / Pain Maceration: No Temperature: No Abnormality Tenderness on Palpation: Yes Wound Preparation Ulcer Cleansing: Rinsed/Irrigated with Saline Topical Anesthetic Applied: Other: lidocaine 4%, Treatment Notes Wound #2 (Left, Anterior Lower Leg) Notes Mepitel One, Hydrofera Blue, Bordered Foam Dressing Electronic Signature(s) Signed: 09/01/2018 4:03:08 PM By: Secundino Ginger Entered By: Secundino Ginger on 09/01/2018 09:49:44 Cathy Hines ([id number]) -------------------------------------------------------------------------------- Vitals Details Patient Name: Cathy Hines Date of Service: 09/01/2018 9:30 AM Medical Record Number: [id number] Patient Account Number: [id number] Date of Birth/Sex: 1946/06/08 (72 y.o. F) Treating RN: Secundino Ginger Primary Care Tiffiany Beadles: Emily Filbert Other Clinician: Referring Carletha Dawn: Emily Filbert Treating Zekiah Caruth/Extender: Tito Dine in Treatment: 7 Vital Signs Time Taken: 09:44 Temperature (F): 97.7 Height (in): 59 Pulse (bpm): 64 Weight (lbs): 160 Respiratory Rate (breaths/min): 16 Body Mass Index (BMI): 32.3 Blood Pressure (mmHg): 153/55 Reference Range: 80 - 120 mg /  dl Electronic Signature(s) Signed:  09/01/2018 4:03:08 PM By: Secundino Ginger Entered By: Secundino Ginger on 09/01/2018 09:44:55

## 2018-09-03 NOTE — Progress Notes (Signed)
Cathy, Hines ([id number]) Visit Report for 09/01/2018 HPI Details Patient Name: Cathy Hines, Cathy Hines Date of Service: 09/01/2018 9:30 AM Medical Record Number: [id number] Patient Account Number: [id number] Date of Birth/Sex: [birth year] (72 y.o. F) Treating RN: Cornell Barman Primary Care Provider: Emily Filbert Other Clinician: Referring Provider: Emily Filbert Treating Provider/Extender: Tito Dine in Treatment: 7 History of Present Illness HPI Description: 04/07/18 ADMISSION This is a 72 year old woman with type 2 diabetes and a history of diabetic neuropathy. About a month ago she fell while walking in her yard and suffered a laceration of her right anterior shin over the tibia. She tried to re-adhere the skin flaps. She went to see her primary physician on 03/11/18 was given antibiotic ointment, Telfa and Coban and was prescribed doxycycline. I believe she had an x-ray ordered that did not show up particular abnormality. On 03/15/18 Keflex was added. Most recently she has been putting Betadine on this and Vaseline and was told to soak this in Epson salts. She does not have a wound history. She has type 2 diabetes with neuropathy, hyperlipidemia hypertension hypothyroidism mitral valve prolapse and atrial fibrillation, osteoarthritis and asthma. We could not test her ABIs on either side because of complaints about pain, even the left side doesn't have a wound 04/14/18; she comes in today telling us that the debridement I did last week caused her to vomit when she got home. We are using Iodoflex to the wound as the primary dressing under Kerlix and Coban. She also had complaints about the wrap the heat etc. 04/21/18; using Iodoflex to this traumatic area on the right anterior shin. Arise with a better looking surface although still a difficult amount of tightly adherent fibrinous debris. 04/28/18; were using to flex to the traumatic area on her right anterior shin. The wound is  contracted. She does not want compression on today. Still some adherent fibrinous debris over the surface of this. 05/05/18; Hydrofera Blue to the traumatic wound on the right anterior shin. The wound is contracting. 05/12/18; been using Hydrofera Blue to the traumatic wound on the right anterior shin this is totally closed today. READMISSION 07/14/18 Patient is here today with a one-week history of traumatic wounds on the left anterior shin area. This occurred while she was lifting sheet metal out of the trunk of her car with a part of it falling on her left anterior ankle. She has 4 open areas all and close juxtaposition. The top 3 are minor. She has a laceration/skin tear inferiorly in the wound area. This is horizontal some surface debris. She is complaining of a lot of pain although none in these wounds look like they should be that painful. She was seen in urgent care after the incident was not given any antibiotics. She was not x-rayed. She has been applying Silvadene that she got from a friend and feels that helped with the pain. she is previously here with a traumatic wound on her right anterior tibial area just 2 months ago. The left leg was too painful to do ABIs on today although it was noncompressible when she was here the last time. Today the right leg is noncompressible 07/21/18; the patient took her left leg wrap off 2 or 3 days ago. Since then she's been using Neosporin. This was only a Kerlix Coban wrap and should not have been causing her that much trouble. She arrives today with nonviable surface eschar requiring debridement 08/04/18; 2 week follow-up. The patient has been using [facility]  under border foam. She has a listed complaints however her wound is way down in dimension. She did not tolerate any form of compression but she doesn't have a lot of Minami, Averee L. ([id number]) edema 08/11/18; one-week follow-up. The patient is doing well using Hydrofera Blue under border  foam. Wound is contracting she has no complaints. 08/18/18; one-week follow-up. The patient is doing well with Hydrofera Blue under border foam. She states she dropped something on the wound site this week. There doesn't seem to be any adverse effects 08/25/18; Hydrofera Blue under border foam. The vast majority of this wound is closed. Only a small open area remains on the medial part of the horizontal wound 09/01/18; Hydrofera Blue under border foam. Small open area remains refractory. Electronic Signature(s) Signed: 09/01/2018 4:50:13 PM By: Linton Ham MD Entered By: Linton Ham on 09/01/2018 10:06:05 Cathy Hines ([id number]) -------------------------------------------------------------------------------- Physical Exam Details Patient Name: Cathy Hines Date of Service: 09/01/2018 9:30 AM Medical Record Number: [id number] Patient Account Number: [id number] Date of Birth/Sex: Apr 05, 1946 (72 y.o. F) Treating RN: Cornell Barman Primary Care Provider: Emily Filbert Other Clinician: Referring Provider: Emily Filbert Treating Provider/Extender: Tito Dine in Treatment: 7 Constitutional Patient is hypertensive.. Pulse regular and within target range for patient.Marland Kitchen Respirations regular, non-labored and within target range.. Temperature is normal and within the target range for the patient.Marland Kitchen appears in no distress. Eyes Conjunctivae clear. No discharge. Cardiovascular Pedal pulses palpable and strong bilaterally.. Notes Wound exam; she hasn't any open area not even sure this is the same one his last week. Roughly in the mid aspect of the wound inferiorly. Peripheral pulses are palpable edema is well-controlled Electronic Signature(s) Signed: 09/01/2018 4:50:13 PM By: Linton Ham MD Entered By: Linton Ham on 09/01/2018 10:07:19 Cathy Hines ([id number]) -------------------------------------------------------------------------------- Physician Orders  Details Patient Name: Cathy Hines Date of Service: 09/01/2018 9:30 AM Medical Record Number: [id number] Patient Account Number: [id number] Date of Birth/Sex: October 11, 1946 (72 y.o. F) Treating RN: Cornell Barman Primary Care Provider: Emily Filbert Other Clinician: Referring Provider: Emily Filbert Treating Provider/Extender: Tito Dine in Treatment: 7 Verbal / Phone Orders: No Diagnosis Coding Wound Cleansing Wound #2 Left,Anterior Lower Leg o Clean wound with Normal Saline. Anesthetic (add to Medication List) Wound #2 Left,Anterior Lower Leg o Topical Lidocaine 4% cream applied to wound bed prior to debridement (In Clinic Only). Primary Wound Dressing Wound #2 Left,Anterior Lower Leg o Hydrafera Blue Ready Transfer - Mepitel One under Secondary Dressing Wound #2 Left,Anterior Lower Leg o Other - telfa island Dressing Change Frequency Wound #2 Left,Anterior Lower Leg o Change Dressing Monday, Wednesday, Friday Follow-up Appointments Wound #2 Left,Anterior Lower Leg o Return Appointment in 2 weeks. Electronic Signature(s) Signed: 09/01/2018 4:50:13 PM By: Linton Ham MD Signed: 09/01/2018 5:17:02 PM By: Gretta Cool, BSN, RN, CWS, Kim RN, BSN Entered By: Gretta Cool, BSN, RN, CWS, Kim on 09/01/2018 10:01:30 ARTISHA, CAPRI ([id number]) -------------------------------------------------------------------------------- Problem List Details Patient Name: Cathy Hines Date of Service: 09/01/2018 9:30 AM Medical Record Number: [id number] Patient Account Number: [id number] Date of Birth/Sex: 1946/08/06 (72 y.o. F) Treating RN: Cornell Barman Primary Care Provider: Emily Filbert Other Clinician: Referring Provider: Emily Filbert Treating Provider/Extender: Tito Dine in Treatment: 7 Active Problems ICD-10 Evaluated Encounter Code Description Active Date Today Diagnosis S81.812D Laceration without foreign body, left lower leg, subsequent 07/14/2018 No  Yes encounter E11.622 Type 2 diabetes mellitus with other skin ulcer 07/14/2018 No Yes L97.821 Non-pressure chronic ulcer of other part of left lower  leg 07/14/2018 No Yes limited to breakdown of skin Inactive Problems Resolved Problems Electronic Signature(s) Signed: 09/01/2018 4:50:13 PM By: Linton Ham MD Entered By: Linton Ham on 09/01/2018 10:04:01 Cathy Hines ([id number]) -------------------------------------------------------------------------------- Progress Note Details Patient Name: Cathy Hines Date of Service: 09/01/2018 9:30 AM Medical Record Number: [id number] Patient Account Number: [id number] Date of Birth/Sex: August 11, 1946 (72 y.o. F) Treating RN: Cornell Barman Primary Care Provider: Emily Filbert Other Clinician: Referring Provider: Emily Filbert Treating Provider/Extender: Tito Dine in Treatment: 7 Subjective History of Present Illness (HPI) 04/07/18 ADMISSION This is a 72 year old woman with type 2 diabetes and a history of diabetic neuropathy. About a month ago she fell while walking in her yard and suffered a laceration of her right anterior shin over the tibia. She tried to re-adhere the skin flaps. She went to see her primary physician on 03/11/18 was given antibiotic ointment, Telfa and Coban and was prescribed doxycycline. I believe she had an x-ray ordered that did not show up particular abnormality. On 03/15/18 Keflex was added. Most recently she has been putting Betadine on this and Vaseline and was told to soak this in Epson salts. She does not have a wound history. She has type 2 diabetes with neuropathy, hyperlipidemia hypertension hypothyroidism mitral valve prolapse and atrial fibrillation, osteoarthritis and asthma. We could not test her ABIs on either side because of complaints about pain, even the left side doesn't have a wound 04/14/18; she comes in today telling us that the debridement I did last week caused her to vomit when  she got home. We are using Iodoflex to the wound as the primary dressing under Kerlix and Coban. She also had complaints about the wrap the heat etc. 04/21/18; using Iodoflex to this traumatic area on the right anterior shin. Arise with a better looking surface although still a difficult amount of tightly adherent fibrinous debris. 04/28/18; were using to flex to the traumatic area on her right anterior shin. The wound is contracted. She does not want compression on today. Still some adherent fibrinous debris over the surface of this. 05/05/18; Hydrofera Blue to the traumatic wound on the right anterior shin. The wound is contracting. 05/12/18; been using Hydrofera Blue to the traumatic wound on the right anterior shin this is totally closed today. READMISSION 07/14/18 Patient is here today with a one-week history of traumatic wounds on the left anterior shin area. This occurred while she was lifting sheet metal out of the trunk of her car with a part of it falling on her left anterior ankle. She has 4 open areas all and close juxtaposition. The top 3 are minor. She has a laceration/skin tear inferiorly in the wound area. This is horizontal some surface debris. She is complaining of a lot of pain although none in these wounds look like they should be that painful. She was seen in urgent care after the incident was not given any antibiotics. She was not x-rayed. She has been applying Silvadene that she got from a friend and feels that helped with the pain. she is previously here with a traumatic wound on her right anterior tibial area just 2 months ago. The left leg was too painful to do ABIs on today although it was noncompressible when she was here the last time. Today the right leg is noncompressible 07/21/18; the patient took her left leg wrap off 2 or 3 days ago. Since then she's been using Neosporin. This was only a Kerlix Coban wrap and should  not have been causing her that much trouble. She  arrives today with nonviable surface eschar requiring debridement 08/04/18; 2 week follow-up. The patient has been using Hydrofera Blue under border foam. She has a listed complaints however her wound is way down in dimension. She did not tolerate any form of compression but she doesn't have a lot of edema 08/11/18; one-week follow-up. The patient is doing well using Hydrofera Blue under border foam. Wound is contracting she has Filter, Khalise L. ([id number]) no complaints. 08/18/18; one-week follow-up. The patient is doing well with Hydrofera Blue under border foam. She states she dropped something on the wound site this week. There doesn't seem to be any adverse effects 08/25/18; Hydrofera Blue under border foam. The vast majority of this wound is closed. Only a small open area remains on the medial part of the horizontal wound 09/01/18; Hydrofera Blue under border foam. Small open area remains refractory. Objective Constitutional Patient is hypertensive.. Pulse regular and within target range for patient.Marland Kitchen Respirations regular, non-labored and within target range.. Temperature is normal and within the target range for the patient.Marland Kitchen appears in no distress. Vitals Time Taken: 9:44 AM, Height: 59 in, Weight: 160 lbs, BMI: 32.3, Temperature: 97.7 F, Pulse: 64 bpm, Respiratory Rate: 16 breaths/min, Blood Pressure: 153/55 mmHg. Eyes Conjunctivae clear. No discharge. Cardiovascular Pedal pulses palpable and strong bilaterally.. General Notes: Wound exam; she hasn't any open area not even sure this is the same one his last week. Roughly in the mid aspect of the wound inferiorly. Peripheral pulses are palpable edema is well-controlled Integumentary (Hair, Skin) Wound #2 status is Open. Original cause of wound was Trauma. The wound is located on the Left,Anterior Lower Leg. The wound measures 0.2cm length x 0.2cm width x 0.1cm depth; 0.031cm^2 area and 0.003cm^3 volume. There is Fat  Layer (Subcutaneous Tissue) Exposed exposed. There is no tunneling or undermining noted. There is a small amount of serous drainage noted. The wound margin is flat and intact. There is small (1-33%) pink granulation within the wound bed. There is a small (1-33%) amount of necrotic tissue within the wound bed including Eschar and Adherent Slough. The periwound skin appearance did not exhibit: Callus, Crepitus, Excoriation, Induration, Rash, Scarring, Dry/Scaly, Maceration, Atrophie Blanche, Cyanosis, Ecchymosis, Hemosiderin Staining, Mottled, Pallor, Rubor, Erythema. Periwound temperature was noted as No Abnormality. The periwound has tenderness on palpation. Assessment Active Problems ICD-10 Laceration without foreign body, left lower leg, subsequent encounter Type 2 diabetes mellitus with other skin ulcer Non-pressure chronic ulcer of other part of left lower leg limited to breakdown of skin Matura, Ilda L. ([id number]) Plan Wound Cleansing: Wound #2 Left,Anterior Lower Leg: Clean wound with Normal Saline. Anesthetic (add to Medication List): Wound #2 Left,Anterior Lower Leg: Topical Lidocaine 4% cream applied to wound bed prior to debridement (In Clinic Only). Primary Wound Dressing: Wound #2 Left,Anterior Lower Leg: Hydrafera Blue Ready Transfer - Mepitel One under Secondary Dressing: Wound #2 Left,Anterior Lower Leg: Other - telfa island Dressing Change Frequency: Wound #2 Left,Anterior Lower Leg: Change Dressing Monday, Wednesday, Friday Follow-up Appointments: Wound #2 Left,Anterior Lower Leg: Return Appointment in 2 weeks. #1 replace the Hydrofera Blue dressing. If this doesn't closed by next week I'll choose an alternative Electronic Signature(s) Signed: 09/01/2018 4:50:13 PM By: Linton Ham MD Entered By: Linton Ham on 09/01/2018 10:20:21 Cathy Hines ([id number]) -------------------------------------------------------------------------------- [place]  Details Patient Name: Cathy Hines Date of Service: 09/01/2018 Medical Record Number: [id number] Patient Account Number: [id number] Date of Birth/Sex: [birth year] (72 y.o. F)  Treating RN: Cornell Barman Primary Care Provider: Emily Filbert Other Clinician: Referring Provider: Emily Filbert Treating Provider/Extender: Tito Dine in Treatment: 7 Diagnosis Coding ICD-10 Codes Code Description [phone] Laceration without foreign body, left lower leg, subsequent encounter E11.622 Type 2 diabetes mellitus with other skin ulcer L97.821 Non-pressure chronic ulcer of other part of left lower leg limited to breakdown of skin Facility Procedures CPT4 Code: [id number] Description: [phone] - WOUND CARE VISIT-LEV 2 EST PT Modifier: Quantity: 1 Physician Procedures CPT4 Code Description: [id number] - WC PHYS LEVEL 2 - EST PT ICD-10 Diagnosis Description S81.812D Laceration without foreign body, left lower leg, subsequent enc E11.622 Type 2 diabetes mellitus with other skin ulcer L97.821 Non-pressure chronic  ulcer of other part of left lower leg limi Modifier: ounter ted to breakdown Quantity: 1 of skin Electronic Signature(s) Signed: 09/01/2018 4:50:13 PM By: Linton Ham MD Entered By: Linton Ham on 09/01/2018 10:20:47

## 2018-09-10 NOTE — Progress Notes (Signed)
Cathy Hines ([id number]) Visit Report for 07/14/2018 Chief Complaint Document Details Patient Name: Cathy Hines, Cathy Hines Date of Service: 07/14/2018 8:00 AM Medical Record Number: [id number] Patient Account Number: [id number] Date of Birth/Sex: Mar 06, 1946 (72 y.o. F) Treating RN: Cornell Barman Primary Care Provider: Emily Filbert Other Clinician: Referring Provider: Emily Filbert Treating Provider/Extender: Tito Dine in Treatment: 0 Information Obtained from: Patient Chief Complaint 04/07/18; patient is here for a laceration type injury on the right anterior shin for 1 month 07/14/18; patient is here for a traumatic wound on the left anterior shin that is been present for a little over a week Electronic Signature(s) Signed: 07/21/2018 4:51:46 PM By: Linton Ham MD Signed: 07/23/2018 3:12:23 PM By: Sharon Mt Previous Signature: 07/14/2018 5:12:47 PM Version By: Linton Ham MD Entered By: Sharon Mt on 07/16/2018 15:03:00 Cathy Hines ([id number]) -------------------------------------------------------------------------------- HPI Details Patient Name: Cathy Hines Date of Service: 07/14/2018 8:00 AM Medical Record Number: [id number] Patient Account Number: [id number] Date of Birth/Sex: 19-Jun-1946 (72 y.o. F) Treating RN: Cornell Barman Primary Care Provider: Emily Filbert Other Clinician: Referring Provider: Emily Filbert Treating Provider/Extender: Tito Dine in Treatment: 0 History of Present Illness HPI Description: 04/07/18 ADMISSION This is a 72 year old woman with type 2 diabetes and a history of diabetic neuropathy. About a month ago she fell while walking in her yard and suffered a laceration of her right anterior shin over the tibia. She tried to re-adhere the skin flaps. She went to see her primary physician on 03/11/18 was given antibiotic ointment, Telfa and Coban and was prescribed doxycycline. I believe she had an x-ray ordered that did not show  up particular abnormality. On 03/15/18 Keflex was added. Most recently she has been putting Betadine on this and Vaseline and was told to soak this in Epson salts. She does not have a wound history. She has type 2 diabetes with neuropathy, hyperlipidemia hypertension hypothyroidism mitral valve prolapse and atrial fibrillation, osteoarthritis and asthma. We could not test her ABIs on either side because of complaints about pain, even the left side doesn't have a wound 04/14/18; she comes in today telling us that the debridement I did last week caused her to vomit when she got home. We are using Iodoflex to the wound as the primary dressing under Kerlix and Coban. She also had complaints about the wrap the heat etc. 04/21/18; using Iodoflex to this traumatic area on the right anterior shin. Arise with a better looking surface although still a difficult amount of tightly adherent fibrinous debris. 04/28/18; were using to flex to the traumatic area on her right anterior shin. The wound is contracted. She does not want compression on today. Still some adherent fibrinous debris over the surface of this. 05/05/18; Hydrofera Blue to the traumatic wound on the right anterior shin. The wound is contracting. 05/12/18; been using Hydrofera Blue to the traumatic wound on the right anterior shin this is totally closed today. READMISSION 07/14/18 Patient is here today with a one-week history of traumatic wounds on the left anterior shin area. This occurred while she was lifting sheet metal out of the trunk of her car with a part of it falling on her left anterior ankle. She has 4 open areas all and close juxtaposition. The top 3 are minor. She has a laceration/skin tear inferiorly in the wound area. This is horizontal some surface debris. She is complaining of a lot of pain although none in these wounds look like they should be that painful.  She was seen in urgent care after the incident was not given any  antibiotics. She was not x-rayed. She has been applying Silvadene that she got from a friend and feels that helped with the pain. she is previously here with a traumatic wound on her right anterior tibial area just 2 months ago. The left leg was too painful to do ABIs on today although it was noncompressible when she was here the last time. Today the right leg is noncompressible Electronic Signature(s) Signed: 07/21/2018 4:51:46 PM By: Linton Ham MD Signed: 07/23/2018 3:12:23 PM By: Sharon Mt Previous Signature: 07/14/2018 5:12:47 PM Version By: Linton Ham MD Entered By: Sharon Mt on 07/16/2018 15:03:08 Cathy Hines ([id number]) -------------------------------------------------------------------------------- Physical Exam Details Patient Name: Cathy Hines Date of Service: 07/14/2018 8:00 AM Medical Record Number: [id number] Patient Account Number: [id number] Date of Birth/Sex: 1946/09/22 (72 y.o. F) Treating RN: Cornell Barman Primary Care Provider: Emily Filbert Other Clinician: Referring Provider: Emily Filbert Treating Provider/Extender: Tito Dine in Treatment: 0 Constitutional Patient is hypertensive.. Pulse regular and within target range for patient.Marland Kitchen Respirations regular, non-labored and within target range.. Temperature is normal and within the target range for the patient.Marland Kitchen appears in no distress. Eyes Conjunctivae clear. No discharge. Respiratory Respiratory effort is easy and symmetric bilaterally. Rate is normal at rest and on room air.. Cardiovascular popliteal pulses palpable. Pedal pulses palpable and strong bilaterally.. I'll be some degree of chronic venous insufficiency. There is edema/swelling around the wound area. Integumentary (Hair, Skin) chronic venous insufficiency seems likely mild edema in her legs but no other primary skin issue. Psychiatric No evidence of depression, anxiety, or agitation. Calm, cooperative, and communicative.  Appropriate interactions and affect.. Notes wound exam; this sheet metal fell against her left anterior tibia and slid down. The largest wound is most inferiorly with some with to it. There is some debris on the surface of the wound although I did not debride this. Also underneath perhaps some skin that will turn out to be nonviable from the skin tear. The more superior wounds look healthy. I didn't debride any of these areas. Electronic Signature(s) Signed: 07/21/2018 4:51:46 PM By: Linton Ham MD Signed: 07/23/2018 3:12:23 PM By: Sharon Mt Previous Signature: 07/14/2018 5:12:47 PM Version By: Linton Ham MD Entered By: Sharon Mt on 07/16/2018 15:03:18 Cathy Hines ([id number]) -------------------------------------------------------------------------------- Physician Orders Details Patient Name: Cathy Hines Date of Service: 07/14/2018 8:00 AM Medical Record Number: [id number] Patient Account Number: [id number] Date of Birth/Sex: 12/01/1945 (72 y.o. F) Treating RN: Cornell Barman Primary Care Provider: Emily Filbert Other Clinician: Referring Provider: Emily Filbert Treating Provider/Extender: Melburn Hake, HOYT Weeks in Treatment: 0 Verbal / Phone Orders: No Diagnosis Coding Wound Cleansing Wound #2 Left,Anterior Lower Leg o Clean wound with Normal Saline. Anesthetic (add to Medication List) Wound #2 Left,Anterior Lower Leg o Topical Lidocaine 4% cream applied to wound bed prior to debridement (In Clinic Only). Primary Wound Dressing Wound #2 Left,Anterior Lower Leg o Silver Collagen Secondary Dressing Wound #2 Left,Anterior Lower Leg o ABD pad Dressing Change Frequency Wound #2 Left,Anterior Lower Leg o Change dressing every week Follow-up Appointments Wound #2 Left,Anterior Lower Leg o Return Appointment in 1 week. Edema Control Wound #2 Left,Anterior Lower Leg o Kerlix and Coban - Left Lower Extremity Electronic Signature(s) Signed: 07/23/2018 3:12:23  PM By: Sharon Mt Signed: 09/09/2018 3:24:21 AM By: Worthy Keeler PA-C Previous Signature: 07/14/2018 5:28:07 PM Version By: Gretta Cool, BSN, RN, CWS, Kim RN, BSN Entered By: Sharon Mt on  07/16/2018 15:02:16 Cathy Hines, Cathy Hines ([id number]) -------------------------------------------------------------------------------- Problem List Details Patient Name: ISLAY, POLANCO Date of Service: 07/14/2018 8:00 AM Medical Record Number: [id number] Patient Account Number: [id number] Date of Birth/Sex: Jun 28, 1946 (72 y.o. F) Treating RN: Cornell Barman Primary Care Provider: Emily Filbert Other Clinician: Referring Provider: Emily Filbert Treating Provider/Extender: Tito Dine in Treatment: 0 Active Problems ICD-10 Evaluated Encounter Code Description Active Date Today Diagnosis S81.812D Laceration without foreign body, left lower leg, subsequent 07/14/2018 No Yes encounter E11.622 Type 2 diabetes mellitus with other skin ulcer 07/14/2018 No Yes L97.821 Non-pressure chronic ulcer of other part of left lower leg 07/14/2018 No Yes limited to breakdown of skin Inactive Problems Resolved Problems Electronic Signature(s) Signed: 07/21/2018 4:51:46 PM By: Linton Ham MD Signed: 07/23/2018 3:12:23 PM By: Sharon Mt Previous Signature: 07/14/2018 5:12:47 PM Version By: Linton Ham MD Entered By: Sharon Mt on 07/16/2018 15:02:50 Cathy Hines ([id number]) -------------------------------------------------------------------------------- Progress Note Details Patient Name: Cathy Hines Date of Service: 07/14/2018 8:00 AM Medical Record Number: [id number] Patient Account Number: [id number] Date of Birth/Sex: Apr 11, 1946 (72 y.o. F) Treating RN: Cornell Barman Primary Care Provider: Emily Filbert Other Clinician: Referring Provider: Emily Filbert Treating Provider/Extender: Tito Dine in Treatment: 0 Subjective Chief Complaint Information obtained from Patient 04/07/18; patient is  here for a laceration type injury on the right anterior shin for 1 month 07/14/18; patient is here for a traumatic wound on the left anterior shin that is been present for a little over a week History of Present Illness (HPI) 04/07/18 ADMISSION This is a 72 year old woman with type 2 diabetes and a history of diabetic neuropathy. About a month ago she fell while walking in her yard and suffered a laceration of her right anterior shin over the tibia. She tried to re-adhere the skin flaps. She went to see her primary physician on 03/11/18 was given antibiotic ointment, Telfa and Coban and was prescribed doxycycline. I believe she had an x-ray ordered that did not show up particular abnormality. On 03/15/18 Keflex was added. Most recently she has been putting Betadine on this and Vaseline and was told to soak this in Epson salts. She does not have a wound history. She has type 2 diabetes with neuropathy, hyperlipidemia hypertension hypothyroidism mitral valve prolapse and atrial fibrillation, osteoarthritis and asthma. We could not test her ABIs on either side because of complaints about pain, even the left side doesn't have a wound 04/14/18; she comes in today telling us that the debridement I did last week caused her to vomit when she got home. We are using Iodoflex to the wound as the primary dressing under Kerlix and Coban. She also had complaints about the wrap the heat etc. 04/21/18; using Iodoflex to this traumatic area on the right anterior shin. Arise with a better looking surface although still a difficult amount of tightly adherent fibrinous debris. 04/28/18; were using to flex to the traumatic area on her right anterior shin. The wound is contracted. She does not want compression on today. Still some adherent fibrinous debris over the surface of this. 05/05/18; Hydrofera Blue to the traumatic wound on the right anterior shin. The wound is contracting. 05/12/18; been using Hydrofera Blue to the  traumatic wound on the right anterior shin this is totally closed today. READMISSION 07/14/18 Patient is here today with a one-week history of traumatic wounds on the left anterior shin area. This occurred while she was lifting sheet metal out of the trunk of her car with a  part of it falling on her left anterior ankle. She has 4 open areas all and close juxtaposition. The top 3 are minor. She has a laceration/skin tear inferiorly in the wound area. This is horizontal some surface debris. She is complaining of a lot of pain although none in these wounds look like they should be that painful. She was seen in urgent care after the incident was not given any antibiotics. She was not x-rayed. She has been applying Silvadene that she got from a friend and feels that helped with the pain. she is previously here with a traumatic wound on her right anterior tibial area just 2 months ago. The left leg was too painful to do ABIs on today although it was noncompressible when she was here the last time. Today the right leg is noncompressible Cathy Hines, Cathy L. ([id number]) Wound History Patient presents with 1 open wound that has been present for approximately 1 week. Patient has been treating wound in the following manner: bactroban and bandage. Laboratory tests have not been performed in the last month. Patient reportedly has not tested positive for an antibiotic resistant organism. Patient reportedly has not tested positive for osteomyelitis. Patient reportedly has not had testing performed to evaluate circulation in the legs. Patient History Information obtained from Patient. Allergies Sulfa (Sulfonamide Antibiotics), ACE Inhibitors, Augmentin, gabapentin, kiwi, Namenda, Statins-Hmg-Coa Reductase Inhibitors, verapamil, lactose Family History Cancer - Child,Siblings,Mother, Diabetes - Siblings,Mother,Child, Heart Disease - Mother,Siblings, Hypertension - Mother,Siblings, Lung Disease - Mother, Stroke -  Mother,Father, No family history of Hereditary Spherocytosis, Kidney Disease, Seizures, Thyroid Problems, Tuberculosis. Social History Former smoker - quit in 1980, Alcohol Use - Never, Drug Use - No History, Caffeine Use - Daily. Review of Systems (ROS) Eyes Denies complaints or symptoms of Dry Eyes, Vision Changes, Glasses / Contacts. Ear/Nose/Mouth/Throat Denies complaints or symptoms of Difficult clearing ears, Sinusitis. Hematologic/Lymphatic Denies complaints or symptoms of Bleeding / Clotting Disorders, Human Immunodeficiency Virus. Respiratory Denies complaints or symptoms of Chronic or frequent coughs, Shortness of Breath. Cardiovascular Denies complaints or symptoms of Chest pain, LE edema. Gastrointestinal Denies complaints or symptoms of Frequent diarrhea, Nausea, Vomiting. Endocrine Denies complaints or symptoms of Hepatitis, Thyroid disease, Polydypsia (Excessive Thirst). Genitourinary Denies complaints or symptoms of Kidney failure/ Dialysis, Incontinence/dribbling. Immunological Denies complaints or symptoms of Hives, Itching. Integumentary (Skin) Complains or has symptoms of Wounds. Denies complaints or symptoms of Bleeding or bruising tendency, Breakdown, Swelling. Musculoskeletal Denies complaints or symptoms of Muscle Pain, Muscle Weakness. Neurologic Denies complaints or symptoms of Numbness/parasthesias, Focal/Weakness. Psychiatric Denies complaints or symptoms of Anxiety, Claustrophobia. Objective Cathy Hines, Cathy L. ([id number]) Constitutional Patient is hypertensive.. Pulse regular and within target range for patient.Marland Kitchen Respirations regular, non-labored and within target range.. Temperature is normal and within the target range for the patient.Marland Kitchen appears in no distress. Vitals Time Taken: 8:23 AM, Height: 59 in, Source: Measured, Weight: 160 lbs, Source: Measured, BMI: 32.3, Temperature: 98.0 F, Pulse: 66 bpm, Respiratory Rate: 16 breaths/min, Blood  Pressure: 152/66 mmHg. Eyes Conjunctivae clear. No discharge. Respiratory Respiratory effort is easy and symmetric bilaterally. Rate is normal at rest and on room air.. Cardiovascular popliteal pulses palpable. Pedal pulses palpable and strong bilaterally.. I'll be some degree of chronic venous insufficiency. There is edema/swelling around the wound area. Psychiatric No evidence of depression, anxiety, or agitation. Calm, cooperative, and communicative. Appropriate interactions and affect.. General Notes: wound exam; this sheet metal fell against her left anterior tibia and slid down. The largest wound is most inferiorly with some with to  it. There is some debris on the surface of the wound although I did not debride this. Also underneath perhaps some skin that will turn out to be nonviable from the skin tear. The more superior wounds look healthy. I didn't debride any of these areas. Integumentary (Hair, Skin) chronic venous insufficiency seems likely mild edema in her legs but no other primary skin issue. Wound #2 status is Open. Original cause of wound was Trauma. The wound is located on the Left,Anterior Lower Leg. The wound measures 9cm length x 5cm width x 0.1cm depth; 35.343cm^2 area and 3.534cm^3 volume. There is no tunneling or undermining noted. There is a small amount of serous drainage noted. The wound margin is flat and intact. There is no granulation within the wound bed. There is a small (1-33%) amount of necrotic tissue within the wound bed including Eschar. The periwound skin appearance exhibited: Erythema. The periwound skin appearance did not exhibit: Callus, Crepitus, Excoriation, Induration, Rash, Scarring, Dry/Scaly, Maceration, Atrophie Blanche, Cyanosis, Ecchymosis, Hemosiderin Staining, Mottled, Pallor, Rubor. The surrounding wound skin color is noted with erythema which is circumferential. Periwound temperature was noted as No Abnormality. Assessment Active  Problems ICD-10 Laceration without foreign body, left lower leg, subsequent encounter Type 2 diabetes mellitus with other skin ulcer Non-pressure chronic ulcer of other part of left lower leg limited to breakdown of skin Plan Cathy Hines, Cathy L. ([id number]) Wound Cleansing: Wound #2 Left,Anterior Lower Leg: Clean wound with Normal Saline. Anesthetic (add to Medication List): Wound #2 Left,Anterior Lower Leg: Topical Lidocaine 4% cream applied to wound bed prior to debridement (In Clinic Only). Primary Wound Dressing: Wound #2 Left,Anterior Lower Leg: Silver Collagen Secondary Dressing: Wound #2 Left,Anterior Lower Leg: ABD pad Dressing Change Frequency: Wound #2 Left,Anterior Lower Leg: Change dressing every week Follow-up Appointments: Wound #2 Left,Anterior Lower Leg: Return Appointment in 1 week. Edema Control: Wound #2 Left,Anterior Lower Leg: Kerlix and Coban - Left Lower Extremity #1 we applied silver collagen/ABD/Kerlix Coban compression #2 I'm not certain that I really trust this patient to do her own dressings which had something to do with my selection of ongoing compression. She also had edema around the wound. #3 she is a diabetic but no convincing evidence of PAD. She has some swelling around the wound and probably some venous insufficiency although both of her wounds now have been secondary to trauma. Electronic Signature(s) Signed: 07/21/2018 4:51:46 PM By: Linton Ham MD Signed: 07/23/2018 3:12:23 PM By: Sharon Mt Previous Signature: 07/14/2018 9:59:59 AM Version By: Linton Ham MD Entered By: Sharon Mt on 07/16/2018 15:03:35 Cathy Hines ([id number]) -------------------------------------------------------------------------------- ROS/PFSH Details Patient Name: Cathy Hines Date of Service: 07/14/2018 8:00 AM Medical Record Number: [id number] Patient Account Number: [id number] Date of Birth/Sex: May 15, 1946 (72 y.o. F) Treating RN: Montey Hora Primary Care Provider: Emily Filbert Other Clinician: Referring Provider: Emily Filbert Treating Provider/Extender: Tito Dine in Treatment: 0 Information Obtained From Patient Wound History Do you currently have one or more open woundso Yes How many open wounds do you currently haveo 1 Approximately how long have you had your woundso 1 week How have you been treating your wound(s) until nowo bactroban and bandage Has your wound(s) ever healed and then re-openedo No Have you had any lab work done in the past montho No Have you tested positive for an antibiotic resistant organism (MRSA, VRE)o No Have you tested positive for osteomyelitis (bone infection)o No Have you had any tests for circulation on your legso No Eyes Complaints and  Symptoms: Negative for: Dry Eyes; Vision Changes; Glasses / Contacts Medical History: Positive for: Cataracts - bilateral Negative for: Glaucoma; Optic Neuritis Ear/Nose/Mouth/Throat Complaints and Symptoms: Negative for: Difficult clearing ears; Sinusitis Medical History: Positive for: Chronic sinus problems/congestion Negative for: Middle ear problems Hematologic/Lymphatic Complaints and Symptoms: Negative for: Bleeding / Clotting Disorders; Human Immunodeficiency Virus Medical History: Positive for: Lymphedema Negative for: Anemia; Hemophilia; Human Immunodeficiency Virus; Sickle Cell Disease Respiratory Complaints and Symptoms: Negative for: Chronic or frequent coughs; Shortness of Breath Medical History: Positive for: Asthma; Sleep Apnea Negative for: Aspiration; Chronic Obstructive Pulmonary Disease (COPD); Pneumothorax; Tuberculosis Cathy Hines, Cathy L. ([id number]) Cardiovascular Complaints and Symptoms: Negative for: Chest pain; LE edema Medical History: Positive for: Angina; Arrhythmia - afib and micro valve prolapse; Hypertension Negative for: Congestive Heart Failure; Coronary Artery Disease; Deep Vein Thrombosis;  Hypotension; Myocardial Infarction; Peripheral Arterial Disease; Peripheral Venous Disease; Phlebitis; Vasculitis Gastrointestinal Complaints and Symptoms: Negative for: Frequent diarrhea; Nausea; Vomiting Medical History: Negative for: Cirrhosis ; Colitis; Crohnos; Hepatitis A; Hepatitis B; Hepatitis C Endocrine Complaints and Symptoms: Negative for: Hepatitis; Thyroid disease; Polydypsia (Excessive Thirst) Medical History: Positive for: Type II Diabetes Negative for: Type I Diabetes Time with diabetes: 10 years Treated with: Oral agents Blood sugar tested every day: No Genitourinary Complaints and Symptoms: Negative for: Kidney failure/ Dialysis; Incontinence/dribbling Medical History: Negative for: End Stage Renal Disease Immunological Complaints and Symptoms: Negative for: Hives; Itching Medical History: Negative for: Lupus Erythematosus; Raynaudos; Scleroderma Integumentary (Skin) Complaints and Symptoms: Positive for: Wounds Negative for: Bleeding or bruising tendency; Breakdown; Swelling Medical History: Negative for: History of Burn; History of pressure wounds Musculoskeletal Complaints and Symptoms: Negative for: Muscle Pain; Muscle Weakness Cathy Hines, Cathy L. ([id number]) Medical History: Positive for: Osteoarthritis Negative for: Gout; Rheumatoid Arthritis; Osteomyelitis Neurologic Complaints and Symptoms: Negative for: Numbness/parasthesias; Focal/Weakness Medical History: Positive for: Neuropathy Negative for: Dementia; Quadriplegia; Paraplegia; Seizure Disorder Psychiatric Complaints and Symptoms: Negative for: Anxiety; Claustrophobia Medical History: Negative for: Anorexia/bulimia; Confinement Anxiety HBO Extended History Items Eyes: Ear/Nose/Mouth/Throat: Cataracts Chronic sinus problems/congestion Immunizations Pneumococcal Vaccine: Received Pneumococcal Vaccination: No Tetanus Vaccine: Last tetanus shot: 03/07/2018 Implantable Devices Family  and Social History Cancer: Yes - Child,Siblings,Mother; Diabetes: Yes - Siblings,Mother,Child; Heart Disease: Yes - Mother,Siblings; Hereditary Spherocytosis: No; Hypertension: Yes - Mother,Siblings; Kidney Disease: No; Lung Disease: Yes - Mother; Seizures: No; Stroke: Yes - Mother,Father; Thyroid Problems: No; Tuberculosis: No; Former smoker - quit in 1980; Alcohol Use: Never; Drug Use: No History; Caffeine Use: Daily; Financial Concerns: No; Food, Clothing or Shelter Needs: No; Support System Lacking: No; Transportation Concerns: No; Advanced Directives: No; Patient does not want information on Advanced Directives; Do not resuscitate: No; Living Will: No; Medical Power of Attorney: No Electronic Signature(s) Signed: 07/16/2018 4:44:09 PM By: Montey Hora Signed: 07/21/2018 4:51:46 PM By: Linton Ham MD Signed: 07/23/2018 3:12:23 PM By: Sharon Mt Previous Signature: 07/14/2018 5:10:17 PM Version By: Montey Hora Entered By: Sharon Mt on 07/16/2018 14:58:44 Cathy Hines ([id number]) -------------------------------------------------------------------------------- [place] Details Patient Name: Cathy Hines Date of Service: 07/14/2018 Medical Record Number: [id number] Patient Account Number: [id number] Date of Birth/Sex: 1946/02/14 (72 y.o. F) Treating RN: Cornell Barman Primary Care Provider: Emily Filbert Other Clinician: Referring Provider: Emily Filbert Treating Provider/Extender: Tito Dine in Treatment: 0 Diagnosis Coding ICD-10 Codes Code Description [phone] Laceration without foreign body, left lower leg, subsequent encounter E11.622 Type 2 diabetes mellitus with other skin ulcer L97.821 Non-pressure chronic ulcer of other part of left lower leg limited to breakdown of skin Facility Procedures CPT4 Code:  [id number] Description: 99213 - WOUND CARE VISIT-LEV 3 EST PT Modifier: Quantity: 1 Physician Procedures CPT4 Code Description: [id number] 14996 - WC PHYS  LEVEL 3 - EST PT ICD-10 Diagnosis Description S81.812D Laceration without foreign body, left lower leg, subsequent enc E11.622 Type 2 diabetes mellitus with other skin ulcer L97.821 Non-pressure chronic  ulcer of other part of left lower leg limi Modifier: ounter ted to breakdown Quantity: 1 of skin Electronic Signature(s) Signed: 07/21/2018 4:51:46 PM By: Linton Ham MD Signed: 07/23/2018 3:12:23 PM By: Sharon Mt Previous Signature: 07/14/2018 5:12:47 PM Version By: Linton Ham MD Entered By: Sharon Mt on 07/16/2018 15:02:39

## 2018-09-15 ENCOUNTER — Encounter: Payer: Medicare HMO | Admitting: Internal Medicine

## 2018-09-15 DIAGNOSIS — S81802A Unspecified open wound, left lower leg, initial encounter: Secondary | ICD-10-CM | POA: Diagnosis not present

## 2018-09-15 DIAGNOSIS — E785 Hyperlipidemia, unspecified: Secondary | ICD-10-CM | POA: Diagnosis not present

## 2018-09-15 DIAGNOSIS — E11622 Type 2 diabetes mellitus with other skin ulcer: Secondary | ICD-10-CM | POA: Diagnosis not present

## 2018-09-15 DIAGNOSIS — S81812D Laceration without foreign body, left lower leg, subsequent encounter: Secondary | ICD-10-CM | POA: Diagnosis not present

## 2018-09-15 DIAGNOSIS — I4891 Unspecified atrial fibrillation: Secondary | ICD-10-CM | POA: Diagnosis not present

## 2018-09-15 DIAGNOSIS — J45909 Unspecified asthma, uncomplicated: Secondary | ICD-10-CM | POA: Diagnosis not present

## 2018-09-15 DIAGNOSIS — E039 Hypothyroidism, unspecified: Secondary | ICD-10-CM | POA: Diagnosis not present

## 2018-09-15 DIAGNOSIS — I10 Essential (primary) hypertension: Secondary | ICD-10-CM | POA: Diagnosis not present

## 2018-09-15 DIAGNOSIS — L97821 Non-pressure chronic ulcer of other part of left lower leg limited to breakdown of skin: Secondary | ICD-10-CM | POA: Diagnosis not present

## 2018-09-15 DIAGNOSIS — E1140 Type 2 diabetes mellitus with diabetic neuropathy, unspecified: Secondary | ICD-10-CM | POA: Diagnosis not present

## 2018-09-18 NOTE — Progress Notes (Signed)
MIRELA, PARSLEY ([id number]) Visit Report for 09/15/2018 HPI Details Patient Name: Cathy Hines, Cathy Hines Date of Service: 09/15/2018 11:00 AM Medical Record Number: [id number] Patient Account Number: [id number] Date of Birth/Sex: 12-Jun-1946 (72 y.o. F) Treating RN: Cornell Barman Primary Care Provider: Emily Filbert Other Clinician: Referring Provider: Emily Filbert Treating Provider/Extender: Tito Dine in Treatment: 9 History of Present Illness HPI Description: 04/07/18 ADMISSION This is a 72 year old woman with type 2 diabetes and a history of diabetic neuropathy. About a month ago she fell while walking in her yard and suffered a laceration of her right anterior shin over the tibia. She tried to re-adhere the skin flaps. She went to see her primary physician on 03/11/18 was given antibiotic ointment, Telfa and Coban and was prescribed doxycycline. I believe she had an x-ray ordered that did not show up particular abnormality. On 03/15/18 Keflex was added. Most recently she has been putting Betadine on this and Vaseline and was told to soak this in Epson salts. She does not have a wound history. She has type 2 diabetes with neuropathy, hyperlipidemia hypertension hypothyroidism mitral valve prolapse and atrial fibrillation, osteoarthritis and asthma. We could not test her ABIs on either side because of complaints about pain, even the left side doesn't have a wound 04/14/18; she comes in today telling us that the debridement I did last week caused her to vomit when she got home. We are using Iodoflex to the wound as the primary dressing under Kerlix and Coban. She also had complaints about the wrap the heat etc. 04/21/18; using Iodoflex to this traumatic area on the right anterior shin. Arise with a better looking surface although still a difficult amount of tightly adherent fibrinous debris. 04/28/18; were using to flex to the traumatic area on her right anterior shin. The wound is  contracted. She does not want compression on today. Still some adherent fibrinous debris over the surface of this. 05/05/18; Hydrofera Blue to the traumatic wound on the right anterior shin. The wound is contracting. 05/12/18; been using Hydrofera Blue to the traumatic wound on the right anterior shin this is totally closed today. READMISSION 07/14/18 Patient is here today with a one-week history of traumatic wounds on the left anterior shin area. This occurred while she was lifting sheet metal out of the trunk of her car with a part of it falling on her left anterior ankle. She has 4 open areas all and close juxtaposition. The top 3 are minor. She has a laceration/skin tear inferiorly in the wound area. This is horizontal some surface debris. She is complaining of a lot of pain although none in these wounds look like they should be that painful. She was seen in urgent care after the incident was not given any antibiotics. She was not x-rayed. She has been applying Silvadene that she got from a friend and feels that helped with the pain. she is previously here with a traumatic wound on her right anterior tibial area just 2 months ago. The left leg was too painful to do ABIs on today although it was noncompressible when she was here the last time. Today the right leg is noncompressible 07/21/18; the patient took her left leg wrap off 2 or 3 days ago. Since then she's been using Neosporin. This was only a Kerlix Coban wrap and should not have been causing her that much trouble. She arrives today with nonviable surface eschar requiring debridement 08/04/18; 2 week follow-up. The patient has been using [facility]  under border foam. She has a listed complaints however her wound is way down in dimension. She did not tolerate any form of compression but she doesn't have a lot of Cathy Hines, Cathy L. ([id number]) edema 08/11/18; one-week follow-up. The patient is doing well using Hydrofera Blue under border  foam. Wound is contracting she has no complaints. 08/18/18; one-week follow-up. The patient is doing well with Hydrofera Blue under border foam. She states she dropped something on the wound site this week. There doesn't seem to be any adverse effects 08/25/18; Hydrofera Blue under border foam. The vast majority of this wound is closed. Only a small open area remains on the medial part of the horizontal wound 09/01/18; Hydrofera Blue under border foam. Small open area remains refractory. 09/15/18; the patient arrives with the area fully epithelialized and closed.She has a history of trauma in the area. Probably has some degree of chronic venous insufficiency and solar induced skin damage. I would not be able to talk her into any form of compression stocking Electronic Signature(s) Signed: 09/15/2018 5:32:05 PM By: Linton Ham MD Entered By: Linton Ham on 09/15/2018 13:42:58 Cathy Hines ([id number]) -------------------------------------------------------------------------------- Physical Exam Details Patient Name: Cathy Hines Date of Service: 09/15/2018 11:00 AM Medical Record Number: [id number] Patient Account Number: [id number] Date of Birth/Sex: 09-18-1946 (72 y.o. F) Treating RN: Cornell Barman Primary Care Provider: Emily Filbert Other Clinician: Referring Provider: Emily Filbert Treating Provider/Extender: Tito Dine in Treatment: 9 Constitutional Sitting or standing Blood Pressure is within target range for patient.. Pulse regular and within target range for patient.Marland Kitchen Respirations regular, non-labored and within target range.. Temperature is normal and within the target range for the patient.Marland Kitchen appears in no distress. Integumentary (Hair, Skin) Skin is dry. Likely some degree of chronic skin damage from sun exposure. Notes Woman exam; she hasn't had any open area here and she is fully epithelialized. She has no major edema Electronic Signature(s) Signed:  09/15/2018 5:32:05 PM By: Linton Ham MD Entered By: Linton Ham on 09/15/2018 13:44:15 Cathy Hines ([id number]) -------------------------------------------------------------------------------- Physician Orders Details Patient Name: Cathy Hines Date of Service: 09/15/2018 11:00 AM Medical Record Number: [id number] Patient Account Number: [id number] Date of Birth/Sex: 06/17/1946 (72 y.o. F) Treating RN: Cornell Barman Primary Care Provider: Emily Filbert Other Clinician: Referring Provider: Emily Filbert Treating Provider/Extender: Tito Dine in Treatment: 9 Verbal / Phone Orders: No Diagnosis Coding Discharge From [facility] Wound #2 [place] Lower Leg o Discharge from [place] Signature(s) Signed: 09/15/2018 5:32:05 PM By: Linton Ham MD Signed: 09/16/2018 7:26:08 AM By: Gretta Cool, BSN, RN, CWS, Kim RN, BSN Entered By: Gretta Cool, BSN, RN, CWS, Kim on 09/15/2018 11:08:51 Cathy Hines ([id number]) -------------------------------------------------------------------------------- Problem List Details Patient Name: Cathy Hines Date of Service: 09/15/2018 11:00 AM Medical Record Number: [id number] Patient Account Number: [id number] Date of Birth/Sex: [birth year] (72 y.o. F) Treating RN: Cornell Barman Primary Care Provider: Emily Filbert Other Clinician: Referring Provider: Emily Filbert Treating Provider/Extender: Tito Dine in Treatment: 9 Active Problems ICD-10 Evaluated Encounter Code Description Active Date Today Diagnosis S81.812D Laceration without foreign body, left lower leg, subsequent 07/14/2018 No Yes encounter E11.622 Type 2 diabetes mellitus with other skin ulcer 07/14/2018 No Yes L97.821 Non-pressure chronic ulcer of other part of left lower leg 07/14/2018 No Yes limited to breakdown of skin Inactive Problems Resolved Problems Electronic Signature(s) Signed: 09/15/2018 5:32:05 PM By: Linton Ham  MD Entered By: Linton Ham on 09/15/2018 13:41:21 [place], [place] ([id number]) -------------------------------------------------------------------------------- Progress  Note Details Patient Name: Cathy Hines, Cathy Hines Date of Service: 09/15/2018 11:00 AM Medical Record Number: [id number] Patient Account Number: [id number] Date of Birth/Sex: 1946-11-15 (72 y.o. F) Treating RN: Cornell Barman Primary Care Provider: Emily Filbert Other Clinician: Referring Provider: Emily Filbert Treating Provider/Extender: Tito Dine in Treatment: 9 Subjective History of Present Illness (HPI) 04/07/18 ADMISSION This is a 73 year old woman with type 2 diabetes and a history of diabetic neuropathy. About a month ago she fell while walking in her yard and suffered a laceration of her right anterior shin over the tibia. She tried to re-adhere the skin flaps. She went to see her primary physician on 03/11/18 was given antibiotic ointment, Telfa and Coban and was prescribed doxycycline. I believe she had an x-ray ordered that did not show up particular abnormality. On 03/15/18 Keflex was added. Most recently she has been putting Betadine on this and Vaseline and was told to soak this in Epson salts. She does not have a wound history. She has type 2 diabetes with neuropathy, hyperlipidemia hypertension hypothyroidism mitral valve prolapse and atrial fibrillation, osteoarthritis and asthma. We could not test her ABIs on either side because of complaints about pain, even the left side doesn't have a wound 04/14/18; she comes in today telling us that the debridement I did last week caused her to vomit when she got home. We are using Iodoflex to the wound as the primary dressing under Kerlix and Coban. She also had complaints about the wrap the heat etc. 04/21/18; using Iodoflex to this traumatic area on the right anterior shin. Arise with a better looking surface although still a difficult amount of tightly adherent  fibrinous debris. 04/28/18; were using to flex to the traumatic area on her right anterior shin. The wound is contracted. She does not want compression on today. Still some adherent fibrinous debris over the surface of this. 05/05/18; Hydrofera Blue to the traumatic wound on the right anterior shin. The wound is contracting. 05/12/18; been using Hydrofera Blue to the traumatic wound on the right anterior shin this is totally closed today. READMISSION 07/14/18 Patient is here today with a one-week history of traumatic wounds on the left anterior shin area. This occurred while she was lifting sheet metal out of the trunk of her car with a part of it falling on her left anterior ankle. She has 4 open areas all and close juxtaposition. The top 3 are minor. She has a laceration/skin tear inferiorly in the wound area. This is horizontal some surface debris. She is complaining of a lot of pain although none in these wounds look like they should be that painful. She was seen in urgent care after the incident was not given any antibiotics. She was not x-rayed. She has been applying Silvadene that she got from a friend and feels that helped with the pain. she is previously here with a traumatic wound on her right anterior tibial area just 2 months ago. The left leg was too painful to do ABIs on today although it was noncompressible when she was here the last time. Today the right leg is noncompressible 07/21/18; the patient took her left leg wrap off 2 or 3 days ago. Since then she's been using Neosporin. This was only a Kerlix Coban wrap and should not have been causing her that much trouble. She arrives today with nonviable surface eschar requiring debridement 08/04/18; 2 week follow-up. The patient has been using Hydrofera Blue under border foam. She has a listed complaints  however her wound is way down in dimension. She did not tolerate any form of compression but she doesn't have a lot of edema 08/11/18;  one-week follow-up. The patient is doing well using Hydrofera Blue under border foam. Wound is contracting she has Cathy Hines, Cathy L. ([id number]) no complaints. 08/18/18; one-week follow-up. The patient is doing well with Hydrofera Blue under border foam. She states she dropped something on the wound site this week. There doesn't seem to be any adverse effects 08/25/18; Hydrofera Blue under border foam. The vast majority of this wound is closed. Only a small open area remains on the medial part of the horizontal wound 09/01/18; Hydrofera Blue under border foam. Small open area remains refractory. 09/15/18; the patient arrives with the area fully epithelialized and closed.She has a history of trauma in the area. Probably has some degree of chronic venous insufficiency and solar induced skin damage. I would not be able to talk her into any form of compression stocking Objective Constitutional Sitting or standing Blood Pressure is within target range for patient.. Pulse regular and within target range for patient.Marland Kitchen Respirations regular, non-labored and within target range.. Temperature is normal and within the target range for the patient.Marland Kitchen appears in no distress. Vitals Time Taken: 10:56 AM, Height: 59 in, Weight: 160 lbs, BMI: 32.3, Temperature: 98.2 F, Pulse: 70 bpm, Respiratory Rate: 16 breaths/min, Blood Pressure: 108/78 mmHg. General Notes: Woman exam; she hasn't had any open area here and she is fully epithelialized. She has no major edema Integumentary (Hair, Skin) Skin is dry. Likely some degree of chronic skin damage from sun exposure. Wound #2 status is Healed - Epithelialized. Original cause of wound was Trauma. The wound is located on the Left,Anterior Lower Leg. The wound measures 0cm length x 0cm width x 0cm depth; 0cm^2 area and 0cm^3 volume. There is Fat Layer (Subcutaneous Tissue) Exposed exposed. There is no tunneling or undermining noted. There is a none present amount  of drainage noted. The wound margin is flat and intact. There is no granulation within the wound bed. There is a small (1-33%) amount of necrotic tissue within the wound bed including Eschar and Adherent Slough. The periwound skin appearance exhibited: Scarring. The periwound skin appearance did not exhibit: Callus, Crepitus, Excoriation, Induration, Rash, Dry/Scaly, Maceration, Atrophie Blanche, Cyanosis, Ecchymosis, Hemosiderin Staining, Mottled, Pallor, Rubor, Erythema. Periwound temperature was noted as No Abnormality. Assessment Active Problems ICD-10 Laceration without foreign body, left lower leg, subsequent encounter Type 2 diabetes mellitus with other skin ulcer Non-pressure chronic ulcer of other part of left lower leg limited to breakdown of skin Cathy Hines, Cathy L. ([id number]) Plan Discharge From [facility] Services: Wound #2 Left,Anterior Lower Leg: Discharge from [place] #1 the patient to be discharged from the wound care center #2 I suggested support hose if for no other reason than to protect her skin which seems fragile. She would not agree to this. Electronic Signature(s) Signed: 09/15/2018 5:32:05 PM By: Linton Ham MD Entered By: Linton Ham on 09/15/2018 13:44:53 Cathy Hines ([id number]) -------------------------------------------------------------------------------- [place] Details Patient Name: Cathy Hines Date of Service: 09/15/2018 Medical Record Number: [id number] Patient Account Number: [id number] Date of Birth/Sex: 11/10/1946 (72 y.o. F) Treating RN: Cornell Barman Primary Care Provider: Emily Filbert Other Clinician: Referring Provider: Emily Filbert Treating Provider/Extender: Tito Dine in Treatment: 9 Diagnosis Coding ICD-10 Codes Code Description [phone] Laceration without foreign body, left lower leg, subsequent encounter E11.622 Type 2 diabetes mellitus with other skin ulcer L97.821 Non-pressure chronic ulcer of  other part of left lower leg limited to breakdown of skin Facility Procedures CPT4 Code: [id number] Description: [phone] - WOUND CARE VISIT-LEV 2 EST PT Modifier: Quantity: 1 Physician Procedures CPT4 Code Description: [id number] 55974 - WC PHYS LEVEL 2 - EST PT ICD-10 Diagnosis Description L97.821 Non-pressure chronic ulcer of other part of left lower leg limi E11.622 Type 2 diabetes mellitus with other skin ulcer Modifier: ted to breakdown Quantity: 1 of skin Electronic Signature(s) Signed: 09/15/2018 5:32:05 PM By: Linton Ham MD Entered By: Linton Ham on 09/15/2018 13:45:17

## 2018-09-18 NOTE — Progress Notes (Signed)
Cathy Hines ([id number]) Visit Report for 09/15/2018 Arrival Information Details Patient Name: Cathy Hines, Cathy Hines Date of Service: 09/15/2018 11:00 AM Medical Record Number: [id number] Patient Account Number: [id number] Date of Birth/Sex: 05-Oct-1946 (72 y.o. F) Treating RN: Secundino Ginger Primary Care Jaiyanna Safran: Emily Filbert Other Clinician: Referring Dorcas Melito: Emily Filbert Treating Basem Yannuzzi/Extender: Tito Dine in Treatment: 9 Visit Information History Since Last Visit Added or deleted any medications: No Patient Arrived: Ambulatory Any new allergies or adverse reactions: No Arrival Time: 10:55 Had a fall or experienced change in No Accompanied By: self activities of daily living that may affect Transfer Assistance: None risk of falls: Patient Identification Verified: Yes Signs or symptoms of abuse/neglect since last visito No Secondary Verification Process Completed: Yes Hospitalized since last visit: No Patient Has Alerts: Yes Implantable device outside of the clinic excluding No Patient Alerts: DMII cellular tissue based products placed in the center since last visit: Has Dressing in Place as Prescribed: No Pain Present Now: No Electronic Signature(s) Signed: 09/15/2018 3:57:54 PM By: Secundino Ginger Entered By: Secundino Ginger on 09/15/2018 10:56:32 Cathy Hines ([id number]) -------------------------------------------------------------------------------- Clinic Level of Care Assessment Details Patient Name: Cathy Hines Date of Service: 09/15/2018 11:00 AM Medical Record Number: [id number] Patient Account Number: [id number] Date of Birth/Sex: Mar 20, 1946 (72 y.o. F) Treating RN: Cornell Barman Primary Care Jakelin Taussig: Emily Filbert Other Clinician: Referring Phaedra Colgate: Emily Filbert Treating Avynn Klassen/Extender: Tito Dine in Treatment: 9 Clinic Level of Care Assessment Items TOOL 4 Quantity Score []  - Use when only an EandM is performed on FOLLOW-UP visit  0 ASSESSMENTS - Nursing Assessment / Reassessment []  - Reassessment of Co-morbidities (includes updates in patient status) 0 X- 1 5 Reassessment of Adherence to Treatment Plan ASSESSMENTS - Wound and Skin Assessment / Reassessment X - Simple Wound Assessment / Reassessment - one wound 1 5 []  - 0 Complex Wound Assessment / Reassessment - multiple wounds []  - 0 Dermatologic / Skin Assessment (not related to wound area) ASSESSMENTS - Focused Assessment []  - Circumferential Edema Measurements - multi extremities 0 []  - 0 Nutritional Assessment / Counseling / Intervention []  - 0 Lower Extremity Assessment (monofilament, tuning fork, pulses) []  - 0 Peripheral Arterial Disease Assessment (using hand held doppler) ASSESSMENTS - Ostomy and/or Continence Assessment and Care []  - Incontinence Assessment and Management 0 []  - 0 Ostomy Care Assessment and Management (repouching, etc.) PROCESS - Coordination of Care X - Simple Patient / Family Education for ongoing care 1 15 []  - 0 Complex (extensive) Patient / Family Education for ongoing care []  - 0 Staff obtains Programmer, systems, Records, Test Results / Process Orders []  - 0 Staff telephones HHA, Nursing Homes / Clarify orders / etc []  - 0 Routine Transfer to another Facility (non-emergent condition) []  - 0 Routine Hospital Admission (non-emergent condition) []  - 0 New Admissions / Biomedical engineer / Ordering NPWT, Apligraf, etc. []  - 0 Emergency Hospital Admission (emergent condition) X- 1 10 Simple Discharge Coordination Artist, Idalie L. ([id number]) []  - 0 Complex (extensive) Discharge Coordination PROCESS - Special Needs []  - Pediatric / Minor Patient Management 0 []  - 0 Isolation Patient Management []  - 0 Hearing / Language / Visual special needs []  - 0 Assessment of Community assistance (transportation, D/C planning, etc.) []  - 0 Additional assistance / Altered mentation []  - 0 Support Surface(s) Assessment (bed,  cushion, seat, etc.) INTERVENTIONS - Wound Cleansing / Measurement []  - Simple Wound Cleansing - one wound 0 []  - 0 Complex Wound Cleansing - multiple wounds  X- 1 5 Wound Imaging (photographs - any number of wounds) []  - 0 Wound Tracing (instead of photographs) []  - 0 Simple Wound Measurement - one wound []  - 0 Complex Wound Measurement - multiple wounds INTERVENTIONS - Wound Dressings []  - Small Wound Dressing one or multiple wounds 0 []  - 0 Medium Wound Dressing one or multiple wounds []  - 0 Large Wound Dressing one or multiple wounds []  - 0 Application of Medications - topical []  - 0 Application of Medications - injection INTERVENTIONS - Miscellaneous []  - External ear exam 0 []  - 0 Specimen Collection (cultures, biopsies, blood, body fluids, etc.) []  - 0 Specimen(s) / Culture(s) sent or taken to Lab for analysis []  - 0 Patient Transfer (multiple staff / Civil Service fast streamer / Similar devices) []  - 0 Simple Staple / Suture removal (25 or less) []  - 0 Complex Staple / Suture removal (26 or more) []  - 0 Hypo / Hyperglycemic Management (close monitor of Blood Glucose) []  - 0 Ankle / Brachial Index (ABI) - do not check if billed separately []  - 0 Vital Signs Dun, Anuhea L. ([id number]) Has the patient been seen at the hospital within the last three years: Yes Total Score: 40 Level Of Care: New/Established - Level 2 Electronic Signature(s) Signed: 09/16/2018 7:26:08 AM By: Gretta Cool, BSN, RN, CWS, Kim RN, BSN Entered By: Gretta Cool, BSN, RN, CWS, Kim on 09/15/2018 11:09:54 Cathy Hines ([id number]) -------------------------------------------------------------------------------- Encounter Discharge Information Details Patient Name: Cathy Hines Date of Service: 09/15/2018 11:00 AM Medical Record Number: [id number] Patient Account Number: [id number] Date of Birth/Sex: 1946-07-16 (72 y.o. F) Treating RN: Cornell Barman Primary Care Jiovany Scheffel: Emily Filbert Other  Clinician: Referring Destini Cambre: Emily Filbert Treating Dontaye Hur/Extender: Tito Dine in Treatment: 9 Encounter Discharge Information Items Discharge Condition: Stable Ambulatory Status: Ambulatory Discharge Destination: Home Transportation: Private Auto Accompanied By: self Schedule Follow-up Appointment: No Clinical Summary of Care: Electronic Signature(s) Signed: 09/16/2018 7:26:08 AM By: Gretta Cool, BSN, RN, CWS, Kim RN, BSN Entered By: Gretta Cool, BSN, RN, CWS, Kim on 09/15/2018 11:11:05 Cathy Hines ([id number]) -------------------------------------------------------------------------------- Lower Extremity Assessment Details Patient Name: Cathy Hines Date of Service: 09/15/2018 11:00 AM Medical Record Number: [id number] Patient Account Number: [id number] Date of Birth/Sex: 1946-06-09 (72 y.o. F) Treating RN: Secundino Ginger Primary Care Kiannah Grunow: Emily Filbert Other Clinician: Referring Virgin Zellers: Emily Filbert Treating Peyson Delao/Extender: Tito Dine in Treatment: 9 Electronic Signature(s) Signed: 09/15/2018 3:57:54 PM By: Secundino Ginger Entered By: Secundino Ginger on 09/15/2018 11:01:58 Cathy Hines ([id number]) -------------------------------------------------------------------------------- Multi Wound Chart Details Patient Name: Cathy Hines Date of Service: 09/15/2018 11:00 AM Medical Record Number: [id number] Patient Account Number: [id number] Date of Birth/Sex: [birth year] (72 y.o. F) Treating RN: Cornell Barman Primary Care Shawnie Nicole: Emily Filbert Other Clinician: Referring Harrietta Incorvaia: Emily Filbert Treating Hadi Dubin/Extender: Tito Dine in Treatment: 9 Vital Signs Height(in): 59 Pulse(bpm): 70 Weight(lbs): 160 Blood Pressure(mmHg): 108/78 Body Mass Index(BMI): 32 Temperature(F): 98.2 Respiratory Rate 16 (breaths/min): Photos: [N/A:N/A] Wound Location: Left, Anterior Lower Leg N/A N/A Wounding Event: Trauma N/A N/A Primary Etiology:  Trauma, Other N/A N/A Comorbid History: Cataracts, Chronic sinus N/A N/A problems/congestion, Lymphedema, Asthma, Sleep Apnea, Angina, Arrhythmia, Hypertension, Type II Diabetes, Osteoarthritis, Neuropathy Date Acquired: 07/09/2018 N/A N/A Weeks of Treatment: 9 N/A N/A Wound Status: Healed - Epithelialized N/A N/A Measurements L x W x D 0x0x0 N/A N/A (cm) Area (cm) : 0 N/A N/A Volume (cm) : 0 N/A N/A % Reduction in Area: 100.00% N/A N/A % Reduction in Volume: 100.00% N/A  N/A Classification: Partial Thickness N/A N/A Exudate Amount: None Present N/A N/A Wound Margin: Flat and Intact N/A N/A Granulation Amount: None Present (0%) N/A N/A Necrotic Amount: Small (1-33%) N/A N/A Necrotic Tissue: Eschar, Adherent Slough N/A N/A Exposed Structures: Fat Layer (Subcutaneous N/A N/A Tissue) Exposed: Yes Fascia: No Tendon: No Bettcher, Kenzly L. ([id number]) Muscle: No Joint: No Bone: No Epithelialization: Medium (34-66%) N/A N/A Periwound Skin Texture: Scarring: Yes N/A N/A Excoriation: No Induration: No Callus: No Crepitus: No Rash: No Periwound Skin Moisture: Maceration: No N/A N/A Dry/Scaly: No Periwound Skin Color: Atrophie Blanche: No N/A N/A Cyanosis: No Ecchymosis: No Erythema: No Hemosiderin Staining: No Mottled: No Pallor: No Rubor: No Temperature: No Abnormality N/A N/A Tenderness on Palpation: No N/A N/A Treatment Notes Electronic Signature(s) Signed: 09/15/2018 5:32:05 PM By: Linton Ham MD Entered By: Linton Ham on 09/15/2018 13:41:29 Cathy Hines ([id number]) -------------------------------------------------------------------------------- [place] Details Patient Name: Cathy Hines Date of Service: 09/15/2018 11:00 AM Medical Record Number: [id number] Patient Account Number: [id number] Date of Birth/Sex: 1946/07/24 (72 y.o. F) Treating RN: Cornell Barman Primary Care Smith Potenza: Emily Filbert Other Clinician: Referring  Rie Mcneil: Emily Filbert Treating Duaine Radin/Extender: Tito Dine in Treatment: 9 Active Inactive Electronic Signature(s) Signed: 09/16/2018 7:26:08 AM By: Gretta Cool, BSN, RN, CWS, Kim RN, BSN Entered By: Gretta Cool, BSN, RN, CWS, Kim on 09/15/2018 11:08:32 Cathy Hines ([id number]) -------------------------------------------------------------------------------- Pain Assessment Details Patient Name: Cathy Hines Date of Service: 09/15/2018 11:00 AM Medical Record Number: [id number] Patient Account Number: [id number] Date of Birth/Sex: 1946-07-01 (72 y.o. F) Treating RN: Secundino Ginger Primary Care Emslee Lopezmartinez: Emily Filbert Other Clinician: Referring Pebbles Zeiders: Emily Filbert Treating Matelyn Antonelli/Extender: Tito Dine in Treatment: 9 Active Problems Location of Pain Severity and Description of Pain Patient Has Paino No Site Locations Pain Management and Medication Current Pain Management: Goals for Pain Management pt denies any pain at this time. Electronic Signature(s) Signed: 09/15/2018 3:57:54 PM By: Secundino Ginger Entered By: Secundino Ginger on 09/15/2018 10:56:53 Cathy Hines ([id number]) -------------------------------------------------------------------------------- Patient/Caregiver Education Details Patient Name: Cathy Hines Date of Service: 09/15/2018 11:00 AM Medical Record Number: [id number] Patient Account Number: [id number] Date of Birth/Gender: Oct 22, 1946 (72 y.o. F) Treating RN: Cornell Barman Primary Care Physician: Emily Filbert Other Clinician: Referring Physician: Emily Filbert Treating Physician/Extender: Tito Dine in Treatment: 9 Education Assessment Education Provided To: Patient Education Topics Provided Wound/Skin Impairment: Handouts: Other: lotion on legs daily Methods: Demonstration, Explain/Verbal Responses: State content correctly Electronic Signature(s) Signed: 09/16/2018 7:26:08 AM By: Gretta Cool, BSN, RN, CWS, Kim RN,  BSN Entered By: Gretta Cool, BSN, RN, CWS, Kim on 09/15/2018 11:10:24 Cathy Hines ([id number]) -------------------------------------------------------------------------------- Wound Assessment Details Patient Name: Cathy Hines Date of Service: 09/15/2018 11:00 AM Medical Record Number: [id number] Patient Account Number: [id number] Date of Birth/Sex: 1946-02-01 (72 y.o. F) Treating RN: Cornell Barman Primary Care Alain Deschene: Emily Filbert Other Clinician: Referring Yaniah Thiemann: Emily Filbert Treating Castulo Scarpelli/Extender: Tito Dine in Treatment: 9 Wound Status Wound Number: 2 Primary Trauma, Other Etiology: Wound Location: Left, Anterior Lower Leg Wound Healed - Epithelialized Wounding Event: Trauma Status: Date Acquired: 07/09/2018 Comorbid Cataracts, Chronic sinus problems/congestion, Weeks Of Treatment: 9 History: Lymphedema, Asthma, Sleep Apnea, Angina, Clustered Wound: No Arrhythmia, Hypertension, Type II Diabetes, Osteoarthritis, Neuropathy Photos Photo Uploaded By: Secundino Ginger on 09/15/2018 11:31:49 Wound Measurements Length: (cm) 0 % Red Width: (cm) 0 % Red Depth: (cm) 0 Epith Area: (cm) 0 Tunn Volume: (cm) 0 Unde uction in Area: 100% uction in Volume: 100% elialization: Medium (  34-66%) eling: No rmining: No Wound Description Classification: Partial Thickness Wound Margin: Flat and Intact Exudate Amount: None Present Foul Odor After Cleansing: No Slough/Fibrino No Wound Bed Granulation Amount: None Present (0%) Exposed Structure Necrotic Amount: Small (1-33%) Fascia Exposed: No Necrotic Quality: Eschar, Adherent Slough Fat Layer (Subcutaneous Tissue) Exposed: Yes Tendon Exposed: No Muscle Exposed: No Joint Exposed: No Bone Exposed: No Periwound Skin Texture Texture Color Grudzinski, Dejae L. ([id number]) No Abnormalities Noted: No No Abnormalities Noted: No Callus: No Atrophie Blanche: No Crepitus: No Cyanosis: No Excoriation: No Ecchymosis:  No Induration: No Erythema: No Rash: No Hemosiderin Staining: No Scarring: Yes Mottled: No Pallor: No Moisture Rubor: No No Abnormalities Noted: No Dry / Scaly: No Temperature / Pain Maceration: No Temperature: No Abnormality Electronic Signature(s) Signed: 09/16/2018 7:26:08 AM By: Gretta Cool, BSN, RN, CWS, Kim RN, BSN Entered By: Gretta Cool, BSN, RN, CWS, Kim on 09/15/2018 11:10:45 Cathy Hines ([id number]) -------------------------------------------------------------------------------- Vitals Details Patient Name: Cathy Hines Date of Service: 09/15/2018 11:00 AM Medical Record Number: [id number] Patient Account Number: [id number] Date of Birth/Sex: 1945/12/29 (72 y.o. F) Treating RN: Secundino Ginger Primary Care Bookert Guzzi: Emily Filbert Other Clinician: Referring Lachell Rochette: Emily Filbert Treating Kasheena Sambrano/Extender: Tito Dine in Treatment: 9 Vital Signs Time Taken: 10:56 Temperature (F): 98.2 Height (in): 59 Pulse (bpm): 70 Weight (lbs): 160 Respiratory Rate (breaths/min): 16 Body Mass Index (BMI): 32.3 Blood Pressure (mmHg): 108/78 Reference Range: 80 - 120 mg / dl Electronic Signature(s) Signed: 09/15/2018 3:57:54 PM By: Secundino Ginger Entered By: Secundino Ginger on 09/15/2018 10:57:30

## 2018-09-24 DIAGNOSIS — E119 Type 2 diabetes mellitus without complications: Secondary | ICD-10-CM | POA: Diagnosis not present

## 2018-11-29 DIAGNOSIS — R6889 Other general symptoms and signs: Secondary | ICD-10-CM | POA: Diagnosis not present

## 2018-11-29 DIAGNOSIS — E119 Type 2 diabetes mellitus without complications: Secondary | ICD-10-CM | POA: Diagnosis not present

## 2018-11-29 DIAGNOSIS — R05 Cough: Secondary | ICD-10-CM | POA: Diagnosis not present

## 2018-11-29 DIAGNOSIS — I10 Essential (primary) hypertension: Secondary | ICD-10-CM | POA: Diagnosis not present

## 2018-12-15 DIAGNOSIS — E119 Type 2 diabetes mellitus without complications: Secondary | ICD-10-CM | POA: Diagnosis not present

## 2018-12-15 DIAGNOSIS — I2510 Atherosclerotic heart disease of native coronary artery without angina pectoris: Secondary | ICD-10-CM | POA: Diagnosis not present

## 2018-12-15 DIAGNOSIS — Z0000 Encounter for general adult medical examination without abnormal findings: Secondary | ICD-10-CM | POA: Diagnosis not present

## 2019-01-03 DIAGNOSIS — K56609 Unspecified intestinal obstruction, unspecified as to partial versus complete obstruction: Secondary | ICD-10-CM | POA: Diagnosis not present

## 2019-01-03 DIAGNOSIS — I10 Essential (primary) hypertension: Secondary | ICD-10-CM | POA: Diagnosis not present

## 2019-01-03 DIAGNOSIS — E039 Hypothyroidism, unspecified: Secondary | ICD-10-CM | POA: Diagnosis present

## 2019-01-03 DIAGNOSIS — Z7984 Long term (current) use of oral hypoglycemic drugs: Secondary | ICD-10-CM | POA: Diagnosis not present

## 2019-01-03 DIAGNOSIS — N189 Chronic kidney disease, unspecified: Secondary | ICD-10-CM | POA: Diagnosis present

## 2019-01-03 DIAGNOSIS — Z90710 Acquired absence of both cervix and uterus: Secondary | ICD-10-CM | POA: Diagnosis not present

## 2019-01-03 DIAGNOSIS — Z85828 Personal history of other malignant neoplasm of skin: Secondary | ICD-10-CM | POA: Diagnosis not present

## 2019-01-03 DIAGNOSIS — M7989 Other specified soft tissue disorders: Secondary | ICD-10-CM | POA: Diagnosis not present

## 2019-01-03 DIAGNOSIS — K5651 Intestinal adhesions [bands], with partial obstruction: Principal | ICD-10-CM | POA: Diagnosis present

## 2019-01-03 DIAGNOSIS — Z79899 Other long term (current) drug therapy: Secondary | ICD-10-CM | POA: Diagnosis not present

## 2019-01-03 DIAGNOSIS — E876 Hypokalemia: Secondary | ICD-10-CM | POA: Diagnosis present

## 2019-01-03 DIAGNOSIS — N390 Urinary tract infection, site not specified: Secondary | ICD-10-CM | POA: Diagnosis not present

## 2019-01-03 DIAGNOSIS — I129 Hypertensive chronic kidney disease with stage 1 through stage 4 chronic kidney disease, or unspecified chronic kidney disease: Secondary | ICD-10-CM | POA: Diagnosis present

## 2019-01-03 DIAGNOSIS — Z79890 Hormone replacement therapy (postmenopausal): Secondary | ICD-10-CM | POA: Diagnosis not present

## 2019-01-03 DIAGNOSIS — I2510 Atherosclerotic heart disease of native coronary artery without angina pectoris: Secondary | ICD-10-CM | POA: Diagnosis present

## 2019-01-03 DIAGNOSIS — E7800 Pure hypercholesterolemia, unspecified: Secondary | ICD-10-CM | POA: Diagnosis present

## 2019-01-03 DIAGNOSIS — R9431 Abnormal electrocardiogram [ECG] [EKG]: Secondary | ICD-10-CM | POA: Diagnosis not present

## 2019-01-03 DIAGNOSIS — F0390 Unspecified dementia without behavioral disturbance: Secondary | ICD-10-CM | POA: Diagnosis present

## 2019-01-03 DIAGNOSIS — Z87891 Personal history of nicotine dependence: Secondary | ICD-10-CM

## 2019-01-03 DIAGNOSIS — E1151 Type 2 diabetes mellitus with diabetic peripheral angiopathy without gangrene: Secondary | ICD-10-CM | POA: Diagnosis present

## 2019-01-03 DIAGNOSIS — Z955 Presence of coronary angioplasty implant and graft: Secondary | ICD-10-CM

## 2019-01-03 DIAGNOSIS — R1030 Lower abdominal pain, unspecified: Secondary | ICD-10-CM | POA: Diagnosis not present

## 2019-01-03 DIAGNOSIS — K5652 Intestinal adhesions [bands] with complete obstruction: Secondary | ICD-10-CM | POA: Diagnosis not present

## 2019-01-03 DIAGNOSIS — E1122 Type 2 diabetes mellitus with diabetic chronic kidney disease: Secondary | ICD-10-CM | POA: Diagnosis present

## 2019-01-03 DIAGNOSIS — Z8673 Personal history of transient ischemic attack (TIA), and cerebral infarction without residual deficits: Secondary | ICD-10-CM | POA: Diagnosis not present

## 2019-01-03 DIAGNOSIS — K219 Gastro-esophageal reflux disease without esophagitis: Secondary | ICD-10-CM | POA: Diagnosis present

## 2019-01-03 DIAGNOSIS — R112 Nausea with vomiting, unspecified: Secondary | ICD-10-CM | POA: Diagnosis not present

## 2019-01-03 DIAGNOSIS — K56699 Other intestinal obstruction unspecified as to partial versus complete obstruction: Secondary | ICD-10-CM | POA: Diagnosis not present

## 2019-01-03 DIAGNOSIS — J449 Chronic obstructive pulmonary disease, unspecified: Secondary | ICD-10-CM | POA: Diagnosis present

## 2019-01-03 DIAGNOSIS — G2581 Restless legs syndrome: Secondary | ICD-10-CM | POA: Diagnosis present

## 2019-01-03 DIAGNOSIS — Z4682 Encounter for fitting and adjustment of non-vascular catheter: Secondary | ICD-10-CM | POA: Diagnosis not present

## 2019-01-04 ENCOUNTER — Other Ambulatory Visit: Payer: Self-pay

## 2019-01-04 ENCOUNTER — Emergency Department: Payer: Medicare HMO

## 2019-01-04 ENCOUNTER — Inpatient Hospital Stay
Admission: EM | Admit: 2019-01-04 | Discharge: 2019-01-08 | DRG: 389 | Disposition: A | Discharge status: Home / Self Care | Payer: Medicare HMO | Attending: Internal Medicine | Admitting: Internal Medicine

## 2019-01-04 ENCOUNTER — Inpatient Hospital Stay: Payer: Medicare HMO

## 2019-01-04 ENCOUNTER — Encounter: Payer: Self-pay | Admitting: *Deleted

## 2019-01-04 DIAGNOSIS — R112 Nausea with vomiting, unspecified: Secondary | ICD-10-CM | POA: Diagnosis present

## 2019-01-04 DIAGNOSIS — K5651 Intestinal adhesions [bands], with partial obstruction: Secondary | ICD-10-CM | POA: Diagnosis present

## 2019-01-04 DIAGNOSIS — K56609 Unspecified intestinal obstruction, unspecified as to partial versus complete obstruction: Secondary | ICD-10-CM

## 2019-01-04 DIAGNOSIS — Z955 Presence of coronary angioplasty implant and graft: Secondary | ICD-10-CM | POA: Diagnosis not present

## 2019-01-04 DIAGNOSIS — Z87891 Personal history of nicotine dependence: Secondary | ICD-10-CM | POA: Diagnosis not present

## 2019-01-04 DIAGNOSIS — E7800 Pure hypercholesterolemia, unspecified: Secondary | ICD-10-CM | POA: Diagnosis present

## 2019-01-04 DIAGNOSIS — I129 Hypertensive chronic kidney disease with stage 1 through stage 4 chronic kidney disease, or unspecified chronic kidney disease: Secondary | ICD-10-CM | POA: Diagnosis present

## 2019-01-04 DIAGNOSIS — R609 Edema, unspecified: Secondary | ICD-10-CM

## 2019-01-04 DIAGNOSIS — Z85828 Personal history of other malignant neoplasm of skin: Secondary | ICD-10-CM | POA: Diagnosis not present

## 2019-01-04 DIAGNOSIS — I2510 Atherosclerotic heart disease of native coronary artery without angina pectoris: Secondary | ICD-10-CM | POA: Diagnosis present

## 2019-01-04 DIAGNOSIS — E876 Hypokalemia: Secondary | ICD-10-CM | POA: Diagnosis present

## 2019-01-04 DIAGNOSIS — Z79890 Hormone replacement therapy (postmenopausal): Secondary | ICD-10-CM | POA: Diagnosis not present

## 2019-01-04 DIAGNOSIS — F0390 Unspecified dementia without behavioral disturbance: Secondary | ICD-10-CM | POA: Diagnosis present

## 2019-01-04 DIAGNOSIS — J449 Chronic obstructive pulmonary disease, unspecified: Secondary | ICD-10-CM | POA: Diagnosis present

## 2019-01-04 DIAGNOSIS — N189 Chronic kidney disease, unspecified: Secondary | ICD-10-CM | POA: Diagnosis present

## 2019-01-04 DIAGNOSIS — Z7984 Long term (current) use of oral hypoglycemic drugs: Secondary | ICD-10-CM | POA: Diagnosis not present

## 2019-01-04 DIAGNOSIS — N390 Urinary tract infection, site not specified: Secondary | ICD-10-CM | POA: Diagnosis present

## 2019-01-04 DIAGNOSIS — Z8673 Personal history of transient ischemic attack (TIA), and cerebral infarction without residual deficits: Secondary | ICD-10-CM | POA: Diagnosis not present

## 2019-01-04 DIAGNOSIS — E039 Hypothyroidism, unspecified: Secondary | ICD-10-CM | POA: Diagnosis present

## 2019-01-04 DIAGNOSIS — E1122 Type 2 diabetes mellitus with diabetic chronic kidney disease: Secondary | ICD-10-CM | POA: Diagnosis present

## 2019-01-04 DIAGNOSIS — Z4659 Encounter for fitting and adjustment of other gastrointestinal appliance and device: Secondary | ICD-10-CM

## 2019-01-04 DIAGNOSIS — K219 Gastro-esophageal reflux disease without esophagitis: Secondary | ICD-10-CM | POA: Diagnosis present

## 2019-01-04 DIAGNOSIS — K5652 Intestinal adhesions [bands] with complete obstruction: Secondary | ICD-10-CM

## 2019-01-04 DIAGNOSIS — E1151 Type 2 diabetes mellitus with diabetic peripheral angiopathy without gangrene: Secondary | ICD-10-CM | POA: Diagnosis present

## 2019-01-04 DIAGNOSIS — G2581 Restless legs syndrome: Secondary | ICD-10-CM | POA: Diagnosis present

## 2019-01-04 DIAGNOSIS — Z79899 Other long term (current) drug therapy: Secondary | ICD-10-CM | POA: Diagnosis not present

## 2019-01-04 DIAGNOSIS — Z90710 Acquired absence of both cervix and uterus: Secondary | ICD-10-CM | POA: Diagnosis not present

## 2019-01-04 LAB — CBC
HCT: 40.7 % (ref 36.0–46.0)
HCT: 45.4 % (ref 36.0–46.0)
Hemoglobin: 13.3 g/dL (ref 12.0–15.0)
Hemoglobin: 14.9 g/dL (ref 12.0–15.0)
MCH: 28.4 pg (ref 26.0–34.0)
MCH: 28.7 pg (ref 26.0–34.0)
MCHC: 32.7 g/dL (ref 30.0–36.0)
MCHC: 32.8 g/dL (ref 30.0–36.0)
MCV: 87 fL (ref 80.0–100.0)
MCV: 87.5 fL (ref 80.0–100.0)
NRBC: 0 % (ref 0.0–0.2)
Platelets: 245 10*3/uL (ref 150–400)
Platelets: 253 10*3/uL (ref 150–400)
RBC: 4.68 MIL/uL (ref 3.87–5.11)
RBC: 5.19 MIL/uL — ABNORMAL HIGH (ref 3.87–5.11)
RDW: 13.2 % (ref 11.5–15.5)
RDW: 13.2 % (ref 11.5–15.5)
WBC: 12 10*3/uL — ABNORMAL HIGH (ref 4.0–10.5)
WBC: 14.5 10*3/uL — ABNORMAL HIGH (ref 4.0–10.5)
nRBC: 0 % (ref 0.0–0.2)

## 2019-01-04 LAB — URINALYSIS, COMPLETE (UACMP) WITH MICROSCOPIC
Bilirubin Urine: NEGATIVE
GLUCOSE, UA: NEGATIVE mg/dL
Ketones, ur: NEGATIVE mg/dL
Nitrite: NEGATIVE
PROTEIN: NEGATIVE mg/dL
Specific Gravity, Urine: 1.017 (ref 1.005–1.030)
WBC, UA: >50 WBC/hpf — ABNORMAL HIGH (ref 0–5)
pH: 5 (ref 5.0–8.0)

## 2019-01-04 LAB — COMPREHENSIVE METABOLIC PANEL
ALT: 25 U/L (ref 0–44)
ALT: 30 U/L (ref 0–44)
AST: 26 U/L (ref 15–41)
AST: 49 U/L — AB (ref 15–41)
Albumin: 3.8 g/dL (ref 3.5–5.0)
Albumin: 4.5 g/dL (ref 3.5–5.0)
Alkaline Phosphatase: 74 U/L (ref 38–126)
Alkaline Phosphatase: 81 U/L (ref 38–126)
Anion gap: 6 (ref 5–15)
Anion gap: 9 (ref 5–15)
BUN: 10 mg/dL (ref 8–23)
BUN: 10 mg/dL (ref 8–23)
CALCIUM: 9.5 mg/dL (ref 8.9–10.3)
CO2: 23 mmol/L (ref 22–32)
CO2: 24 mmol/L (ref 22–32)
CREATININE: 1.04 mg/dL — AB (ref 0.44–1.00)
Calcium: 9 mg/dL (ref 8.9–10.3)
Chloride: 108 mmol/L (ref 98–111)
Chloride: 108 mmol/L (ref 98–111)
Creatinine, Ser: 1.01 mg/dL — ABNORMAL HIGH (ref 0.44–1.00)
GFR calc Af Amer: >60 mL/min (ref >60)
GFR calc non Af Amer: 55 mL/min — ABNORMAL LOW (ref >60)
GFR, EST NON AFRICAN AMERICAN: 53 mL/min — AB (ref >60)
Glucose, Bld: 149 mg/dL — ABNORMAL HIGH (ref 70–99)
Glucose, Bld: 156 mg/dL — ABNORMAL HIGH (ref 70–99)
Potassium: 3.5 mmol/L (ref 3.5–5.1)
Potassium: 3.8 mmol/L (ref 3.5–5.1)
SODIUM: 138 mmol/L (ref 135–145)
Sodium: 140 mmol/L (ref 135–145)
Total Bilirubin: 1 mg/dL (ref 0.3–1.2)
Total Bilirubin: 1.1 mg/dL (ref 0.3–1.2)
Total Protein: 7.3 g/dL (ref 6.5–8.1)
Total Protein: 8 g/dL (ref 6.5–8.1)

## 2019-01-04 LAB — APTT: aPTT: 32 seconds (ref 24–36)

## 2019-01-04 LAB — GLUCOSE, CAPILLARY
GLUCOSE-CAPILLARY: 87 mg/dL (ref 70–99)
Glucose-Capillary: 95 mg/dL (ref 70–99)
Glucose-Capillary: 84 mg/dL (ref 70–99)
Glucose-Capillary: 90 mg/dL (ref 70–99)

## 2019-01-04 LAB — PROTIME-INR
INR: 1.06
Prothrombin Time: 13.7 seconds (ref 11.4–15.2)

## 2019-01-04 LAB — TSH: TSH: 1.593 u[IU]/mL (ref 0.350–4.500)

## 2019-01-04 LAB — LACTIC ACID, PLASMA
Lactic Acid, Venous: 1.2 mmol/L (ref 0.5–1.9)
Lactic Acid, Venous: 1.9 mmol/L (ref 0.5–1.9)

## 2019-01-04 LAB — LIPASE, BLOOD: Lipase: 29 U/L (ref 11–51)

## 2019-01-04 MED ORDER — ONDANSETRON HCL 4 MG/2ML IJ SOLN
4.0000 mg | Freq: Four times a day (QID) | INTRAMUSCULAR | Status: DC | PRN
  Administered 2019-01-04 – 2019-01-05 (×2): 4 mg via INTRAVENOUS
  Filled 2019-01-04 (×2): qty 2

## 2019-01-04 MED ORDER — ALBUTEROL SULFATE (2.5 MG/3ML) 0.083% IN NEBU: 3.0000 mL | INHALATION_SOLUTION | Freq: Four times a day (QID) | RESPIRATORY_TRACT | Status: DC | PRN

## 2019-01-04 MED ORDER — PHENOL 1.4 % MT LIQD
1.0000 | OROMUCOSAL | Status: DC | PRN
  Filled 2019-01-04: qty 177

## 2019-01-04 MED ORDER — ACYCLOVIR 200 MG PO CAPS
200.0000 mg | ORAL_CAPSULE | Freq: Every day | ORAL | Status: DC
  Administered 2019-01-04 – 2019-01-08 (×21): 200 mg via ORAL
  Filled 2019-01-04 (×25): qty 1

## 2019-01-04 MED ORDER — MORPHINE SULFATE (PF) 2 MG/ML IV SOLN
2.0000 mg | Freq: Once | INTRAVENOUS | Status: AC
  Administered 2019-01-04: 2 mg via INTRAVENOUS
  Filled 2019-01-04: qty 1

## 2019-01-04 MED ORDER — SODIUM CHLORIDE 0.9 % IV SOLN
1.0000 g | Freq: Two times a day (BID) | INTRAVENOUS | Status: DC
  Administered 2019-01-04 – 2019-01-07 (×6): 1 g via INTRAVENOUS
  Filled 2019-01-04 (×7): qty 1

## 2019-01-04 MED ORDER — SODIUM CHLORIDE 0.9% FLUSH
3.0000 mL | Freq: Two times a day (BID) | INTRAVENOUS | Status: DC
  Administered 2019-01-05: 3 mL via INTRAVENOUS

## 2019-01-04 MED ORDER — AMLODIPINE BESYLATE 5 MG PO TABS
5.0000 mg | ORAL_TABLET | Freq: Every day | ORAL | Status: DC
  Administered 2019-01-04 – 2019-01-08 (×5): 5 mg via ORAL
  Filled 2019-01-04 (×5): qty 1

## 2019-01-04 MED ORDER — HEPARIN SODIUM (PORCINE) 5000 UNIT/ML IJ SOLN
5000.0000 [IU] | Freq: Two times a day (BID) | INTRAMUSCULAR | Status: DC
  Administered 2019-01-04 – 2019-01-08 (×9): 5000 [IU] via SUBCUTANEOUS
  Filled 2019-01-04 (×9): qty 1

## 2019-01-04 MED ORDER — ACETAMINOPHEN 325 MG PO TABS
650.0000 mg | ORAL_TABLET | Freq: Four times a day (QID) | ORAL | Status: DC | PRN
  Administered 2019-01-05 – 2019-01-06 (×2): 650 mg via ORAL
  Filled 2019-01-04 (×3): qty 2

## 2019-01-04 MED ORDER — INSULIN ASPART 100 UNIT/ML ~~LOC~~ SOLN
0.0000 [IU] | Freq: Three times a day (TID) | SUBCUTANEOUS | Status: DC
  Administered 2019-01-07: 1 [IU] via SUBCUTANEOUS
  Administered 2019-01-07: 2 [IU] via SUBCUTANEOUS
  Administered 2019-01-08: 1 [IU] via SUBCUTANEOUS
  Filled 2019-01-04 (×3): qty 1

## 2019-01-04 MED ORDER — IOHEXOL 300 MG/ML  SOLN
100.0000 mL | Freq: Once | INTRAMUSCULAR | Status: AC | PRN
  Administered 2019-01-04: 100 mL via INTRAVENOUS

## 2019-01-04 MED ORDER — PANTOPRAZOLE SODIUM 40 MG IV SOLR
40.0000 mg | INTRAVENOUS | Status: DC
  Administered 2019-01-04 – 2019-01-07 (×4): 40 mg via INTRAVENOUS
  Filled 2019-01-04 (×4): qty 40

## 2019-01-04 MED ORDER — INSULIN ASPART 100 UNIT/ML ~~LOC~~ SOLN: 0.0000 [IU] | Freq: Every day | SUBCUTANEOUS | Status: DC

## 2019-01-04 MED ORDER — LEVOTHYROXINE SODIUM 50 MCG PO TABS
75.0000 ug | ORAL_TABLET | Freq: Every day | ORAL | Status: DC
  Administered 2019-01-04 – 2019-01-08 (×5): 75 ug via ORAL
  Filled 2019-01-04 (×5): qty 2

## 2019-01-04 MED ORDER — SODIUM CHLORIDE 0.9 % IV SOLN
1.0000 g | Freq: Once | INTRAVENOUS | Status: AC
  Administered 2019-01-04: 1 g via INTRAVENOUS
  Filled 2019-01-04: qty 1

## 2019-01-04 MED ORDER — SODIUM CHLORIDE 0.9 % IV SOLN
INTRAVENOUS | Status: DC
  Administered 2019-01-04 – 2019-01-06 (×5): via INTRAVENOUS

## 2019-01-04 MED ORDER — HYDRALAZINE HCL 20 MG/ML IJ SOLN
5.0000 mg | Freq: Four times a day (QID) | INTRAMUSCULAR | Status: DC | PRN
  Administered 2019-01-05 – 2019-01-06 (×2): 5 mg via INTRAVENOUS
  Filled 2019-01-04 (×2): qty 1

## 2019-01-04 MED ORDER — ACETAMINOPHEN 650 MG RE SUPP: 650.0000 mg | Freq: Four times a day (QID) | RECTAL | Status: DC | PRN

## 2019-01-04 MED ORDER — SODIUM CHLORIDE 0.9 % IV BOLUS
500.0000 mL | Freq: Once | INTRAVENOUS | Status: AC
  Administered 2019-01-04: 500 mL via INTRAVENOUS

## 2019-01-04 MED ORDER — METOPROLOL SUCCINATE ER 25 MG PO TB24
25.0000 mg | ORAL_TABLET | Freq: Every day | ORAL | Status: DC
  Administered 2019-01-04 – 2019-01-06 (×3): 25 mg via ORAL
  Filled 2019-01-04 (×3): qty 1

## 2019-01-04 MED ORDER — PRAMIPEXOLE DIHYDROCHLORIDE 0.25 MG PO TABS
0.1250 mg | ORAL_TABLET | Freq: Three times a day (TID) | ORAL | Status: DC
  Administered 2019-01-04 – 2019-01-08 (×13): 0.125 mg via ORAL
  Filled 2019-01-04 (×13): qty 1

## 2019-01-04 MED ORDER — ORAL CARE MOUTH RINSE
15.0000 mL | Freq: Two times a day (BID) | OROMUCOSAL | Status: DC
  Administered 2019-01-04 – 2019-01-07 (×7): 15 mL via OROMUCOSAL

## 2019-01-04 MED ORDER — SODIUM CHLORIDE 0.9% FLUSH
3.0000 mL | Freq: Once | INTRAVENOUS | Status: AC
  Administered 2019-01-04: 3 mL via INTRAVENOUS

## 2019-01-04 MED ORDER — ONDANSETRON HCL 4 MG PO TABS: 4.0000 mg | ORAL_TABLET | Freq: Four times a day (QID) | ORAL | Status: DC | PRN

## 2019-01-04 MED ORDER — ONDANSETRON HCL 4 MG/2ML IJ SOLN
4.0000 mg | Freq: Once | INTRAMUSCULAR | Status: AC
  Administered 2019-01-04: 4 mg via INTRAVENOUS
  Filled 2019-01-04: qty 2

## 2019-01-04 MED ORDER — LIDOCAINE VISCOUS HCL 2 % MT SOLN
15.0000 mL | Freq: Once | OROMUCOSAL | Status: AC
  Administered 2019-01-04: 15 mL via OROMUCOSAL
  Filled 2019-01-04: qty 15

## 2019-01-04 NOTE — ED Triage Notes (Signed)
Pt to triage via wheelchair.  Pt reports abd pain.  Pt states vomiting tonight.  No diarrhea.  Pt alert  Speech clear.

## 2019-01-04 NOTE — ED Notes (Signed)
ED TO INPATIENT HANDOFF REPORT  Name/Age/Gender Cathy Hines 73 y.o. female  Code Status Full  Home/SNF/Other Home  Chief Complaint Back and abdominal pain  Level of Care/Admitting Diagnosis ED Disposition    ED Disposition Condition [place]: [place] [100120]  Level of Care: Med-Surg [16]  Diagnosis: UTI (urinary tract infection) [614431]  Admitting Physician: Sedalia Muta [[id number]]  Attending Physician: Sedalia Muta [[id number]]  Estimated length of stay: 3 - 4 days  Certification:: I certify this patient will need inpatient services for at least 2 midnights  PT Class (Do Not Modify): Inpatient [101]  PT Acc Code (Do Not Modify): Private [1]       Medical History Past Medical History:  Diagnosis Date  . Arthritis    osteo  . Asthma    wheezing  . Bell's palsy    H/O  . Cancer ([place])    skin cancer on face and right arm- 2013  . Cellulitis    left shin  . Complication of anesthesia    woke up during one surgery  . Coronary artery disease    stent  . Cough    chronic  . CVA (cerebral vascular accident) ([place])   . Dementia ([place])    very mild  . Depression    distant H/O  . Diabetes mellitus without complication ([place])    type 2  . Diverticulitis    OSIS  . Gastritis   . GERD (gastroesophageal reflux disease)   . History of hiatal hernia   . Hypercholesterolemia   . Hypertension    on HTN meds  . Hypothyroidism   . Liver cyst   . Memory loss   . MVP (mitral valve prolapse)   . Pneumonia    in past  . Renal insufficiency    horseshoe kidney  . Sleep apnea    no CPAP  . Vertigo    dizziness  . Wears dentures    full upper and lower    Allergies Allergies  Allergen Reactions  . Ace Inhibitors Cough  . Amoxicillin     Pt doesn't remember  . Flagyl [Metronidazole] Other (See Comments)    Pt cannot remember  . Gabapentin     headache  . Milk-Related Compounds Nausea And Vomiting  .  Namenda [Memantine Hcl]     Pt doesn't remember  . Statins     Pt doesn't remember  . Sulfa Antibiotics Swelling  . Verapamil     Pt doesn't remember    IV Location/Drains/Wounds Patient Lines/Drains/Airways Status   Active Line/Drains/Airways    Name:   Placement date:   Placement time:   Site:   Days:   Peripheral IV 01/04/19 Left Antecubital   01/04/19    0232    Antecubital   less than 1   Incision (Closed) 06/22/18 Eye Left   06/22/18    0955     196   Incision (Closed) 06/22/18 Eye Right   06/22/18    0955     196          Labs/Imaging Results for orders placed or performed during the hospital encounter of 01/04/19 (from the past 48 hour(s))  Lipase, blood     Status: None   Collection Time: 01/04/19 12:09 AM  Result Value Ref Range   Lipase 29 11 - 51 U/L    Comment: Performed at [facility], [address] [place], [place] [address]  Comprehensive  metabolic panel     Status: Abnormal   Collection Time: 01/04/19 12:09 AM  Result Value Ref Range   Sodium 140 135 - 145 mmol/L   Potassium 3.5 3.5 - 5.1 mmol/L   Chloride 108 98 - 111 mmol/L   CO2 23 22 - 32 mmol/L   Glucose, Bld 156 (H) 70 - 99 mg/dL   BUN 10 8 - 23 mg/dL   Creatinine, Ser 1.04 (H) 0.44 - 1.00 mg/dL   Calcium 9.5 8.9 - 10.3 mg/dL   Total Protein 8.0 6.5 - 8.1 g/dL   Albumin 4.5 3.5 - 5.0 g/dL   AST 26 15 - 41 U/L   ALT 25 0 - 44 U/L   Alkaline Phosphatase 81 38 - 126 U/L   Total Bilirubin 1.0 0.3 - 1.2 mg/dL   GFR calc non Af Amer 53 (L) >60 mL/min   GFR calc Af Amer >60 >60 mL/min   Anion gap 9 5 - 15    Comment: Performed at [facility], [address] [place], [place] [address]  CBC     Status: Abnormal   Collection Time: 01/04/19 12:09 AM  Result Value Ref Range   WBC 14.5 (H) 4.0 - 10.5 K/uL   RBC 5.19 (H) 3.87 - 5.11 MIL/uL   Hemoglobin 14.9 12.0 - 15.0 g/dL   HCT 45.4 36.0 - 46.0 %   MCV 87.5 80.0 - 100.0 fL   MCH 28.7 26.0 - 34.0 pg   MCHC 32.8 30.0 - 36.0  g/dL   RDW 13.2 11.5 - 15.5 %   Platelets 253 150 - 400 K/uL   nRBC 0.0 0.0 - 0.2 %    Comment: Performed at [facility], [address] [place], [place] [address]  Urinalysis, Complete w Microscopic     Status: Abnormal   Collection Time: 01/04/19 12:09 AM  Result Value Ref Range   Color, Urine YELLOW (A) YELLOW   APPearance CLOUDY (A) CLEAR   Specific Gravity, Urine 1.017 1.005 - 1.030   pH 5.0 5.0 - 8.0   Glucose, UA NEGATIVE NEGATIVE mg/dL   Hgb urine dipstick SMALL (A) NEGATIVE   Bilirubin Urine NEGATIVE NEGATIVE   Ketones, ur NEGATIVE NEGATIVE mg/dL   Protein, ur NEGATIVE NEGATIVE mg/dL   Nitrite NEGATIVE NEGATIVE   Leukocytes,Ua LARGE (A) NEGATIVE   RBC / HPF 6-10 0 - 5 RBC/hpf   WBC, UA >50 (H) 0 - 5 WBC/hpf   Bacteria, UA RARE (A) NONE SEEN   Squamous Epithelial / LPF 21-50 0 - 5   Mucus PRESENT    Hyaline Casts, UA PRESENT    Non Squamous Epithelial PRESENT (A) NONE SEEN    Comment: Performed at [facility], [address] [place], [place] [address]  Lactic acid, plasma     Status: None   Collection Time: 01/04/19  4:42 AM  Result Value Ref Range   Lactic Acid, Venous 1.2 0.5 - 1.9 mmol/L    Comment: Performed at [facility], [address] [place], [place] [address]   Ct Abdomen Pelvis W Contrast  Result Date: 01/04/2019 CLINICAL DATA:  Abdominal pain and vomiting EXAM: CT ABDOMEN AND PELVIS WITH CONTRAST TECHNIQUE: Multidetector CT imaging of the abdomen and pelvis was performed using the standard protocol following bolus administration of intravenous contrast. CONTRAST:  178mL OMNIPAQUE IOHEXOL 300 MG/ML  SOLN COMPARISON:  07/08/2017 FINDINGS: Lower chest:  Elevated right diaphragm, chronic.  No acute finding Hepatobiliary: Small right lobe cystic densities.  There is cholecystectomy with chronic intra and extrahepatic bile duct dilatation. Pancreas: No acute or masslike finding. Spleen: Stable. Adrenals/Urinary Tract: Negative  adrenals. No hydronephrosis or stone. Small renal cystic densities. Unremarkable bladder. Stomach/Bowel: Dilated small bowel with mild mesenteric edema above a transition point in the low central abdomen. Segment immediately above the transition point contains fecalized small bowel contents. Bowel dilatation is mild, up to 3 cm. No evidence of bowel necrosis. No underlying mass is seen. Appendectomy. Sigmoid diverticulosis. Vascular/Lymphatic: Diffuse atherosclerotic calcification of the aorta and iliacs. No mass or adenopathy. Reproductive:Hysterectomy. Other: No ascites or pneumoperitoneum. Musculoskeletal: No acute abnormalities. IMPRESSION: Small bowel obstruction with transition in the low pelvis, presumably due to adhesions. Electronically Signed   By: Monte Fantasia M.D.   On: 01/04/2019 04:08    Pending Labs Unresulted Labs (From admission, onward)    Start     Ordered   01/04/19 0425  Lactic acid, plasma  Now then every 2 hours,   STAT     01/04/19 0424   Signed and Held  CBC  (heparin)  Once,   R    Comments:  Baseline for heparin therapy IF NOT ALREADY DRAWN.  Notify MD if PLT < 100 K.    Signed and Held   Signed and Held  Creatinine, serum  (heparin)  Once,   R    Comments:  Baseline for heparin therapy IF NOT ALREADY DRAWN.    Signed and Held   Signed and Held  CBC  Tomorrow morning,   R     Signed and Held   Signed and Held  TSH  Once,   R     Signed and Held   Signed and Held  Comprehensive metabolic panel  Tomorrow morning,   R     Signed and Held   Signed and Held  APTT  Tomorrow morning,   R     Signed and Held   Signed and Held  Protime-INR  Tomorrow morning,   R     Signed and Held          Vitals/Pain Today's Vitals   01/04/19 0403 01/04/19 0444 01/04/19 0446 01/04/19 0511  BP:   (!) 163/68   Pulse:   71   Resp:   18   Temp:      TempSrc:      SpO2:   98%   Weight:      Height:      PainSc: 2  5   2      Isolation Precautions No active  isolations  Medications Medications  ceFEPIme (MAXIPIME) 1 g in sodium chloride 0.9 % 100 mL IVPB (has no administration in time range)  sodium chloride flush (NS) 0.9 % injection 3 mL (3 mLs Intravenous Given 01/04/19 0233)  sodium chloride 0.9 % bolus 500 mL (0 mLs Intravenous Stopped 01/04/19 0339)  ondansetron (ZOFRAN) injection 4 mg (4 mg Intravenous Given 01/04/19 0233)  morphine 2 MG/ML injection 2 mg (2 mg Intravenous Given 01/04/19 0233)  iohexol (OMNIPAQUE) 300 MG/ML solution 100 mL (100 mLs Intravenous Contrast Given 01/04/19 0306)  morphine 2 MG/ML injection 2 mg (2 mg Intravenous Given 01/04/19 0446)  ceFEPIme (MAXIPIME) 1 g in sodium chloride 0.9 % 100 mL IVPB (1 g Intravenous New Bag/Given 01/04/19 0456)    Mobility x1 assist

## 2019-01-04 NOTE — Progress Notes (Signed)
Admitted this morning for small bowel obstruction, medications reviewed, labs reviewed, continue n.p.o., NG tube suctioning, seen by surgery.  Continue other medicines, labs reviewed.

## 2019-01-04 NOTE — ED Provider Notes (Signed)
[facility] Emergency Department Provider Note ____________________________________________   First MD Initiated Contact with Patient 01/04/19 0158     (approximate)  I have reviewed the triage vital signs and the nursing notes.   HISTORY  Chief Complaint Abdominal Pain    HPI Cathy Hines is a 73 y.o. female with PMH as noted below who presents with acute onset of nausea and vomiting about 6 hours ago, associated with abdominal pain mainly in the right mid and lower abdomen.  She also reports one loose bowel movement in the last few hours.  She states that this occurred after she ate a salad.  She states that earlier today she was feeling well.  The patient reports that she had a cyst on her liver that ruptured and this felt somewhat similar.  She denies any dysuria or other urinary symptoms.   Past Medical History:  Diagnosis Date  . Arthritis    osteo  . Asthma    wheezing  . Bell's palsy    H/O  . Cancer ([place])    skin cancer on face and right arm- 2013  . Cellulitis    left shin  . Complication of anesthesia    woke up during one surgery  . Coronary artery disease    stent  . Cough    chronic  . CVA (cerebral vascular accident) ([place])   . Dementia ([place])    very mild  . Depression    distant H/O  . Diabetes mellitus without complication ([place])    type 2  . Diverticulitis    OSIS  . Gastritis   . GERD (gastroesophageal reflux disease)   . History of hiatal hernia   . Hypercholesterolemia   . Hypertension    on HTN meds  . Hypothyroidism   . Liver cyst   . Memory loss   . MVP (mitral valve prolapse)   . Pneumonia    in past  . Renal insufficiency    horseshoe kidney  . Sleep apnea    no CPAP  . Vertigo    dizziness  . Wears dentures    full upper and lower    Patient Active Problem List   Diagnosis Date Noted  . UTI (urinary tract infection) 01/04/2019  . NONSPECIFIC ABN FINDING RAD & OTH EXAM GI TRACT 12/24/2010     Past Surgical History:  Procedure Laterality Date  . ABDOMINAL HYSTERECTOMY    . APPENDECTOMY    . BROW LIFT Bilateral 06/22/2018   Procedure: BLEPHAROPLASTY UPPER EYELID WITH EXCESS SKIN DIABETIC;  Surgeon: Karle Starch, MD;  Location: [place];  Service: Ophthalmology;  Laterality: Bilateral;  . CARPAL TUNNEL RELEASE    . CHOLECYSTECTOMY    . COLONOSCOPY    . HEMORRHOID SURGERY    . LIVER SURGERY     excision liver cyst  . PTOSIS REPAIR Bilateral 06/22/2018   Procedure: BROW PTOSIS REPAIR;  Surgeon: Karle Starch, MD;  Location: [place];  Service: Ophthalmology;  Laterality: Bilateral;  Diabetes - oral meds    Prior to Admission medications   Medication Sig Start Date End Date Taking? Authorizing Provider  acyclovir (ZOVIRAX) 200 MG capsule Take 200 mg by mouth 5 (five) times daily.   Yes [provider]  albuterol (PROVENTIL HFA;VENTOLIN HFA) 108 (90 Base) MCG/ACT inhaler Inhale into the lungs every 6 (six) hours as needed for wheezing or shortness of breath.   Yes [provider]  amLODipine (NORVASC) 5  MG tablet Take 5 mg by mouth daily.   Yes [provider]  BIOTIN PO Take by mouth daily.   Yes [provider]  Cyanocobalamin (VITAMIN B12) 1000 MCG TBCR Take 1 tablet by mouth daily.    Yes [provider]  folic acid (FOLVITE) 1 MG tablet Take 1 tablet by mouth daily.    Yes [provider]  glimepiride (AMARYL) 2 MG tablet Take 2 mg by mouth daily with breakfast.   Yes [provider]  levothyroxine (SYNTHROID, LEVOTHROID) 75 MCG tablet Take 75 mcg by mouth daily before breakfast.   Yes [provider]  Magnesium 500 MG TABS Take 1,000 mg by mouth daily.   Yes [provider]  metoprolol succinate (TOPROL-XL) 25 MG 24 hr tablet Take 25 mg by mouth daily.   Yes [provider]  potassium chloride (K-DUR) 10 MEQ tablet Take 10 mEq by mouth daily.   Yes [provider]  pramipexole (MIRAPEX) 0.125 MG tablet Take 0.125 mg by mouth 3 (three) times daily.   Yes [provider]  traMADol (ULTRAM) 50 MG tablet Take by mouth every 6 (six) hours as needed.   Yes [provider]  erythromycin ophthalmic ointment Apply to sutures 4 times a day for 10-12 days.  Discontinue if allergy develops and call our office Patient not taking: Reported on 01/04/2019 06/22/18   Karle Starch, MD  hydrochlorothiazide (HYDRODIURIL) 25 MG tablet Take 25 mg by mouth daily.    [provider]    Allergies Ace inhibitors; Amoxicillin; Flagyl [metronidazole]; Gabapentin; Milk-related compounds; Namenda [memantine hcl]; Statins; Sulfa antibiotics; and Verapamil  Family History  Problem Relation Age of Onset  . Breast cancer Mother 48  . Breast cancer Sister 23    Social History Social History   Tobacco Use  . Smoking status: Former Smoker    Last attempt to quit: 1983    Years since quitting: 37.1  . Smokeless tobacco: Never Used  Substance Use Topics  . Alcohol use: No  . Drug use: Not on file    Review of Systems  Constitutional: No fever. Eyes: No redness. ENT: No sore throat. Cardiovascular: Denies chest pain. Respiratory: Denies shortness of breath. Gastrointestinal: Positive for nausea and vomiting. Genitourinary: Negative for dysuria.  Musculoskeletal: Negative for back pain. Skin: Negative for rash. Neurological: Negative for headaches.   ____________________________________________   PHYSICAL EXAM:  VITAL SIGNS: ED Triage Vitals  Enc Vitals Group     BP 01/04/19 0007 (!) 167/83     Pulse Rate 01/04/19 0007 88     Resp 01/04/19 0007 (!) 22     Temp 01/04/19 0007 98.3 F (36.8 C)     Temp Source 01/04/19 0007 Oral     SpO2 01/04/19 0007 99 %     Weight 01/04/19 0005 158 lb (71.7 kg)     Height 01/04/19 0005 4\' 11"  (1.499 m)     Head Circumference --      Peak Flow --      Pain Score 01/04/19 0005 10      Pain Loc --      Pain Edu? --      Excl. in [place]? --     Constitutional: Alert and oriented.  Relatively well appearing and in no acute distress. Eyes: Conjunctivae are normal.  No scleral icterus. Head: Atraumatic. Nose: No congestion/rhinnorhea. Mouth/Throat: Mucous membranes are somewhat dry.   Neck: Normal range of motion.  Cardiovascular: Good peripheral circulation.  Respiratory: Normal respiratory effort.  No retractions.  Gastrointestinal: Soft with mild right lower quadrant tenderness.  No distention.  Genitourinary: No flank tenderness. Musculoskeletal: Extremities warm and well perfused.  Neurologic:  Normal speech and language. No gross focal neurologic deficits are appreciated.  Skin:  Skin is warm and dry. No rash noted. Psychiatric: Mood and affect are normal. Speech and behavior are normal.  ____________________________________________   LABS (all labs ordered are listed, but only abnormal results are displayed)  Labs Reviewed  COMPREHENSIVE METABOLIC PANEL - Abnormal; Notable for the following components:      Result Value   Glucose, Bld 156 (*)    Creatinine, Ser 1.04 (*)    GFR calc non Af Amer 53 (*)    All other components within normal limits  CBC - Abnormal; Notable for the following components:   WBC 14.5 (*)    RBC 5.19 (*)    All other components within normal limits  URINALYSIS, COMPLETE (UACMP) WITH MICROSCOPIC - Abnormal; Notable for the following components:   Color, Urine YELLOW (*)    APPearance CLOUDY (*)    Hgb urine dipstick SMALL (*)    Leukocytes,Ua LARGE (*)    WBC, UA >50 (*)    Bacteria, UA RARE (*)    Non Squamous Epithelial PRESENT (*)    All other components within normal limits  URINE CULTURE  LIPASE, BLOOD  LACTIC ACID, PLASMA  LACTIC ACID, PLASMA  CBC  TSH  COMPREHENSIVE METABOLIC PANEL  APTT  PROTIME-INR   ____________________________________________  EKG  ED ECG REPORT I, Arta Silence, the attending  physician, personally viewed and interpreted this ECG.  Date: 01/04/2019 EKG Time: 0500 Rate: 77 Rhythm: normal sinus rhythm QRS Axis: Borderline right axis Intervals: normal ST/T Wave abnormalities: Nonspecific T wave abnormalities diffusely Narrative Interpretation: Nonspecific diffuse T wave abnormality with no significant change when compared to EKG of 07/08/2017  ____________________________________________  RADIOLOGY  CT abdomen: Findings concerning for small bowel obstruction  ____________________________________________   PROCEDURES  Procedure(s) performed: No  Procedures  Critical Care performed: No ____________________________________________   INITIAL IMPRESSION / ASSESSMENT AND PLAN / ED COURSE  Pertinent labs & imaging results that were available during my care of the patient were reviewed by me and considered in my medical decision making (see chart for details).  73 year old female with PMH as noted above presents with acute onset of nausea and vomiting with some right-sided abdominal pain around 6 hours ago.  She also has had a loose stool since then.  She denies any associated urinary symptoms.  On exam the patient is overall relatively well appearing for her age.  Her abdomen is soft with some mild tenderness in the right lower quadrant and suprapubic area.  She has dry mucous membranes.  The remainder of the exam is as described above.  Initial lab work-up from triage reveals elevated WBC count, and UA findings consistent with possible UTI.  Differential includes UTI/cystitis, pyelonephritis, or non-urinary cause such as acute gastroenteritis,, colitis, diverticulitis, or hepatobiliary cause.  We will give fluids, antiemetic and analgesia, and obtain a CT to further evaluate.  ----------------------------------------- 4:41 AM on 01/04/2019 -----------------------------------------  CT reveals findings concerning for small bowel obstruction.  I consulted  Dr. Rosana Hoes from general surgery who will come to evaluate the patient.  ----------------------------------------- 4:49 AM on 01/04/2019 -----------------------------------------  Dr. Rosana Hoes evaluated the patient.  He advises that based on her clinical presentation, the fact that she had diarrhea and is passing gas, and based  on the appearance on CT, acute small bowel obstruction is unlikely.  He does not recommend NG tube or acute surgical intervention at this time.  He states that the patient can be admitted to medicine and surgery will follow.    UA is consistent with UTI, so I will treat with cephalosporin.  I signed the patient out to the hospitalist Dr. Posey Pronto. ____________________________________________   FINAL CLINICAL IMPRESSION(S) / ED DIAGNOSES  Final diagnoses:  Small bowel obstruction ([place])  Urinary tract infection without hematuria, site unspecified      NEW MEDICATIONS STARTED DURING THIS VISIT:  Current Discharge Medication List       Note:  This document was prepared using Dragon voice recognition software and may include unintentional dictation errors.    Arta Silence, MD 01/04/19 [id number]

## 2019-01-04 NOTE — ED Notes (Signed)
Patient transported to CT 

## 2019-01-04 NOTE — Consult Note (Signed)
SURGICAL CONSULTATION NOTE (initial) - cpt: 01751  HISTORY OF PRESENT ILLNESS (HPI):  73 y.o. female presented to [facility] ED today for evaluation of abdominal pain, diarrhea, and N/V < 24 hours. Patient reports she was feeling in her usual state of health when yesterday afternoon developed RLQ abdominal pain, diarrhea, and nausea with multiple episodes of non-bloody emesis following taco salad and pineapple pizza. She reports she continues to pass flatus, but feels full with her pain and nausea currently improved vs controlled with meds in ED. Patient being admitted to medical service for dehydration and gastroenteritis vs early or partial SBO, attributable to post-hysterectomy pelvic adhesions. She otherwise denies fever/chills, CP, or SOB and denies prior SBO.  Surgery is consulted by ED and medical physicians Dr. Cherylann Banas and Dr. Posey Pronto, respectively, in this context for evaluation and management of possible early vs partial SBO.  PAST MEDICAL HISTORY (PMH):  Past Medical History:  Diagnosis Date  . Arthritis    osteo  . Asthma    wheezing  . Bell's palsy    H/O  . Cancer ([place])    skin cancer on face and right arm- 2013  . Cellulitis    left shin  . Complication of anesthesia    woke up during one surgery  . Coronary artery disease    stent  . Cough    chronic  . CVA (cerebral vascular accident) ([place])   . Dementia ([place])    very mild  . Depression    distant H/O  . Diabetes mellitus without complication ([place])    type 2  . Diverticulitis    OSIS  . Gastritis   . GERD (gastroesophageal reflux disease)   . History of hiatal hernia   . Hypercholesterolemia   . Hypertension    on HTN meds  . Hypothyroidism   . Liver cyst   . Memory loss   . MVP (mitral valve prolapse)   . Pneumonia    in past  . Renal insufficiency    horseshoe kidney  . Sleep apnea    no CPAP  . Vertigo    dizziness  . Wears dentures    full upper and lower    PAST SURGICAL HISTORY ([place]):  Past  Surgical History:  Procedure Laterality Date  . ABDOMINAL HYSTERECTOMY    . APPENDECTOMY    . BROW LIFT Bilateral 06/22/2018   Procedure: BLEPHAROPLASTY UPPER EYELID WITH EXCESS SKIN DIABETIC;  Surgeon: Karle Starch, MD;  Location: [place];  Service: Ophthalmology;  Laterality: Bilateral;  . CARPAL TUNNEL RELEASE    . CHOLECYSTECTOMY    . COLONOSCOPY    . HEMORRHOID SURGERY    . LIVER SURGERY     excision liver cyst  . PTOSIS REPAIR Bilateral 06/22/2018   Procedure: BROW PTOSIS REPAIR;  Surgeon: Karle Starch, MD;  Location: [place];  Service: Ophthalmology;  Laterality: Bilateral;  Diabetes - oral meds    MEDICATIONS:  Prior to Admission medications   Medication Sig Start Date End Date Taking? Authorizing Provider  acyclovir (ZOVIRAX) 200 MG capsule Take 200 mg by mouth 5 (five) times daily.   Yes [provider]  albuterol (PROVENTIL HFA;VENTOLIN HFA) 108 (90 Base) MCG/ACT inhaler Inhale into the lungs every 6 (six) hours as needed for wheezing or shortness of breath.   Yes [provider]  amLODipine (NORVASC) 5 MG tablet Take 5 mg by mouth daily.   Yes [provider]  BIOTIN PO Take by mouth daily.  Yes [provider]  Cyanocobalamin (VITAMIN B12) 1000 MCG TBCR Take 1 tablet by mouth daily.    Yes [provider]  folic acid (FOLVITE) 1 MG tablet Take 1 tablet by mouth daily.    Yes [provider]  glimepiride (AMARYL) 2 MG tablet Take 2 mg by mouth daily with breakfast.   Yes [provider]  levothyroxine (SYNTHROID, LEVOTHROID) 75 MCG tablet Take 75 mcg by mouth daily before breakfast.   Yes [provider]  Magnesium 500 MG TABS Take 1,000 mg by mouth daily.   Yes [provider]  metoprolol succinate (TOPROL-XL) 25 MG 24 hr tablet Take 25 mg by mouth daily.   Yes [provider]  potassium chloride (K-DUR) 10 MEQ tablet Take 10 mEq by mouth daily.   Yes [provider]  pramipexole (MIRAPEX) 0.125 MG tablet Take 0.125 mg by mouth 3 (three) times daily.   Yes [provider]  traMADol (ULTRAM) 50 MG tablet Take by mouth every 6 (six) hours as needed.   Yes [provider]  erythromycin ophthalmic ointment Apply to sutures 4 times a day for 10-12 days.  Discontinue if allergy develops and call our office Patient not taking: Reported on 01/04/2019 06/22/18   Karle Starch, MD  hydrochlorothiazide (HYDRODIURIL) 25 MG tablet Take 25 mg by mouth daily.    [provider]    ALLERGIES:  Allergies  Allergen Reactions  . Ace Inhibitors Cough  . Amoxicillin     Pt doesn't remember  . Flagyl [Metronidazole] Other (See Comments)    Pt cannot remember  . Gabapentin     headache  . Milk-Related Compounds Nausea And Vomiting  . Namenda [Memantine Hcl]     Pt doesn't remember  . Statins     Pt doesn't remember  . Sulfa Antibiotics Swelling  . Verapamil     Pt doesn't remember    SOCIAL HISTORY:  Social History   Socioeconomic History  . Marital status: Married    Spouse name: Not on file  . Number of children: Not on file  . Years of education: Not on file  . Highest education level: Not on file  Occupational History  . Not on file  Social Needs  . Financial resource strain: Not on file  . Food insecurity:    Worry: Not on file    Inability: Not on file  . Transportation needs:    Medical: Not on file    Non-medical: Not on file  Tobacco Use  . Smoking status: Former Smoker    Last attempt to quit: 1983    Years since quitting: 37.1  . Smokeless tobacco: Never Used  Substance and Sexual Activity  . Alcohol use: No  . Drug use: Not on file  . Sexual activity: Not on file  Lifestyle  . Physical activity:    Days per week: Not on file    Minutes per session: Not on file  . Stress: Not on file  Relationships  . Social connections:    Talks on phone: Not on file    Gets together: Not on file     Attends religious service: Not on file    Active member of club or organization: Not on file    Attends meetings of clubs or organizations: Not on file    Relationship status: Not on file  . Intimate partner violence:    Fear of current or ex partner: Not on file  Emotionally abused: Not on file    Physically abused: Not on file    Forced sexual activity: Not on file  Other Topics Concern  . Not on file  Social History Narrative  . Not on file    The patient currently resides (home / rehab facility / nursing home): Home The patient normally is (ambulatory / bedbound): Ambulatory   FAMILY HISTORY:  Family History  Problem Relation Age of Onset  . Breast cancer Mother 58  . Breast cancer Sister 48    REVIEW OF SYSTEMS:  Constitutional: denies weight loss, fever, chills, or sweats  Eyes: denies any other vision changes, history of eye injury  ENT: denies sore throat, hearing problems  Respiratory: denies shortness of breath, wheezing  Cardiovascular: denies chest pain, palpitations  Gastrointestinal: abdominal pain, N/V, and bowel function as per HPI Genitourinary: denies burning with urination or urinary frequency Musculoskeletal: denies any other joint pains or cramps  Skin: denies any other rashes or skin discolorations  Neurological: denies any other headache, dizziness, weakness  Psychiatric: denies any other depression, anxiety   All other review of systems were negative   VITAL SIGNS:  Temp:  [98.3 F (36.8 C)] 98.3 F (36.8 C) (02/18 0007) Pulse Rate:  [68-88] 71 (02/18 0446) Resp:  [18-22] 18 (02/18 0446) BP: (161-167)/(64-83) 163/68 (02/18 0446) SpO2:  [95 %-99 %] 98 % (02/18 0446) Weight:  [71.7 kg] 71.7 kg (02/18 0005)     Height: 4\' 11"  (149.9 cm) Weight: 71.7 kg BMI (Calculated): 31.89   INTAKE/OUTPUT:  This shift: Total I/O In: 524.6 [IV Piggyback:524.6] Out: -   Last 2 shifts: @IOLAST2SHIFTS @   PHYSICAL EXAM:  Constitutional:  -- Overweight body  habitus  -- Awake, alert, and oriented x3, no apparent distress Eyes:  -- Pupils equally round and reactive to light  -- No scleral icterus, B/L no occular discharge Ear, nose, throat: -- Neck is FROM WNL -- No jugular venous distension  Pulmonary:  -- No wheezes or rhales -- Equal breath sounds bilaterally -- Breathing non-labored at rest Cardiovascular:  -- S1, S2 present  -- No pericardial rubs  Gastrointestinal:  -- Abdomen soft, nontender, non-distended, no guarding or rebound tenderness -- No abdominal masses appreciated, pulsatile or otherwise  Musculoskeletal and Integumentary:  -- Wounds or skin discoloration: None appreciated -- Extremities: B/L UE and LE FROM, hands and feet warm, no edema  Neurologic:  -- Motor function: Intact and symmetric -- Sensation: Intact and symmetric Psychiatric:  -- Mood and affect WNL  Labs:  CBC Latest Ref Rng & Units 01/04/2019 07/08/2017  WBC 4.0 - 10.5 K/uL 14.5(H) 9.2  Hemoglobin 12.0 - 15.0 g/dL 14.9 13.7  Hematocrit 36.0 - 46.0 % 45.4 40.0  Platelets 150 - 400 K/uL 253 209   CMP Latest Ref Rng & Units 01/04/2019 07/08/2017  Glucose 70 - 99 mg/dL 156(H) 258(H)  BUN 8 - 23 mg/dL 10 18  Creatinine 0.44 - 1.00 mg/dL 1.04(H) 1.05(H)  Sodium 135 - 145 mmol/L 140 139  Potassium 3.5 - 5.1 mmol/L 3.5 3.3(L)  Chloride 98 - 111 mmol/L 108 104  CO2 22 - 32 mmol/L 23 26  Calcium 8.9 - 10.3 mg/dL 9.5 9.5  Total Protein 6.5 - 8.1 g/dL 8.0 -  Total Bilirubin 0.3 - 1.2 mg/dL 1.0 -  Alkaline Phos 38 - 126 U/L 81 -  AST 15 - 41 U/L 26 -  ALT 0 - 44 U/L 25 -   Imaging studies:  CT Abdomen and Pelvis  with IV Contrast (01/04/2019) - personally reviewed and discussed with patient and ED physician Dilated small bowel with mild mesenteric edema above a transition point in the low central abdomen. Segment immediately above the transition point contains fecalized small bowel contents. Bowel dilatation is mild, up to 3 cm. No evidence of bowel  necrosis. No underlying mass is seen. Appendectomy. Sigmoid diverticulosis.  Assessment/Plan: (ICD-10's: K56.51 vs K52.9) 73 y.o. female with clinical presentation suggestive of gastroenteritis vs partial SBO and CT imaging suggestive of early small bowel obstruction including a single dilated fecalized pelvic loop of small intestine following prior abdominal hysterectomy, complicated by dehydration following multiple episodes of non-bloody emesis over the past 24 hours and pertinent comorbidities including advanced chronological age, DM, HTN, hypercholesterolemia, COPD, CAD s/p PCI, PAD, history of stroke, CKD, asthma, OSA, hypothyroidism, hiatal hernia with GERD and history of gastritis, dementia, history of skin cancer, and major depression disorder.   - NPO for now with IV fluids   - okay to hold off NG tube for now  - monitor abdominal exam and bowel function  - NG tube if pain worsens or if nausea/vomiting (avoid narcotics that may mask exam)  - currently no indication for surgical intervention at this time  - medical management of comorbidities  - DVT prophylaxis, ambulate  All of the above findings and recommendations were discussed with the patient and her family, and all of patient's and her family's questions were answered to their expressed satisfaction.  Thank you for the opportunity to participate in this patient's care.   -- Marilynne Drivers Rosana Hoes, MD, [place]: [facility] - Partnering for exceptional care. Office: [phone]

## 2019-01-04 NOTE — H&P (Signed)
[facility] NAME: Cathy Hines    MR#:  [id number]  DATE OF BIRTH:  [birth year]  DATE OF ADMISSION:  01/04/2019  PRIMARY CARE PHYSICIAN: Rusty Aus, MD   REQUESTING/REFERRING PHYSICIAN: Dr. Molli Posey   CHIEF COMPLAINT:   Chief Complaint  Patient presents with  . Abdominal Pain    HISTORY OF PRESENT ILLNESS:  Cathy Hines  is a 73 y.o. female with a known history listed below presented to emergency room for evaluation of abdominal pain associated with multiple episodes of nausea and vomiting.  Patient also had loose stool yesterday but currently denies any stool.  Patient started having above symptoms since yesterday.  Patient had 10-12 episodes of nonbloody emesis associated with diffuse abdominal pain.  Patient has more abdominal pain in right lower quadrant compared to other areas.  Denies fever or chills.  Patient denies any UTI symptoms but developed back pain 2 days ago.  No other complaints.  In emergency room patient underwent CT scan that suggestive of small bowel obstruction.  Urinalysis suggestive of UTI.  General surgery consult requested in emergency room.  Hospitalist team requested for admission.  Of note, patient has small mass in her back and her PCP said to monitor it. Pt feels that mass is slowly growing.   PAST MEDICAL HISTORY:   Past Medical History:  Diagnosis Date  . Arthritis    osteo  . Asthma    wheezing  . Bell's palsy    H/O  . Cancer ([place])    skin cancer on face and right arm- 2013  . Cellulitis    left shin  . Complication of anesthesia    woke up during one surgery  . Coronary artery disease    stent  . Cough    chronic  . CVA (cerebral vascular accident) ([place])   . Dementia ([place])    very mild  . Depression    distant H/O  . Diabetes mellitus without complication ([place])    type 2  . Diverticulitis    OSIS  . Gastritis   . GERD (gastroesophageal reflux disease)   . History of  hiatal hernia   . Hypercholesterolemia   . Hypertension    on HTN meds  . Hypothyroidism   . Liver cyst   . Memory loss   . MVP (mitral valve prolapse)   . Pneumonia    in past  . Renal insufficiency    horseshoe kidney  . Sleep apnea    no CPAP  . Vertigo    dizziness  . Wears dentures    full upper and lower    PAST SURGICAL HISTORY:   Past Surgical History:  Procedure Laterality Date  . ABDOMINAL HYSTERECTOMY    . APPENDECTOMY    . BROW LIFT Bilateral 06/22/2018   Procedure: BLEPHAROPLASTY UPPER EYELID WITH EXCESS SKIN DIABETIC;  Surgeon: Karle Starch, MD;  Location: [facility];  Service: Ophthalmology;  Laterality: Bilateral;  . CARPAL TUNNEL RELEASE    . CHOLECYSTECTOMY    . COLONOSCOPY    . HEMORRHOID SURGERY    . LIVER SURGERY     excision liver cyst  . PTOSIS REPAIR Bilateral 06/22/2018   Procedure: BROW PTOSIS REPAIR;  Surgeon: Karle Starch, MD;  Location: [facility];  Service: Ophthalmology;  Laterality: Bilateral;  Diabetes - oral meds    SOCIAL HISTORY:   Social History   Tobacco Use  . Smoking status:  Former Smoker    Last attempt to quit: 1983    Years since quitting: 37.1  . Smokeless tobacco: Never Used  Substance Use Topics  . Alcohol use: No    FAMILY HISTORY:   Family History  Problem Relation Age of Onset  . Breast cancer Mother 39  . Breast cancer Sister 26    DRUG ALLERGIES:   Allergies  Allergen Reactions  . Ace Inhibitors Cough  . Amoxicillin     Pt doesn't remember  . Flagyl [Metronidazole] Other (See Comments)    Pt cannot remember  . Gabapentin     headache  . Milk-Related Compounds Nausea And Vomiting  . Namenda [Memantine Hcl]     Pt doesn't remember  . Statins     Pt doesn't remember  . Sulfa Antibiotics Swelling  . Verapamil     Pt doesn't remember    REVIEW OF SYSTEMS:   ROS -12 point review of system reviewed positive as per HPI otherwise negative  MEDICATIONS AT HOME:   Prior to  Admission medications   Medication Sig Start Date End Date Taking? Authorizing Provider  acyclovir (ZOVIRAX) 200 MG capsule Take 200 mg by mouth 5 (five) times daily.   Yes [provider]  albuterol (PROVENTIL HFA;VENTOLIN HFA) 108 (90 Base) MCG/ACT inhaler Inhale into the lungs every 6 (six) hours as needed for wheezing or shortness of breath.   Yes [provider]  amLODipine (NORVASC) 5 MG tablet Take 5 mg by mouth daily.   Yes [provider]  BIOTIN PO Take by mouth daily.   Yes [provider]  Cyanocobalamin (VITAMIN B12) 1000 MCG TBCR Take 1 tablet by mouth daily.    Yes [provider]  folic acid (FOLVITE) 1 MG tablet Take 1 tablet by mouth daily.    Yes [provider]  glimepiride (AMARYL) 2 MG tablet Take 2 mg by mouth daily with breakfast.   Yes [provider]  levothyroxine (SYNTHROID, LEVOTHROID) 75 MCG tablet Take 75 mcg by mouth daily before breakfast.   Yes [provider]  Magnesium 500 MG TABS Take 1,000 mg by mouth daily.   Yes [provider]  metoprolol succinate (TOPROL-XL) 25 MG 24 hr tablet Take 25 mg by mouth daily.   Yes [provider]  potassium chloride (K-DUR) 10 MEQ tablet Take 10 mEq by mouth daily.   Yes [provider]  pramipexole (MIRAPEX) 0.125 MG tablet Take 0.125 mg by mouth 3 (three) times daily.   Yes [provider]  traMADol (ULTRAM) 50 MG tablet Take by mouth every 6 (six) hours as needed.   Yes [provider]  erythromycin ophthalmic ointment Apply to sutures 4 times a day for 10-12 days.  Discontinue if allergy develops and call our office Patient not taking: Reported on 01/04/2019 06/22/18   Karle Starch, MD  hydrochlorothiazide (HYDRODIURIL) 25 MG tablet Take 25 mg by mouth daily.    [provider]      VITAL SIGNS:  Blood pressure (!) 163/68, pulse 71, temperature 98.3 F (36.8 C), temperature source Oral, resp. rate  18, height 4\' 11"  (1.499 m), weight 71.7 kg, SpO2 98 %.  PHYSICAL EXAMINATION:  Physical Exam  GENERAL:  73 y.o.-year-old patient lying in the bed with no acute distress.  EYES: Pupils equal, round, reactive to light and accommodation. No scleral icterus. Extraocular muscles intact.  HEENT: Head atraumatic, normocephalic. Oropharynx and nasopharynx clear.  NECK:  Supple, no jugular venous  distention. No thyroid enlargement, no tenderness.  LUNGS: Normal breath sounds bilaterally, no wheezing, rales,rhonchi or crepitation. No use of accessory muscles of respiration.  CARDIOVASCULAR: S1, S2 normal. No murmurs, rubs, or gallops.  ABDOMEN: Soft, mild diffuse tenderness, nondistended. Bowel sounds hypoactive. No organomegaly or mass.  EXTREMITIES: No pedal edema, cyanosis, or clubbing.  NEUROLOGIC: Cranial nerves II through XII are intact. Muscle strength 5/5 in all extremities. Sensation intact. Gait not checked.  PSYCHIATRIC: The patient is alert and oriented x 3.  SKIN: warm dry, small mass in back  LABORATORY PANEL:   CBC Recent Labs  Lab 01/04/19 0009  WBC 14.5*  HGB 14.9  HCT 45.4  PLT 253   ------------------------------------------------------------------------------------------------------------------  Chemistries  Recent Labs  Lab 01/04/19 0009  NA 140  K 3.5  CL 108  CO2 23  GLUCOSE 156*  BUN 10  CREATININE 1.04*  CALCIUM 9.5  AST 26  ALT 25  ALKPHOS 81  BILITOT 1.0   ------------------------------------------------------------------------------------------------------------------  Cardiac Enzymes No results for input(s): TROPONINI in the last 168 hours. ------------------------------------------------------------------------------------------------------------------  RADIOLOGY:  Ct Abdomen Pelvis W Contrast  Result Date: 01/04/2019 CLINICAL DATA:  Abdominal pain and vomiting EXAM: CT ABDOMEN AND PELVIS WITH CONTRAST TECHNIQUE: Multidetector CT imaging of  the abdomen and pelvis was performed using the standard protocol following bolus administration of intravenous contrast. CONTRAST:  159mL OMNIPAQUE IOHEXOL 300 MG/ML  SOLN COMPARISON:  07/08/2017 FINDINGS: Lower chest:  Elevated right diaphragm, chronic.  No acute finding Hepatobiliary: Small right lobe cystic densities. There is cholecystectomy with chronic intra and extrahepatic bile duct dilatation. Pancreas: No acute or masslike finding. Spleen: Stable. Adrenals/Urinary Tract: Negative adrenals. No hydronephrosis or stone. Small renal cystic densities. Unremarkable bladder. Stomach/Bowel: Dilated small bowel with mild mesenteric edema above a transition point in the low central abdomen. Segment immediately above the transition point contains fecalized small bowel contents. Bowel dilatation is mild, up to 3 cm. No evidence of bowel necrosis. No underlying mass is seen. Appendectomy. Sigmoid diverticulosis. Vascular/Lymphatic: Diffuse atherosclerotic calcification of the aorta and iliacs. No mass or adenopathy. Reproductive:Hysterectomy. Other: No ascites or pneumoperitoneum. Musculoskeletal: No acute abnormalities. IMPRESSION: Small bowel obstruction with transition in the low pelvis, presumably due to adhesions. Electronically Signed   By: Monte Fantasia M.D.   On: 01/04/2019 04:08      IMPRESSION AND PLAN:   1.  UTI: Patient started on IV cefepime.  Follow-up urine culture.  Symptomatic treatment.  2.  Small bowel obstruction: Surgery consult requested in emergency room.  Recommended conservative treatment.  Keep patient n.p.o.  IV hydration.  Symptomatic treatment.  Follow-up surgery recommendation.  3.  Hypertension: Not at goal.  Likely related to pain.  Continue home medications.  Symptomatic treatment.  PRN IV hydralazine.  4.  Diabetes: Monitor CBG.  Sliding scale insulin.  5. Small mass in back : Possible lipoma.  Patient will need biopsy as outpatient with primary care physician for  further evaluation and treatment.  6.  Chronic other medical problems: Monitor.  Continue home medication as ordered  DVT prophylaxis: Heparin subcutaneous Gi PPX: PPI  Case discussed with patient in detail.  Further treatment based on clinical course and specialist evaluation.  All the records are reviewed and case discussed with ED provider.   CODE STATUS: Full  TOTAL TIME TAKING CARE OF THIS PATIENT: 36 minutes.    Sedalia Muta M.D on 01/04/2019 at 5:28 AM  Between 7am to 6pm - Pager - [phone]  After 6pm go to [email or web address] -  password Airline pilot  [facility]  Office  [phone]  CC: Primary care physician; Rusty Aus, MD

## 2019-01-04 NOTE — Progress Notes (Signed)
Pharmacy Antibiotic Note  Cathy Hines is a 73 y.o. female admitted on 01/04/2019 with UTI.  Pharmacy has been consulted for cefepime dosing.  Plan: Cefepime 1 gram q 12 hours ordered  Height: 4\' 11"  (149.9 cm) Weight: 158 lb (71.7 kg) IBW/kg (Calculated) : 43.2  Temp (24hrs), Avg:98.3 F (36.8 C), Min:98.3 F (36.8 C), Max:98.3 F (36.8 C)  Recent Labs  Lab 01/04/19 0009 01/04/19 0442  WBC 14.5*  --   CREATININE 1.04*  --   LATICACIDVEN  --  1.2    Estimated Creatinine Clearance: 40.9 mL/min (A) (by C-G formula based on SCr of 1.04 mg/dL (H)).    Allergies  Allergen Reactions  . Ace Inhibitors Cough  . Amoxicillin     Pt doesn't remember  . Flagyl [Metronidazole] Other (See Comments)    Pt cannot remember  . Gabapentin     headache  . Milk-Related Compounds Nausea And Vomiting  . Namenda [Memantine Hcl]     Pt doesn't remember  . Statins     Pt doesn't remember  . Sulfa Antibiotics Swelling  . Verapamil     Pt doesn't remember    Antimicrobials this admission: Cefepime 2/18  >>    >>   Dose adjustments this admission:   Microbiology results:      2/18 UA: LE(+)  NO2(-)  WBC >50  Thank you for allowing pharmacy to be a part of this patient's care.  Arzu Mcgaughey S 01/04/2019 5:17 AM

## 2019-01-04 NOTE — Progress Notes (Signed)
Verbal order to place order for pt to be hooked to low-intermit suction at this time. Pt updated   Cathy Hines [employer]

## 2019-01-04 NOTE — Progress Notes (Signed)
[facility] Hospital Day(s): 0.   Post op day(s):  Marland Kitchen   Interval History: Patient seen and examined on same day of original consult. Patient reports return of RLQ abdominal pain this morning a few minutes prior to assessment as well as abdominal distension. No reports of flatus or BM since admission. No complaints of nausea or emesis. Has been NPO.   Review of Systems:  Constitutional: denies fever, chills  Gastrointestinal: + abdominal pain (RLQ)/distension, denied N/V, or diarrhea/and bowel function as per interval history   Vital signs in last 24 hours: [min-max] current  Temp:  [98.3 F (36.8 C)-98.6 F (37 C)] 98.6 F (37 C) (02/18 0551) Pulse Rate:  [68-88] 71 (02/18 0841) Resp:  [18-22] 18 (02/18 0551) BP: (155-167)/(58-83) 156/65 (02/18 0841) SpO2:  [95 %-100 %] 100 % (02/18 0551) Weight:  [71.7 kg-73.9 kg] 73.9 kg (02/18 0551)     Height: 4\' 11"  (149.9 cm) Weight: 73.9 kg BMI (Calculated): 32.89   Intake/Output this shift:  No intake/output data recorded.   Intake/Output last 2 shifts:  @IOLAST2SHIFTS @   Physical Exam:  Constitutional: alert, cooperative and no distress  Respiratory: breathing non-labored at rest  Gastrointestinal: soft, RLQ tenderness, and mild distention  Labs:  CBC Latest Ref Rng & Units 01/04/2019 01/04/2019 07/08/2017  WBC 4.0 - 10.5 K/uL 12.0(H) 14.5(H) 9.2  Hemoglobin 12.0 - 15.0 g/dL 13.3 14.9 13.7  Hematocrit 36.0 - 46.0 % 40.7 45.4 40.0  Platelets 150 - 400 K/uL 245 253 209   CMP Latest Ref Rng & Units 01/04/2019 01/04/2019 07/08/2017  Glucose 70 - 99 mg/dL 149(H) 156(H) 258(H)  BUN 8 - 23 mg/dL 10 10 18   Creatinine 0.44 - 1.00 mg/dL 1.01(H) 1.04(H) 1.05(H)  Sodium 135 - 145 mmol/L 138 140 139  Potassium 3.5 - 5.1 mmol/L 3.8 3.5 3.3(L)  Chloride 98 - 111 mmol/L 108 108 104  CO2 22 - 32 mmol/L 24 23 26   Calcium 8.9 - 10.3 mg/dL 9.0 9.5 9.5  Total Protein 6.5 - 8.1 g/dL 7.3 8.0 -  Total Bilirubin  0.3 - 1.2 mg/dL 1.1 1.0 -  Alkaline Phos 38 - 126 U/L 74 81 -  AST 15 - 41 U/L 49(H) 26 -  ALT 0 - 44 U/L 30 25 -    Imaging studies: No new pertinent imaging studies   Assessment/Plan: (ICD-10's: K36.51) 73 y.o. female with worsening abdominal pain and distension without flatus which is more concerning for partial small bowel obstruction likely attributable to post-surgical adhesions (abdominal hysterectomy), complicated by pertinent comorbidities including advanced chronological age, DM, HTN, hypercholesterolemia, COPD, CAD s/p PCI, PAD, history of stroke, CKD, asthma, OSA, hypothyroidism, hiatal hernia with GERD and history of gastritis, dementia, history of skin cancer, and major depression disorder.   - NPO, IVF  - Will insert NG tube for nasogastric decompression, patient agreeable             - monitor ongoing bowel function and abdominal exam              - anticipate symptomatic relief within 24 - 48 hours following NGT insertion, followed by "rumbling" the following day and flatus either the same day or the day following the "rumbling" with anticipated length of stay ~3 - 5 days with successful non-operative management for 8 of 10 patients with small bowel obstruction attributed to post-surgical adhesions  - surgical intervention if doesn't improve was also discussed, no indication for emergent intervention currently             -  medical management comorbidities as per medical team             - ambulation encouraged              - DVT prophylaxis  All of the above findings and recommendations were discussed with the patient, and the medical team, and all of patient's questions were answered to her expressed satisfaction.  -- Edison Simon, PA-C [facility] 01/04/2019, 9:17 AM [phone] M-F: 7am - 4pm

## 2019-01-04 NOTE — ED Notes (Signed)
Pt EKG done in room 18. Pt EKG would not export from Central Monitoring. Error message read as Export Failed. For 01/04/2019 05:00 Contact Service. Paper copy of EKG in chart.

## 2019-01-05 DIAGNOSIS — K56609 Unspecified intestinal obstruction, unspecified as to partial versus complete obstruction: Secondary | ICD-10-CM

## 2019-01-05 LAB — URINE CULTURE

## 2019-01-05 LAB — GLUCOSE, CAPILLARY
Glucose-Capillary: 81 mg/dL (ref 70–99)
Glucose-Capillary: 84 mg/dL (ref 70–99)
Glucose-Capillary: 77 mg/dL (ref 70–99)
Glucose-Capillary: 73 mg/dL (ref 70–99)

## 2019-01-05 LAB — HEMOGLOBIN: Hemoglobin: 12.8 g/dL (ref 12.0–15.0)

## 2019-01-05 MED ORDER — DEXTROSE 5 % IV SOLN
INTRAVENOUS | Status: DC
  Administered 2019-01-05: 23:00:00 via INTRAVENOUS

## 2019-01-05 MED ORDER — MENTHOL 3 MG MT LOZG
1.0000 | LOZENGE | OROMUCOSAL | Status: DC | PRN
  Filled 2019-01-05: qty 9

## 2019-01-05 MED ORDER — FAMOTIDINE IN NACL 20-0.9 MG/50ML-% IV SOLN
20.0000 mg | INTRAVENOUS | Status: DC
  Administered 2019-01-05 – 2019-01-06 (×2): 20 mg via INTRAVENOUS
  Filled 2019-01-05 (×2): qty 50

## 2019-01-05 NOTE — Progress Notes (Signed)
[facility] SURGICAL PROGRESS NOTE (cpt [phone])  Hospital Day(s): 1.   Post op day(s):  Marland Kitchen   Interval History: Patient seen and examined, no acute events or new complaints overnight. Patient reports iritation in her throat from the NGT. Since placement of the NGT, she reports that her abdominal pain has improved and he denied any associated fever, chills, nausea, or emesis. She continues to report flatus but no BM. Has been NPO. No additional complaints.   Review of Systems:  Constitutional: denies fever, chills  HEENT: + Sore throat Respiratory: denies any shortness of breath  Cardiovascular: denies chest pain or palpitations  Gastrointestinal: denies abdominal pain, N/V, or diarrhea/and bowel function as per interval history Genitourinary: denies burning with urination or urinary frequency   Vital signs in last 24 hours: [min-max] current  Temp:  [98.1 F (36.7 C)-98.8 F (37.1 C)] 98.1 F (36.7 C) (02/19 1207) Pulse Rate:  [69-74] 74 (02/19 1207) Resp:  [18-20] 20 (02/19 1207) BP: (163-172)/(55-68) 172/63 (02/19 1207) SpO2:  [96 %-97 %] 96 % (02/19 1207)     Height: 4\' 11"  (149.9 cm) Weight: 73.9 kg BMI (Calculated): 32.89   Intake/Output this shift:  Total I/O In: -  Out: 610 [Urine:550; Emesis/NG output:60]     Physical Exam:  Constitutional: alert, cooperative and no distress  HENT: normocephalic without obvious abnormality, NGT in place  Eyes: EOM's grossly intact and symmetric  Respiratory: breathing non-labored at rest  Gastrointestinal: soft, non-tender, and non-distended, no rebound/guarding Musculoskeletal: no edema or wounds, motor and sensation grossly intact, NT    Labs:  CBC Latest Ref Rng & Units 01/04/2019 01/04/2019 07/08/2017  WBC 4.0 - 10.5 K/uL 12.0(H) 14.5(H) 9.2  Hemoglobin 12.0 - 15.0 g/dL 13.3 14.9 13.7  Hematocrit 36.0 - 46.0 % 40.7 45.4 40.0  Platelets 150 - 400 K/uL 245 253 209   CMP Latest Ref Rng & Units 01/04/2019 01/04/2019  07/08/2017  Glucose 70 - 99 mg/dL 149(H) 156(H) 258(H)  BUN 8 - 23 mg/dL 10 10 18   Creatinine 0.44 - 1.00 mg/dL 1.01(H) 1.04(H) 1.05(H)  Sodium 135 - 145 mmol/L 138 140 139  Potassium 3.5 - 5.1 mmol/L 3.8 3.5 3.3(L)  Chloride 98 - 111 mmol/L 108 108 104  CO2 22 - 32 mmol/L 24 23 26   Calcium 8.9 - 10.3 mg/dL 9.0 9.5 9.5  Total Protein 6.5 - 8.1 g/dL 7.3 8.0 -  Total Bilirubin 0.3 - 1.2 mg/dL 1.1 1.0 -  Alkaline Phos 38 - 126 U/L 74 81 -  AST 15 - 41 U/L 49(H) 26 -  ALT 0 - 44 U/L 30 25 -     Imaging studies: No new pertinent imaging studies   Assessment/Plan: (ICD-10's: K53.51) 73 y.o. female with clinically improving partial small bowel obstruction likely attributable to post-surgical adhesions (abdominal hysterectomy), complicated by pertinent comorbidities including advanced chronological age, DM, HTN, hypercholesterolemia, COPD, CAD s/p PCI, PAD, history of stroke, CKD, asthma, OSA, hypothyroidism, hiatal hernia with GERD and history of gastritis, dementia, history of skin cancer, and major depression disorder   - Continue NGT for decompression  - NPO, IVF   - Pain control prn (minimize narcotics), Antiemetics prn   - Continue to monitor abdominal examination, NGT output   - surgical intervention if doesn't improve was also discussed, no indication for emergent intervention currently - medical management comorbidities as per medical team - ambulation encouraged - DVT prophylaxis  All of the above findings and recommendations were discussed with the patient, and the  medical team, and all of patient's questions were answered to her expressed satisfaction.  -- Edison Simon, PA-C [facility] 01/05/2019, 12:46 PM [phone] M-F: 7am - 4pm

## 2019-01-05 NOTE — Progress Notes (Signed)
[facility] [place] NAME: Cathy Hines    MR#:  [id number]  DATE OF BIRTH:  03/18/1946  SUBJECTIVE: Patient admitted for small bowel obstruction, NG tube was placed yesterday as per surgery recommendation, patient states that she has no abdominal pain, after passing small amounts of gas this morning.  She denies any complaints.  Her only complaint is throat discomfort because of NG tube.  CHIEF COMPLAINT:   Chief Complaint  Patient presents with  . Abdominal Pain    REVIEW OF SYSTEMS:   ROS CONSTITUTIONAL: No fever, fatigue or weakness.  EYES: No blurred or double vision.  EARS, NOSE, AND THROAT: No tinnitus or ear pain.  RESPIRATORY: No cough, shortness of breath, wheezing or hemoptysis.  CARDIOVASCULAR: No chest pain, orthopnea, edema.  GASTROINTESTINAL: No nausea, vomiting, diarrhea or abdominal pain.  GENITOURINARY: No dysuria, hematuria.  ENDOCRINE: No polyuria, nocturia,  HEMATOLOGY: No anemia, easy bruising or bleeding SKIN: No rash or lesion. MUSCULOSKELETAL: No joint pain or arthritis.   NEUROLOGIC: No tingling, numbness, weakness.  PSYCHIATRY: No anxiety or depression.   DRUG ALLERGIES:   Allergies  Allergen Reactions  . Ace Inhibitors Cough  . Amoxicillin     Pt doesn't remember  . Flagyl [Metronidazole] Other (See Comments)    Pt cannot remember  . Gabapentin     headache  . Milk-Related Compounds Nausea And Vomiting  . Namenda [Memantine Hcl]     Pt doesn't remember  . Statins     Pt doesn't remember  . Sulfa Antibiotics Swelling  . Verapamil     Pt doesn't remember    VITALS:  Blood pressure (!) 168/60, pulse 70, temperature 98.4 F (36.9 C), temperature source Oral, resp. rate 18, height 4\' 11"  (1.499 m), weight 73.9 kg, SpO2 97 %.  PHYSICAL EXAMINATION:  GENERAL:  73 y.o.-year-old patient lying in the bed with no acute distress.  EYES: Pupils equal, round, reactive to light and accommodation.  No scleral icterus. Extraocular muscles intact.  HEENT: Head atraumatic, normocephalic. Oropharynx and nasopharynx clear.  NECK:  Supple, no jugular venous distention. No thyroid enlargement, no tenderness.  LUNGS: Normal breath sounds bilaterally, no wheezing, rales,rhonchi or crepitation. No use of accessory muscles of respiration.  CARDIOVASCULAR: S1, S2 normal. No murmurs, rubs, or gallops.  ABDOMEN: Soft, nontender, nondistended.  Bowel sounds diminished. No organomegaly or mass.  EXTREMITIES: No pedal edema, cyanosis, or clubbing.  NEUROLOGIC: Cranial nerves II through XII are intact. Muscle strength 5/5 in all extremities. Sensation intact. Gait not checked.  PSYCHIATRIC: The patient is alert and oriented x 3.  SKIN: No obvious rash, lesion, or ulcer.    LABORATORY PANEL:   CBC Recent Labs  Lab 01/04/19 0620  WBC 12.0*  HGB 13.3  HCT 40.7  PLT 245   ------------------------------------------------------------------------------------------------------------------  Chemistries  Recent Labs  Lab 01/04/19 0620  NA 138  K 3.8  CL 108  CO2 24  GLUCOSE 149*  BUN 10  CREATININE 1.01*  CALCIUM 9.0  AST 49*  ALT 30  ALKPHOS 74  BILITOT 1.1   ------------------------------------------------------------------------------------------------------------------  Cardiac Enzymes No results for input(s): TROPONINI in the last 168 hours. ------------------------------------------------------------------------------------------------------------------  RADIOLOGY:  Dg Abd 1 View  Result Date: 01/04/2019 CLINICAL DATA:  NG tube placement EXAM: ABDOMEN - 1 VIEW COMPARISON:  CT earlier today FINDINGS: A nasogastric tube has been placed, tip overlying the level of the stomach. Gaseous distension of visualized bowel loops. Heart size is mildly enlarged.  IMPRESSION: Interval placement of nasogastric tube, tip overlying the level of the stomach. Electronically Signed   By: Nolon Nations M.D.   On: 01/04/2019 12:56   Ct Abdomen Pelvis W Contrast  Result Date: 01/04/2019 CLINICAL DATA:  Abdominal pain and vomiting EXAM: CT ABDOMEN AND PELVIS WITH CONTRAST TECHNIQUE: Multidetector CT imaging of the abdomen and pelvis was performed using the standard protocol following bolus administration of intravenous contrast. CONTRAST:  143mL OMNIPAQUE IOHEXOL 300 MG/ML  SOLN COMPARISON:  07/08/2017 FINDINGS: Lower chest:  Elevated right diaphragm, chronic.  No acute finding Hepatobiliary: Small right lobe cystic densities. There is cholecystectomy with chronic intra and extrahepatic bile duct dilatation. Pancreas: No acute or masslike finding. Spleen: Stable. Adrenals/Urinary Tract: Negative adrenals. No hydronephrosis or stone. Small renal cystic densities. Unremarkable bladder. Stomach/Bowel: Dilated small bowel with mild mesenteric edema above a transition point in the low central abdomen. Segment immediately above the transition point contains fecalized small bowel contents. Bowel dilatation is mild, up to 3 cm. No evidence of bowel necrosis. No underlying mass is seen. Appendectomy. Sigmoid diverticulosis. Vascular/Lymphatic: Diffuse atherosclerotic calcification of the aorta and iliacs. No mass or adenopathy. Reproductive:Hysterectomy. Other: No ascites or pneumoperitoneum. Musculoskeletal: No acute abnormalities. IMPRESSION: Small bowel obstruction with transition in the low pelvis, presumably due to adhesions. Electronically Signed   By: Monte Fantasia M.D.   On: 01/04/2019 04:08    EKG:   Orders placed or performed during the hospital encounter of 01/04/19  . ED EKG  . ED EKG    ASSESSMENT AND PLAN:   #1 abdominal pain on admission secondary to small bowel obstruction, surgery recommended conservative management, patient has been now seen by them, recommended n.p.o., NG tube to suctioning, further management as per the recommendation.  Continue n.p.o., IV hydration for today,  repeat x-ray of abdomen portable  2.  UTI, urine cultures were contaminated on Rocephin, repeat urine cultures, patient denies any dysuria. 3.  Small mass in the back, possible lipoma needs biopsy as an outpatient with PCP.  #4 RLS: Patient is on Requip. 5.  Hypothyroidism: Continue Synthroid 6.  Diabetes mellitus type 2: Patient is n.p.o. so hold Amaryl but continue sliding scale insulin with coverage. 7.  History of Bell's palsy: Patient is on acyclovir 200 mg 5 times daily as per PCP note so we continued.  All the records are reviewed and case discussed with Care Management/Social Workerr. Management plans discussed with the patient, family and they are in agreement.  CODE STATUS: Full code  TOTAL TIME TAKING CARE OF THIS PATIENT: 38 minutes.  More than 50% of time spent in counseling, coordination of care, reviewed medical records from PCP office from 12/15/2018 POSSIBLE D/C IN 2 to 3 days DAYS, DEPENDING ON CLINICAL CONDITION.   Epifanio Lesches M.D on 01/05/2019 at 11:29 AM  Between 7am to 6pm - Pager - [phone]  After 6pm go to [email or web address] - password [email or web address] [facility]  Office  [phone]  CC: Primary care physician; Rusty Aus, MD   Note: This dictation was prepared with Dragon dictation along with smaller phrase technology. Any transcriptional errors that result from this process are unintentional.

## 2019-01-06 ENCOUNTER — Inpatient Hospital Stay: Payer: Medicare HMO

## 2019-01-06 LAB — BASIC METABOLIC PANEL
Anion gap: 12 (ref 5–15)
BUN: 7 mg/dL — ABNORMAL LOW (ref 8–23)
CALCIUM: 8.9 mg/dL (ref 8.9–10.3)
CO2: 18 mmol/L — ABNORMAL LOW (ref 22–32)
CREATININE: 0.79 mg/dL (ref 0.44–1.00)
Chloride: 106 mmol/L (ref 98–111)
GFR calc Af Amer: >60 mL/min (ref >60)
GFR calc non Af Amer: >60 mL/min (ref >60)
Glucose, Bld: 84 mg/dL (ref 70–99)
Potassium: 2.8 mmol/L — ABNORMAL LOW (ref 3.5–5.1)
Sodium: 136 mmol/L (ref 135–145)

## 2019-01-06 LAB — GLUCOSE, CAPILLARY
Glucose-Capillary: 85 mg/dL (ref 70–99)
Glucose-Capillary: 72 mg/dL (ref 70–99)
Glucose-Capillary: 101 mg/dL — ABNORMAL HIGH (ref 70–99)
Glucose-Capillary: 105 mg/dL — ABNORMAL HIGH (ref 70–99)

## 2019-01-06 LAB — URINE CULTURE: Culture: NO GROWTH

## 2019-01-06 MED ORDER — HYDRALAZINE HCL 20 MG/ML IJ SOLN
10.0000 mg | INTRAMUSCULAR | Status: DC | PRN
  Administered 2019-01-06 – 2019-01-07 (×2): 10 mg via INTRAVENOUS
  Filled 2019-01-06 (×2): qty 1

## 2019-01-06 MED ORDER — POTASSIUM CHLORIDE 20 MEQ PO PACK
20.0000 meq | PACK | ORAL | Status: AC
  Administered 2019-01-06 (×2): 20 meq via ORAL
  Filled 2019-01-06 (×2): qty 1

## 2019-01-06 MED ORDER — POTASSIUM CHLORIDE 2 MEQ/ML IV SOLN
INTRAVENOUS | Status: DC
  Administered 2019-01-06 – 2019-01-07 (×3): via INTRAVENOUS
  Filled 2019-01-06 (×5): qty 1000

## 2019-01-06 MED ORDER — POTASSIUM CHLORIDE 10 MEQ/100ML IV SOLN
10.0000 meq | INTRAVENOUS | Status: DC
  Administered 2019-01-06: 10 meq via INTRAVENOUS
  Filled 2019-01-06: qty 100

## 2019-01-06 NOTE — Progress Notes (Addendum)
[facility] SURGICAL PROGRESS NOTE (cpt [phone])  Hospital Day(s): 2.   Post op day(s):  Marland Kitchen   Interval History: Patient seen and examined, no acute events or new complaints overnight. Patient reports that she has had improvement in her abdominal discomfort and is without fever, chills, nausea, emesis, CP, or SOB. This morning, she is unsure about passing flatus and states "it may have been when I was at home." No reports of flatus this morning.   Review of Systems:  Constitutional: denies fever, chills  Respiratory: denies any shortness of breath  Cardiovascular: denies chest pain or palpitations  Gastrointestinal: denies abdominal pain, N/V, or diarrhea/and bowel function as per interval history Genitourinary: denies burning with urination or urinary frequency   Vital signs in last 24 hours: [min-max] current  Temp:  [98.1 F (36.7 C)-98.4 F (36.9 C)] 98.3 F (36.8 C) (02/20 0500) Pulse Rate:  [70-84] 84 (02/20 0618) Resp:  [18-20] 18 (02/20 0500) BP: (158-186)/(60-70) 186/69 (02/20 0618) SpO2:  [96 %-99 %] 98 % (02/20 0618)     Height: 4\' 11"  (149.9 cm) Weight: 73.9 kg BMI (Calculated): 32.89   Intake/Output this shift:  No intake/output data recorded.   Intake/Output last 2 shifts:  @IOLAST2SHIFTS @   Physical Exam:  Constitutional: alert, cooperative and no distress  HENT: normocephalic without obvious abnormality, NGT in place  Eyes: EOM's grossly intact and symmetric  Respiratory: breathing non-labored at rest  Gastrointestinal: soft, non-tender, and non-distended, no rebound/guarding Musculoskeletal: no edema or wounds, motor and sensation grossly intact, NT    Labs:  CBC Latest Ref Rng & Units 01/05/2019 01/04/2019 01/04/2019  WBC 4.0 - 10.5 K/uL - 12.0(H) 14.5(H)  Hemoglobin 12.0 - 15.0 g/dL 12.8 13.3 14.9  Hematocrit 36.0 - 46.0 % - 40.7 45.4  Platelets 150 - 400 K/uL - 245 253   CMP Latest Ref Rng & Units 01/04/2019 01/04/2019 07/08/2017  Glucose  70 - 99 mg/dL 149(H) 156(H) 258(H)  BUN 8 - 23 mg/dL 10 10 18   Creatinine 0.44 - 1.00 mg/dL 1.01(H) 1.04(H) 1.05(H)  Sodium 135 - 145 mmol/L 138 140 139  Potassium 3.5 - 5.1 mmol/L 3.8 3.5 3.3(L)  Chloride 98 - 111 mmol/L 108 108 104  CO2 22 - 32 mmol/L 24 23 26   Calcium 8.9 - 10.3 mg/dL 9.0 9.5 9.5  Total Protein 6.5 - 8.1 g/dL 7.3 8.0 -  Total Bilirubin 0.3 - 1.2 mg/dL 1.1 1.0 -  Alkaline Phos 38 - 126 U/L 74 81 -  AST 15 - 41 U/L 49(H) 26 -  ALT 0 - 44 U/L 30 25 -    Imaging studies: No new pertinent imaging studies   Assessment/Plan: (ICD-10's: K56.51) 73 y.o.femalewith stable but not clinically improving partial small bowel obstruction likely attributable to post-surgical adhesions (abdominal hysterectomy), complicated by pertinent comorbidities includingadvanced chronological age, DM, HTN, hypercholesterolemia, COPD, CAD s/p PCI, PAD, history of stroke, CKD, asthma, OSA, hypothyroidism, hiatal hernia with GERD and history of gastritis, dementia, history of skin cancer, and major depression disorder   - Continue NGT decompression while awaiting return of bowel function   - NPO, IVF   - Pain control prn (minimize narcotics), Antiemetics prn              - Continue to monitor abdominal examination, ongoing bowel function, NGT output              - surgical intervention if doesn't improve was also discussed, no indication for emergent intervention currently   -  Will assume primary care from hospitalist service, appreciate their help  - DVT prophylaxis   All of the above findings and recommendations were discussed with the patient, and the medical team, and all of patient's questions were answered to her expressed satisfaction.  -- Edison Simon, PA-C [facility] 01/06/2019, 9:37 AM [phone] M-F: 7am - 4pm

## 2019-01-06 NOTE — Progress Notes (Addendum)
[facility] [place] NAME: Sylwia Cuervo    MR#:  [id number]  DATE OF BIRTH:  Apr 05, 1946  SUBJECTIVE: Patient admitted for small bowel obstruction, NG tube was placed yesterday as per surgery recommendation, patient states that she has no abdominal pain, after passing small amounts of gas this morning.  She denies any complaints.  Her only complaint is throat discomfort because of NG tube.  CHIEF COMPLAINT:   Patient complains of high blood pressure overnight-we will restart home antihypertensive regiment, no events overnight per nursing staff, discussed with general surgery-Dr. Rosana Hoes to take over as primary  REVIEW OF SYSTEMS:   ROS CONSTITUTIONAL: No fever, fatigue or weakness.  EYES: No blurred or double vision.  EARS, NOSE, AND THROAT: No tinnitus or ear pain.  RESPIRATORY: No cough, shortness of breath, wheezing or hemoptysis.  CARDIOVASCULAR: No chest pain, orthopnea, edema.  GASTROINTESTINAL: No nausea, vomiting, diarrhea or abdominal pain.  GENITOURINARY: No dysuria, hematuria.  ENDOCRINE: No polyuria, nocturia,  HEMATOLOGY: No anemia, easy bruising or bleeding SKIN: No rash or lesion. MUSCULOSKELETAL: No joint pain or arthritis.   NEUROLOGIC: No tingling, numbness, weakness.  PSYCHIATRY: No anxiety or depression.   DRUG ALLERGIES:   Allergies  Allergen Reactions  . Ace Inhibitors Cough  . Amoxicillin     Pt doesn't remember  . Flagyl [Metronidazole] Other (See Comments)    Pt cannot remember  . Gabapentin     headache  . Milk-Related Compounds Nausea And Vomiting  . Namenda [Memantine Hcl]     Pt doesn't remember  . Statins     Pt doesn't remember  . Sulfa Antibiotics Swelling  . Verapamil     Pt doesn't remember    VITALS:  Blood pressure (!) 186/69, pulse 84, temperature 98.3 F (36.8 C), temperature source Oral, resp. rate 18, height 4\' 11"  (1.499 m), weight 73.9 kg, SpO2 98 %.  PHYSICAL EXAMINATION:   GENERAL:  73 y.o.-year-old patient lying in the bed with no acute distress.  EYES: Pupils equal, round, reactive to light and accommodation. No scleral icterus. Extraocular muscles intact.  HEENT: Head atraumatic, normocephalic. Oropharynx and nasopharynx clear.  NECK:  Supple, no jugular venous distention. No thyroid enlargement, no tenderness.  LUNGS: Normal breath sounds bilaterally, no wheezing, rales,rhonchi or crepitation. No use of accessory muscles of respiration.  CARDIOVASCULAR: S1, S2 normal. No murmurs, rubs, or gallops.  ABDOMEN: Soft, nontender, nondistended.  Bowel sounds diminished. No organomegaly or mass.  EXTREMITIES: No pedal edema, cyanosis, or clubbing.  NEUROLOGIC: Cranial nerves II through XII are intact. Muscle strength 5/5 in all extremities. Sensation intact. Gait not checked.  PSYCHIATRIC: The patient is alert and oriented x 3.  SKIN: No obvious rash, lesion, or ulcer.    LABORATORY PANEL:   CBC Recent Labs  Lab 01/04/19 0620 01/05/19 2248  WBC 12.0*  --   HGB 13.3 12.8  HCT 40.7  --   PLT 245  --    ------------------------------------------------------------------------------------------------------------------  Chemistries  Recent Labs  Lab 01/04/19 0620 01/06/19 0907  NA 138 136  K 3.8 2.8*  CL 108 106  CO2 24 18*  GLUCOSE 149* 84  BUN 10 7*  CREATININE 1.01* 0.79  CALCIUM 9.0 8.9  AST 49*  --   ALT 30  --   ALKPHOS 74  --   BILITOT 1.1  --    ------------------------------------------------------------------------------------------------------------------  Cardiac Enzymes No results for input(s): TROPONINI in the last 168 hours. ------------------------------------------------------------------------------------------------------------------  RADIOLOGY:  Dg Abd 1 View  Result Date: 01/04/2019 CLINICAL DATA:  NG tube placement EXAM: ABDOMEN - 1 VIEW COMPARISON:  CT earlier today FINDINGS: A nasogastric tube has been placed, tip  overlying the level of the stomach. Gaseous distension of visualized bowel loops. Heart size is mildly enlarged. IMPRESSION: Interval placement of nasogastric tube, tip overlying the level of the stomach. Electronically Signed   By: Nolon Nations M.D.   On: 01/04/2019 12:56    EKG:   Orders placed or performed during the hospital encounter of 01/04/19  . ED EKG  . ED EKG  . EKG    ASSESSMENT AND PLAN:  *Acute abdominal pain  Suspected due to  SBO Conservative management per general surgery-NG tube for decompression, bowel rest, and discussion with general surgery-for possible NG tube removal later today   *Acute UTI  Continue empiric Rocephin, follow-up on cultures   *Small mass in the back possible lipoma needs biopsy as an outpatient with PCP  *RLS Stable Continue Requip  *Hypothyroidism Continue Synthroid  *Diabetes mellitus type 2 Controlled on current regimen  *History of Bell's palsy Stable  Continue acyclovir 200 mg 5 times daily as per PCP   All the records are reviewed and case discussed with Care Management/Social Workerr. Management plans discussed with the patient, family and they are in agreement.  CODE STATUS: Full code  TOTAL TIME TAKING CARE OF THIS PATIENT: 35 minutes.  More than 50% of time spent in counseling, coordination of care, reviewed medical records from PCP office from 12/15/2018 POSSIBLE D/C IN 1 to 2 days DAYS, DEPENDING ON CLINICAL CONDITION.   Avel Peace Salary M.D on 01/06/2019 at 11:31 AM  Between 7am to 6pm - Pager - [phone]  After 6pm go to [email or web address] - password [email or web address] [facility]  Office  [phone]  CC: Primary care physician; Rusty Aus, MD   Note: This dictation was prepared with Dragon dictation along with smaller phrase technology. Any transcriptional errors that result from this process are unintentional.

## 2019-01-07 LAB — BASIC METABOLIC PANEL
Anion gap: 11 (ref 5–15)
BUN: 8 mg/dL (ref 8–23)
CO2: 20 mmol/L — ABNORMAL LOW (ref 22–32)
Calcium: 9.4 mg/dL (ref 8.9–10.3)
Chloride: 108 mmol/L (ref 98–111)
Creatinine, Ser: 0.71 mg/dL (ref 0.44–1.00)
GFR calc Af Amer: >60 mL/min (ref >60)
Glucose, Bld: 113 mg/dL — ABNORMAL HIGH (ref 70–99)
Potassium: 3.1 mmol/L — ABNORMAL LOW (ref 3.5–5.1)
Sodium: 139 mmol/L (ref 135–145)

## 2019-01-07 LAB — CBC
HCT: 40.9 % (ref 36.0–46.0)
Hemoglobin: 13.5 g/dL (ref 12.0–15.0)
MCH: 28.7 pg (ref 26.0–34.0)
MCHC: 33 g/dL (ref 30.0–36.0)
MCV: 87 fL (ref 80.0–100.0)
PLATELETS: 207 10*3/uL (ref 150–400)
RBC: 4.7 MIL/uL (ref 3.87–5.11)
RDW: 13.2 % (ref 11.5–15.5)
WBC: 7.8 10*3/uL (ref 4.0–10.5)
nRBC: 0 % (ref 0.0–0.2)

## 2019-01-07 LAB — GLUCOSE, CAPILLARY
Glucose-Capillary: 122 mg/dL — ABNORMAL HIGH (ref 70–99)
Glucose-Capillary: 134 mg/dL — ABNORMAL HIGH (ref 70–99)
Glucose-Capillary: 182 mg/dL — ABNORMAL HIGH (ref 70–99)
Glucose-Capillary: 117 mg/dL — ABNORMAL HIGH (ref 70–99)

## 2019-01-07 LAB — MAGNESIUM: Magnesium: 2 mg/dL (ref 1.7–2.4)

## 2019-01-07 MED ORDER — FAMOTIDINE 20 MG PO TABS
20.0000 mg | ORAL_TABLET | Freq: Every day | ORAL | Status: DC
  Administered 2019-01-07: 20 mg via ORAL
  Filled 2019-01-07: qty 1

## 2019-01-07 MED ORDER — BOOST / RESOURCE BREEZE PO LIQD CUSTOM
1.0000 | Freq: Three times a day (TID) | ORAL | Status: DC
  Administered 2019-01-07 (×2): 1 via ORAL

## 2019-01-07 MED ORDER — KCL IN DEXTROSE-NACL 20-5-0.45 MEQ/L-%-% IV SOLN
INTRAVENOUS | Status: DC
  Administered 2019-01-07 – 2019-01-08 (×2): via INTRAVENOUS
  Filled 2019-01-07 (×5): qty 1000

## 2019-01-07 MED ORDER — METOPROLOL SUCCINATE ER 50 MG PO TB24
50.0000 mg | ORAL_TABLET | Freq: Every day | ORAL | Status: DC
  Administered 2019-01-07 – 2019-01-08 (×2): 50 mg via ORAL
  Filled 2019-01-07 (×2): qty 1

## 2019-01-07 MED ORDER — PANTOPRAZOLE SODIUM 40 MG PO TBEC
40.0000 mg | DELAYED_RELEASE_TABLET | Freq: Every day | ORAL | Status: DC
  Administered 2019-01-08: 40 mg via ORAL
  Filled 2019-01-07: qty 1

## 2019-01-07 MED ORDER — POTASSIUM CHLORIDE 20 MEQ PO PACK
40.0000 meq | PACK | Freq: Once | ORAL | Status: AC
  Administered 2019-01-07: 40 meq via ORAL
  Filled 2019-01-07: qty 2

## 2019-01-07 MED ORDER — SODIUM BICARBONATE 650 MG PO TABS
650.0000 mg | ORAL_TABLET | Freq: Three times a day (TID) | ORAL | Status: AC
  Administered 2019-01-07 – 2019-01-08 (×4): 650 mg via ORAL
  Filled 2019-01-07 (×4): qty 1

## 2019-01-07 NOTE — Care Management Important Message (Signed)
Copy of signed Medicare IM left with patient in room. 

## 2019-01-07 NOTE — Progress Notes (Signed)
[facility] SURGICAL PROGRESS NOTE (cpt [phone])  Hospital Day(s): 3.   Post op day(s):  Marland Kitchen   Interval History: Patient seen and examined, no acute events or new complaints overnight. Patient reports that she denied any abdominal pain, nausea, or emesis. She does continue to endorse flatus without BM. No other additional complaints. NGT with 300 ccs out.   Review of Systems:  Constitutional: denies fever, chills  Respiratory: denies any shortness of breath  Cardiovascular: denies chest pain or palpitations  Gastrointestinal: denies abdominal pain, N/V, or diarrhea/and bowel function as per interval history Genitourinary: denies burning with urination or urinary frequency   Vital signs in last 24 hours: [min-max] current  Temp:  [98.1 F (36.7 C)-98.6 F (37 C)] 98.1 F (36.7 C) (02/21 0438) Pulse Rate:  [75-89] 89 (02/21 0611) Resp:  [16-20] 20 (02/21 0438) BP: (160-192)/(68-94) 166/70 (02/21 0611) SpO2:  [97 %-99 %] 97 % (02/21 0438) Weight:  [70.7 kg] 70.7 kg (02/21 0438)     Height: 4\' 11"  (149.9 cm) Weight: 70.7 kg BMI (Calculated): 31.47   Intake/Output this shift:  No intake/output data recorded.    Physical Exam:  Constitutional: alert, cooperative and no distress  HENT: normocephalic without obvious abnormality, NGT in place Eyes: EOM's grossly intact and symmetric  Respiratory: breathing non-labored at rest  Gastrointestinal: soft, non-tender, and non-distended, no rebound/guarding Musculoskeletal: no edema or wounds, motor and sensation grossly intact, NT    Labs:  CBC Latest Ref Rng & Units 01/07/2019 01/05/2019 01/04/2019  WBC 4.0 - 10.5 K/uL 7.8 - 12.0(H)  Hemoglobin 12.0 - 15.0 g/dL 13.5 12.8 13.3  Hematocrit 36.0 - 46.0 % 40.9 - 40.7  Platelets 150 - 400 K/uL 207 - 245   CMP Latest Ref Rng & Units 01/07/2019 01/06/2019 01/04/2019  Glucose 70 - 99 mg/dL 113(H) 84 149(H)  BUN 8 - 23 mg/dL 8 7(L) 10  Creatinine 0.44 - 1.00 mg/dL 0.71 0.79  1.01(H)  Sodium 135 - 145 mmol/L 139 136 138  Potassium 3.5 - 5.1 mmol/L 3.1(L) 2.8(L) 3.8  Chloride 98 - 111 mmol/L 108 106 108  CO2 22 - 32 mmol/L 20(L) 18(L) 24  Calcium 8.9 - 10.3 mg/dL 9.4 8.9 9.0  Total Protein 6.5 - 8.1 g/dL - - 7.3  Total Bilirubin 0.3 - 1.2 mg/dL - - 1.1  Alkaline Phos 38 - 126 U/L - - 74  AST 15 - 41 U/L - - 49(H)  ALT 0 - 44 U/L - - 30    Imaging studies: No new pertinent imaging studies   Assessment/Plan: (ICD-10's: K56.51) 72 y.o.femalewith clinically improvingpartial small bowel obstruction likely attributable to post-surgical adhesions (abdominal hysterectomy), complicated by pertinent comorbidities includingadvanced chronological age, DM, HTN, hypercholesterolemia, COPD, CAD s/p PCI, PAD, history of stroke, CKD, asthma, OSA, hypothyroidism, hiatal hernia with GERD and history of gastritis, dementia, history of skin cancer, and major depression disorder   - NGT out today   - Start on clears + nutritional supplementation, IVF   - Pain control prn (minimize narcotics), Antiemetics prn - Continue to monitor abdominal examination, ongoing bowel function, NGT output - surgical intervention if doesn't improve was also discussed, no indication for emergent intervention currently              - Medical management of comorbidities, appreciate hospitalist help             - DVT prophylaxis   All of the above findings and recommendations were discussed with the patient, and the  medical team, and all of patient's questions were answered to her expressed satisfaction.  -- Edison Simon, PA-C [facility] 01/07/2019, 9:00 AM [phone] M-F: 7am - 4pm

## 2019-01-07 NOTE — Progress Notes (Addendum)
[place] [facility] [place] NAME: Cathy Hines    MR#:  [id number]  DATE OF BIRTH:  05/15/1946    CHIEF COMPLAINT:   Patient feeling better, case discussed with Dr. Loran Senters service, left upper extremity ultrasound negative for DVT, replete potassium, NG tube to be discontinued later today, increase beta-blocker therapy given uncontrolled blood pressures  REVIEW OF SYSTEMS:   ROS CONSTITUTIONAL: No fever, fatigue or weakness.  EYES: No blurred or double vision.  EARS, NOSE, AND THROAT: No tinnitus or ear pain.  RESPIRATORY: No cough, shortness of breath, wheezing or hemoptysis.  CARDIOVASCULAR: No chest pain, orthopnea, edema.  GASTROINTESTINAL: No nausea, vomiting, diarrhea or abdominal pain.  GENITOURINARY: No dysuria, hematuria.  ENDOCRINE: No polyuria, nocturia,  HEMATOLOGY: No anemia, easy bruising or bleeding SKIN: No rash or lesion. MUSCULOSKELETAL: No joint pain or arthritis.   NEUROLOGIC: No tingling, numbness, weakness.  PSYCHIATRY: No anxiety or depression.   DRUG ALLERGIES:   Allergies  Allergen Reactions  . Ace Inhibitors Cough  . Amoxicillin     Pt doesn't remember  . Flagyl [Metronidazole] Other (See Comments)    Pt cannot remember  . Gabapentin     headache  . Milk-Related Compounds Nausea And Vomiting  . Namenda [Memantine Hcl]     Pt doesn't remember  . Statins     Pt doesn't remember  . Sulfa Antibiotics Swelling  . Verapamil     Pt doesn't remember    VITALS:  Blood pressure (!) 166/70, pulse 89, temperature 98.1 F (36.7 C), temperature source Oral, resp. rate 20, height 4\' 11"  (1.499 m), weight 70.7 kg, SpO2 97 %.  PHYSICAL EXAMINATION:  GENERAL:  73 y.o.-year-old patient lying in the bed with no acute distress.  EYES: Pupils equal, round, reactive to light and accommodation. No scleral icterus. Extraocular muscles intact.  HEENT: Head atraumatic, normocephalic. Oropharynx and nasopharynx  clear.  NECK:  Supple, no jugular venous distention. No thyroid enlargement, no tenderness.  LUNGS: Normal breath sounds bilaterally, no wheezing, rales,rhonchi or crepitation. No use of accessory muscles of respiration.  CARDIOVASCULAR: S1, S2 normal. No murmurs, rubs, or gallops.  ABDOMEN: Soft, nontender, nondistended.  Bowel sounds diminished. No organomegaly or mass.  EXTREMITIES: No pedal edema, cyanosis, or clubbing.  NEUROLOGIC: Cranial nerves II through XII are intact. Muscle strength 5/5 in all extremities. Sensation intact. Gait not checked.  PSYCHIATRIC: The patient is alert and oriented x 3.  SKIN: No obvious rash, lesion, or ulcer.    LABORATORY PANEL:   CBC Recent Labs  Lab 01/07/19 0646  WBC 7.8  HGB 13.5  HCT 40.9  PLT 207   ------------------------------------------------------------------------------------------------------------------  Chemistries  Recent Labs  Lab 01/04/19 0620  01/07/19 0646  NA 138   < > 139  K 3.8   < > 3.1*  CL 108   < > 108  CO2 24   < > 20*  GLUCOSE 149*   < > 113*  BUN 10   < > 8  CREATININE 1.01*   < > 0.71  CALCIUM 9.0   < > 9.4  MG  --   --  2.0  AST 49*  --   --   ALT 30  --   --   ALKPHOS 74  --   --   BILITOT 1.1  --   --    < > = values in this interval not displayed.   ------------------------------------------------------------------------------------------------------------------  Cardiac Enzymes No results for  input(s): TROPONINI in the last 168 hours. ------------------------------------------------------------------------------------------------------------------  RADIOLOGY:  US Venous Img Upper Uni Left  Result Date: 01/06/2019 CLINICAL DATA:  73 year old female with a history of arm swelling EXAM: LEFT UPPER EXTREMITY VENOUS DOPPLER ULTRASOUND TECHNIQUE: Gray-scale sonography with graded compression, as well as color Doppler and duplex ultrasound were performed to evaluate the upper extremity deep venous  system from the level of the subclavian vein and including the jugular, axillary, basilic, radial, ulnar and upper cephalic vein. Spectral Doppler was utilized to evaluate flow at rest and with distal augmentation maneuvers. COMPARISON:  None. FINDINGS: Contralateral Subclavian Vein: Respiratory phasicity is normal and symmetric with the symptomatic side. No evidence of thrombus. Normal compressibility. Internal Jugular Vein: No evidence of thrombus. Normal compressibility, respiratory phasicity and response to augmentation. Subclavian Vein: No evidence of thrombus. Normal compressibility, respiratory phasicity and response to augmentation. Axillary Vein: No evidence of thrombus. Normal compressibility, respiratory phasicity and response to augmentation. Cephalic Vein: No evidence of thrombus. Normal compressibility, respiratory phasicity and response to augmentation. Basilic Vein: No evidence of thrombus. Normal compressibility, respiratory phasicity and response to augmentation. Brachial Veins: No evidence of thrombus. Normal compressibility, respiratory phasicity and response to augmentation. Radial Veins: No evidence of thrombus. Normal compressibility, respiratory phasicity and response to augmentation. Ulnar Veins: No evidence of thrombus. Normal compressibility, respiratory phasicity and response to augmentation. Other Findings:  None visualized. IMPRESSION: Sonographic survey of the left upper extremity negative for DVT Electronically Signed   By: Corrie Mckusick D.O.   On: 01/06/2019 12:54    EKG:   Orders placed or performed during the hospital encounter of 01/04/19  . ED EKG  . ED EKG  . EKG    ASSESSMENT AND PLAN:  *Acute abdominal pain  Resolved Suspected due to  SBO Conservative management per general surgery-NG tube for removal later today and discussion with Dr. Loran Senters service   *Chronic benign essential hypertension Uncontrolled Increase beta-blocker therapy, vitals per  routine, make changes as per necessary  *Acute hypokalemia Replete with p.o. potassium, magnesium levels normal, BMP in the morning  *Acute UTI  Ruled out Discontinue antibiotics   *Small mass in the back possible lipoma needs biopsy as an outpatient with PCP  *RLS Stable Continue Requip  *Hypothyroidism Continue Synthroid  *Diabetes mellitus type 2 Controlled on current regimen  *History of Bell's palsy Stable  Continue acyclovir 200 mg 5 times daily as per PCP   All the records are reviewed and case discussed with Care Management/Social Workerr. Management plans discussed with the patient, family and they are in agreement.  CODE STATUS: Full code  TOTAL TIME TAKING CARE OF THIS PATIENT: 35 minutes.  More than 50% of time spent in counseling, coordination of care, reviewed medical records from PCP office from 12/15/2018 POSSIBLE D/C IN 1 to 2 days DAYS, DEPENDING ON CLINICAL CONDITION.   Avel Peace  M.D on 01/07/2019 at 12:33 PM  Between 7am to 6pm - Pager - [phone]  After 6pm go to [email or web address] - password [email or web address] [facility]  Office  [phone]  CC: Primary care physician; Rusty Aus, MD   Note: This dictation was prepared with Dragon dictation along with smaller phrase technology. Any transcriptional errors that result from this process are unintentional.

## 2019-01-08 LAB — BASIC METABOLIC PANEL
Anion gap: 7 (ref 5–15)
BUN: 7 mg/dL — ABNORMAL LOW (ref 8–23)
CALCIUM: 9.5 mg/dL (ref 8.9–10.3)
CO2: 23 mmol/L (ref 22–32)
CREATININE: 0.76 mg/dL (ref 0.44–1.00)
Chloride: 109 mmol/L (ref 98–111)
GFR calc Af Amer: >60 mL/min (ref >60)
GFR calc non Af Amer: >60 mL/min (ref >60)
Glucose, Bld: 135 mg/dL — ABNORMAL HIGH (ref 70–99)
Potassium: 3.5 mmol/L (ref 3.5–5.1)
Sodium: 139 mmol/L (ref 135–145)

## 2019-01-08 LAB — GLUCOSE, CAPILLARY: Glucose-Capillary: 144 mg/dL — ABNORMAL HIGH (ref 70–99)

## 2019-01-08 MED ORDER — BISACODYL 10 MG RE SUPP
10.0000 mg | Freq: Once | RECTAL | Status: AC
  Administered 2019-01-08: 10 mg via RECTAL
  Filled 2019-01-08: qty 1

## 2019-01-08 MED ORDER — DOCUSATE SODIUM 100 MG PO CAPS: 100.0000 mg | ORAL_CAPSULE | Freq: Every day | ORAL | 0 refills | Status: AC | PRN

## 2019-01-08 MED ORDER — POLYETHYLENE GLYCOL 3350 17 G PO PACK
17.0000 g | PACK | Freq: Every day | ORAL | Status: DC
  Administered 2019-01-08: 17 g via ORAL
  Filled 2019-01-08: qty 1

## 2019-01-08 MED ORDER — POLYETHYLENE GLYCOL 3350 17 G PO PACK: 17.0000 g | PACK | Freq: Every day | ORAL | 0 refills | Status: DC

## 2019-01-08 NOTE — Discharge Instructions (Signed)
°  Diet: Resume home heart healthy regular diet.   Activity: Increase activity as tolerated. Light activity and walking are encouraged. Do not drive or drink alcohol if taking narcotic pain medications.  Medications: Resume all home medications   If develops recurrent nausea, vomiting and/or abdominal pain.  Please return to the ED for further evaluation.

## 2019-01-08 NOTE — Discharge Summary (Addendum)
Patient ID: Cathy Hines MRN: [id number] DOB/AGE: Feb 05, 1946 73 y.o.  Admit date: 01/04/2019 Discharge date: 01/08/2019   Discharge Diagnoses:  Active Problems:   UTI (urinary tract infection) Small bowel obstruction  Procedures: None  Hospital Course: Patient was admitted with small bowel obstruction with suspected urinary tract infection.  She was treated for the UTI and with nasogastric tube.  Patient started passing flatus and nasogastric tube was removed.  Diet was advanced as tolerated.  Today patient ate soft diet and tolerated continue passing gas and bowel movement.  There is no abdominal pain.  Physical Exam  Constitutional: She is oriented to person, place, and time and well-developed, well-nourished, and in no distress.  Cardiovascular: Normal rate and regular rhythm.  Pulmonary/Chest: Effort normal.  Abdominal: Soft. She exhibits no distension. There is no abdominal tenderness. There is no rebound and no guarding.  Neurological: She is alert and oriented to person, place, and time. Gait normal.     Consults: Hospitalist  Disposition: Discharge disposition: 01-Home or Self Care       Discharge Instructions    Diet - low sodium heart healthy   Complete by:  As directed    Diet - low sodium heart healthy   Complete by:  As directed    Increase activity slowly   Complete by:  As directed    Increase activity slowly   Complete by:  As directed      Allergies as of 01/08/2019      Reactions   Ace Inhibitors Cough   Amoxicillin    Pt doesn't remember   Flagyl [metronidazole] Other (See Comments)   Pt cannot remember   Gabapentin    headache   Milk-related Compounds Nausea And Vomiting   Namenda [memantine Hcl]    Pt doesn't remember   Statins    Pt doesn't remember   Sulfa Antibiotics Swelling   Verapamil    Pt doesn't remember      Medication List    STOP taking these medications   erythromycin ophthalmic ointment     TAKE these  medications   acyclovir 200 MG capsule Commonly known as:  ZOVIRAX Take 200 mg by mouth 5 (five) times daily.   albuterol 108 (90 Base) MCG/ACT inhaler Commonly known as:  PROVENTIL HFA;VENTOLIN HFA Inhale into the lungs every 6 (six) hours as needed for wheezing or shortness of breath.   amLODipine 5 MG tablet Commonly known as:  NORVASC Take 5 mg by mouth daily.   BIOTIN PO Take by mouth daily.   docusate sodium 100 MG capsule Commonly known as:  COLACE Take 1 capsule (100 mg total) by mouth daily as needed for mild constipation.   folic acid 1 MG tablet Commonly known as:  FOLVITE Take 1 tablet by mouth daily.   glimepiride 2 MG tablet Commonly known as:  AMARYL Take 2 mg by mouth daily with breakfast.   hydrochlorothiazide 25 MG tablet Commonly known as:  HYDRODIURIL Take 25 mg by mouth daily.   levothyroxine 75 MCG tablet Commonly known as:  SYNTHROID, LEVOTHROID Take 75 mcg by mouth daily before breakfast.   Magnesium 500 MG Tabs Take 1,000 mg by mouth daily.   metoprolol succinate 25 MG 24 hr tablet Commonly known as:  TOPROL-XL Take 25 mg by mouth daily.   polyethylene glycol packet Commonly known as:  MIRALAX / GLYCOLAX Take 17 g by mouth daily.   potassium chloride 10 MEQ tablet Commonly known as:  K-DUR Take 10 mEq  by mouth daily.   pramipexole 0.125 MG tablet Commonly known as:  MIRAPEX Take 0.125 mg by mouth 3 (three) times daily.   traMADol 50 MG tablet Commonly known as:  ULTRAM Take by mouth every 6 (six) hours as needed.   Vitamin B12 1000 MCG Tbcr Take 1 tablet by mouth daily.      Follow-up Information    Vickie Epley, MD Follow up.   Specialty:  General Surgery Why:  Does not need to follow up, this is to provide our contact information  Contact information: [place] [address] [phone]        Rusty Aus, MD Follow up in 6 day(s).   Specialty:  Internal Medicine Contact  information: [place] [address] [phone]          This was more than 30-minute encounter.  Most of the time arranging patient about her condition and short and long-term care.

## 2019-01-08 NOTE — Progress Notes (Signed)
[place] at [place] NAME: Cathy Hines    MR#:  [id number]  DATE OF BIRTH:  [birth year]    CHIEF COMPLAINT:   Patient denies any complaints she states that she feels gassy  REVIEW OF SYSTEMS:   ROS CONSTITUTIONAL: No fever, fatigue or weakness.  EYES: No blurred or double vision.  EARS, NOSE, AND THROAT: No tinnitus or ear pain.  RESPIRATORY: No cough, shortness of breath, wheezing or hemoptysis.  CARDIOVASCULAR: No chest pain, orthopnea, edema.  GASTROINTESTINAL: No nausea, vomiting, diarrhea or abdominal pain.  GENITOURINARY: No dysuria, hematuria.  ENDOCRINE: No polyuria, nocturia,  HEMATOLOGY: No anemia, easy bruising or bleeding SKIN: No rash or lesion. MUSCULOSKELETAL: No joint pain or arthritis.   NEUROLOGIC: No tingling, numbness, weakness.  PSYCHIATRY: No anxiety or depression.   DRUG ALLERGIES:   Allergies  Allergen Reactions  . Ace Inhibitors Cough  . Amoxicillin     Pt doesn't remember  . Flagyl [Metronidazole] Other (See Comments)    Pt cannot remember  . Gabapentin     headache  . Milk-Related Compounds Nausea And Vomiting  . Namenda [Memantine Hcl]     Pt doesn't remember  . Statins     Pt doesn't remember  . Sulfa Antibiotics Swelling  . Verapamil     Pt doesn't remember    VITALS:  Blood pressure (!) 157/62, pulse 64, temperature (!) 97.4 F (36.3 C), temperature source Oral, resp. rate 16, height 4\' 11"  (1.499 m), weight 70.9 kg, SpO2 98 %.  PHYSICAL EXAMINATION:  GENERAL:  73 y.o.-year-old patient lying in the bed with no acute distress.  EYES: Pupils equal, round, reactive to light and accommodation. No scleral icterus. Extraocular muscles intact.  HEENT: Head atraumatic, normocephalic. Oropharynx and nasopharynx clear.  NECK:  Supple, no jugular venous distention. No thyroid enlargement, no tenderness.  LUNGS: Normal breath sounds bilaterally, no wheezing, rales,rhonchi or crepitation. No  use of accessory muscles of respiration.  CARDIOVASCULAR: S1, S2 normal. No murmurs, rubs, or gallops.  ABDOMEN: Soft, nontender, nondistended.  Bowel sounds diminished. No organomegaly or mass.  EXTREMITIES: No pedal edema, cyanosis, or clubbing.  NEUROLOGIC: Cranial nerves II through XII are intact. Muscle strength 5/5 in all extremities. Sensation intact. Gait not checked.  PSYCHIATRIC: The patient is alert and oriented x 3.  SKIN: No obvious rash, lesion, or ulcer.    LABORATORY PANEL:   CBC Recent Labs  Lab 01/07/19 0646  WBC 7.8  HGB 13.5  HCT 40.9  PLT 207   ------------------------------------------------------------------------------------------------------------------  Chemistries  Recent Labs  Lab 01/04/19 0620  01/07/19 0646 01/08/19 0428  NA 138   < > 139 139  K 3.8   < > 3.1* 3.5  CL 108   < > 108 109  CO2 24   < > 20* 23  GLUCOSE 149*   < > 113* 135*  BUN 10   < > 8 7*  CREATININE 1.01*   < > 0.71 0.76  CALCIUM 9.0   < > 9.4 9.5  MG  --   --  2.0  --   AST 49*  --   --   --   ALT 30  --   --   --   ALKPHOS 74  --   --   --   BILITOT 1.1  --   --   --    < > = values in this interval not displayed.   ------------------------------------------------------------------------------------------------------------------  Cardiac  Enzymes No results for input(s): TROPONINI in the last 168 hours. ------------------------------------------------------------------------------------------------------------------  RADIOLOGY:  No results found.  EKG:   Orders placed or performed during the hospital encounter of 01/04/19  . ED EKG  . ED EKG  . EKG    ASSESSMENT AND PLAN:  *Acute abdominal pain  Resolved Suspected due to  SBO Suppository x1 Send a prescription for MiraLAX and Colace on discharge  *Chronic benign essential hypertension Uncontrolled Increase beta-blocker therapy, vitals per routine, make changes as per necessary  *Acute  hypokalemia Replace  *Acute UTI  Ruled out Discontinue antibiotics   *Small mass in the back possible lipoma needs biopsy as an outpatient with PCP  *RLS Stable Continue Requip  *Hypothyroidism Continue Synthroid  *Diabetes mellitus type 2 Controlled on current regimen  *History of Bell's palsy Stable  Continue acyclovir 200 mg 5 times daily as per PCP   Okay to discharge from a medical standpoint   All the records are reviewed and case discussed with Care Management/Social Workerr. Management plans discussed with the patient, family and they are in agreement.  CODE STATUS: Full code  TOTAL TIME TAKING CARE OF THIS PATIENT: 35 minutes.  More than 50% of time spent in counseling, coordination of care, reviewed medical records from PCP office from 12/15/2018 POSSIBLE D/C IN 1 to 2 days DAYS, DEPENDING ON CLINICAL CONDITION.   Dustin Flock M.D on 01/08/2019 at 1:12 PM  Between 7am to 6pm - Pager - [phone]  After 6pm go to [email or web address] - password [email or web address] [facility]  Office  [phone]  CC: Primary care physician; Rusty Aus, MD   Note: This dictation was prepared with Dragon dictation along with smaller phrase technology. Any transcriptional errors that result from this process are unintentional.

## 2019-01-10 DIAGNOSIS — K5652 Intestinal adhesions [bands] with complete obstruction: Secondary | ICD-10-CM

## 2019-01-13 DIAGNOSIS — N3090 Cystitis, unspecified without hematuria: Secondary | ICD-10-CM | POA: Diagnosis not present

## 2019-01-13 DIAGNOSIS — K56609 Unspecified intestinal obstruction, unspecified as to partial versus complete obstruction: Secondary | ICD-10-CM | POA: Diagnosis not present

## 2019-01-13 DIAGNOSIS — K660 Peritoneal adhesions (postprocedural) (postinfection): Secondary | ICD-10-CM | POA: Diagnosis not present

## 2019-03-08 DIAGNOSIS — R399 Unspecified symptoms and signs involving the genitourinary system: Secondary | ICD-10-CM | POA: Diagnosis not present

## 2019-03-31 DIAGNOSIS — E119 Type 2 diabetes mellitus without complications: Secondary | ICD-10-CM | POA: Diagnosis not present

## 2019-03-31 DIAGNOSIS — R05 Cough: Secondary | ICD-10-CM | POA: Diagnosis not present

## 2019-03-31 DIAGNOSIS — M5414 Radiculopathy, thoracic region: Secondary | ICD-10-CM | POA: Diagnosis not present

## 2019-04-18 DIAGNOSIS — I493 Ventricular premature depolarization: Secondary | ICD-10-CM | POA: Diagnosis not present

## 2019-04-18 DIAGNOSIS — M79672 Pain in left foot: Secondary | ICD-10-CM | POA: Diagnosis not present

## 2019-04-18 DIAGNOSIS — E119 Type 2 diabetes mellitus without complications: Secondary | ICD-10-CM | POA: Diagnosis not present

## 2019-04-26 DIAGNOSIS — B351 Tinea unguium: Secondary | ICD-10-CM | POA: Diagnosis not present

## 2019-04-26 DIAGNOSIS — M2012 Hallux valgus (acquired), left foot: Secondary | ICD-10-CM | POA: Diagnosis not present

## 2019-04-26 DIAGNOSIS — M2011 Hallux valgus (acquired), right foot: Secondary | ICD-10-CM | POA: Diagnosis not present

## 2019-04-26 DIAGNOSIS — M79675 Pain in left toe(s): Secondary | ICD-10-CM | POA: Diagnosis not present

## 2019-04-26 DIAGNOSIS — E119 Type 2 diabetes mellitus without complications: Secondary | ICD-10-CM | POA: Diagnosis not present

## 2019-08-04 IMAGING — CT CT ABD-PELV W/ CM
2 of 5 series · 16 of 46 positions shown, 18 images · IV contrast (APPLIED)
Comparison: 07/08/2017

CLINICAL DATA: Abdominal pain and vomiting

EXAM:
CT ABDOMEN AND PELVIS WITH CONTRAST
TECHNIQUE: Multidetector CT imaging of the abdomen and pelvis was performed
using the standard protocol following bolus administration of
intravenous contrast.
CONTRAST:  100mL OMNIPAQUE IOHEXOL 300 MG/ML  SOLN

[Series 5: coronal st · coronal · 0.69mm/px · 3 of 85 slices shown]
[im 29/85  soft-tissue]
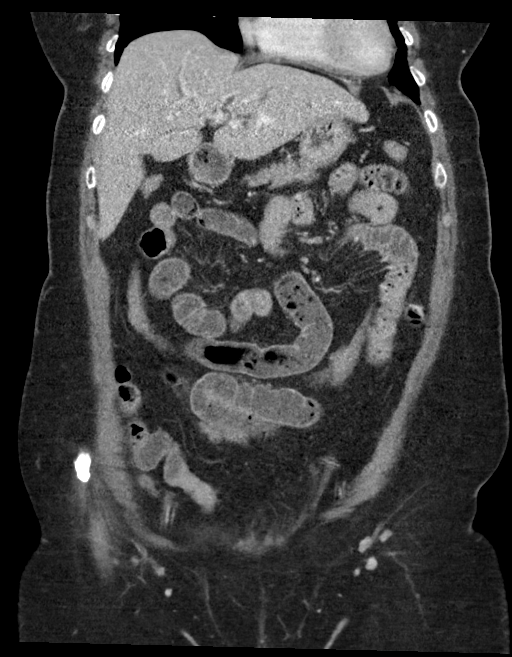
[im 38/85  soft-tissue]
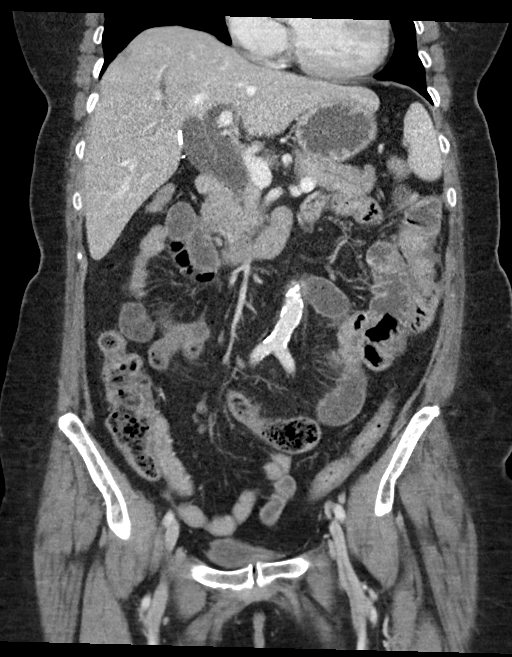
[im 47/85  soft-tissue]
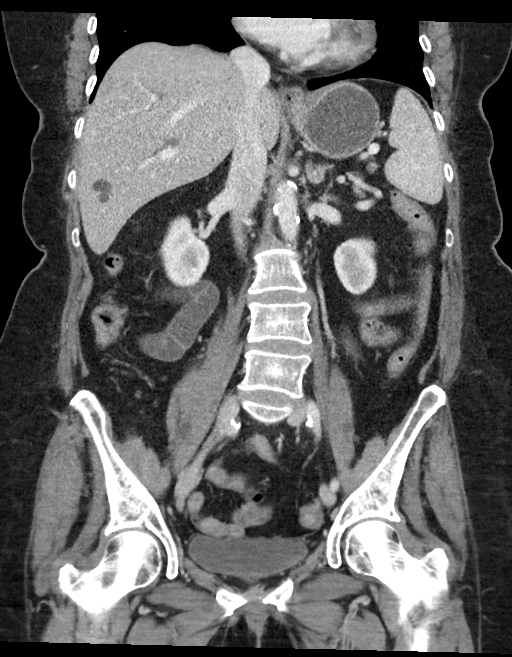

[Series 7: routine abd/pel with · axial · 0.75mm/px · z∈[-1188,-803]mm · 13 of 87 slices shown, 15 images]
[im 5/87  soft-tissue]
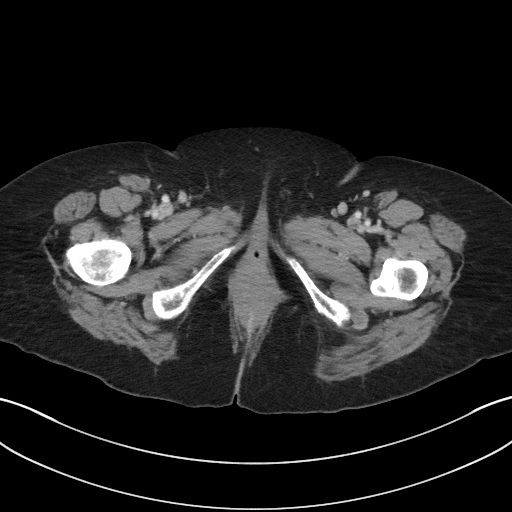
[im 5/87  bone]
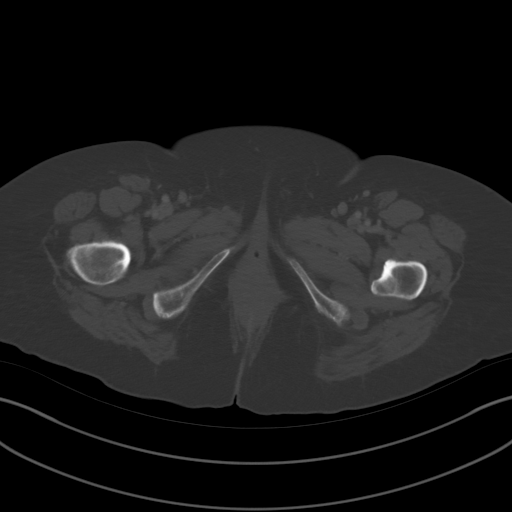
[im 10/87  soft-tissue]
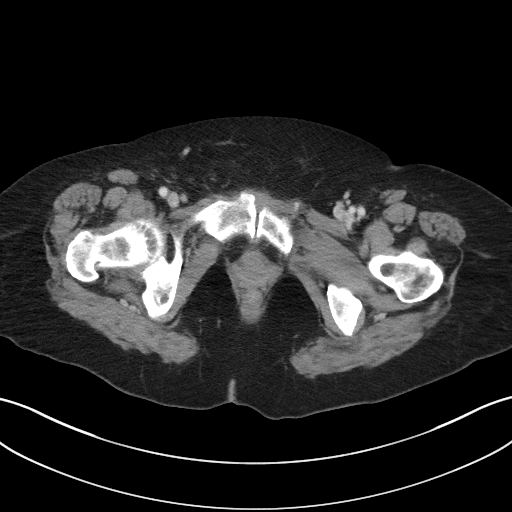
[im 20/87  soft-tissue]
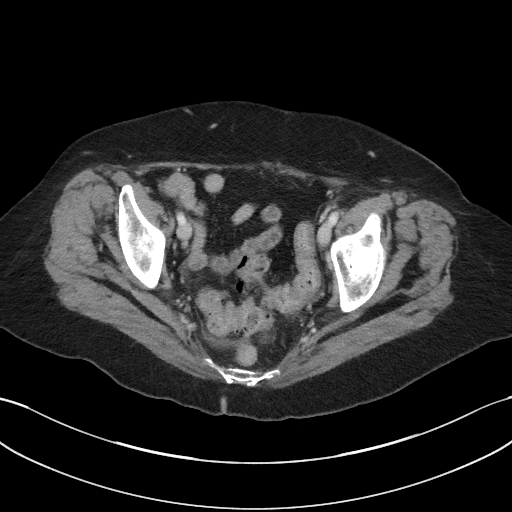
[im 24/87  soft-tissue]
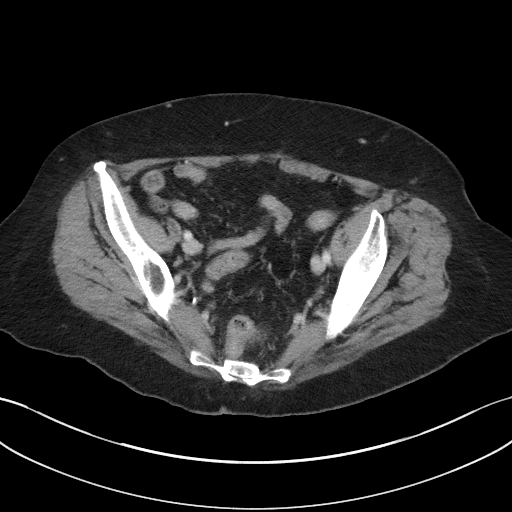
[im 29/87  soft-tissue]
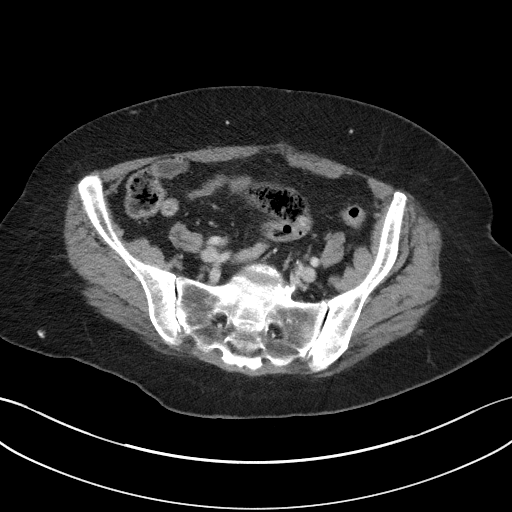
[im 39/87  soft-tissue]
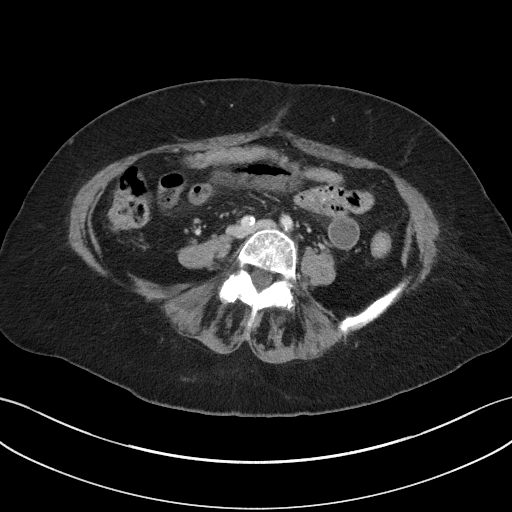
[im 44/87  soft-tissue]
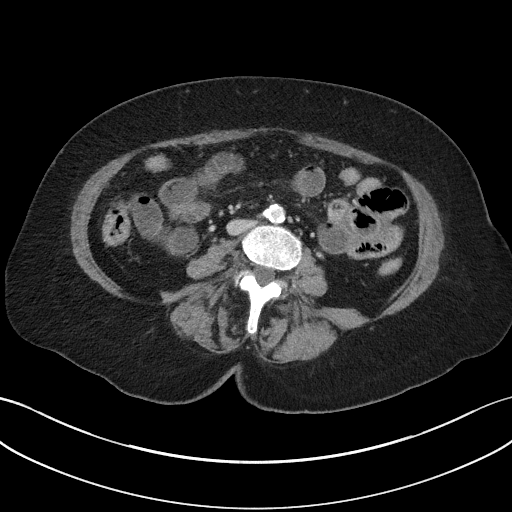
[im 48/87  soft-tissue]
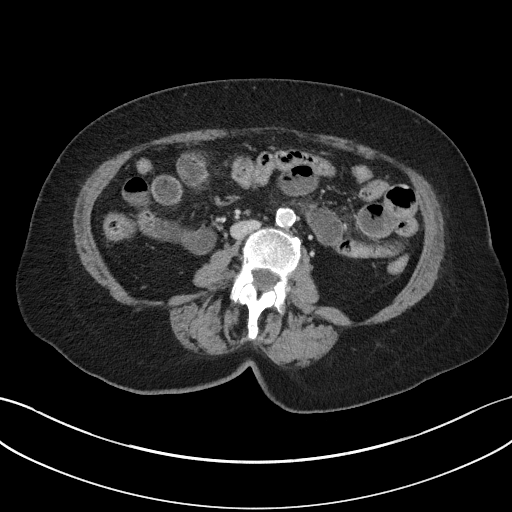
[im 58/87  soft-tissue]
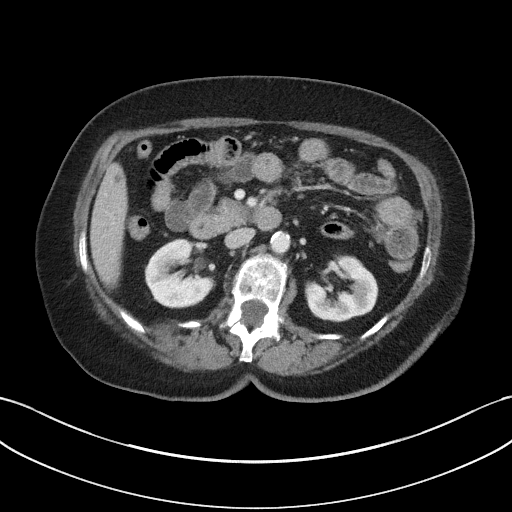
[im 58/87  bone]
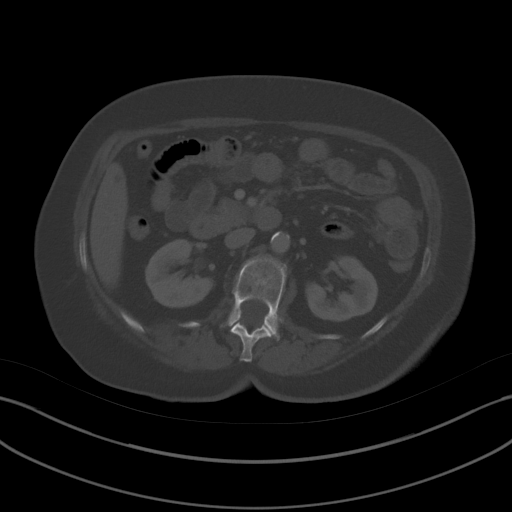
[im 63/87  soft-tissue]
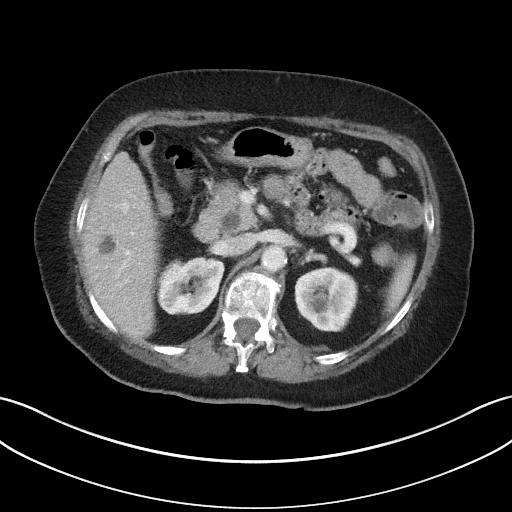
[im 67/87  soft-tissue]
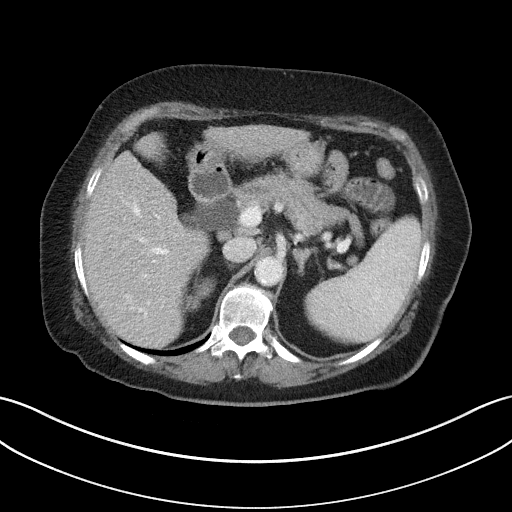
[im 77/87  soft-tissue]
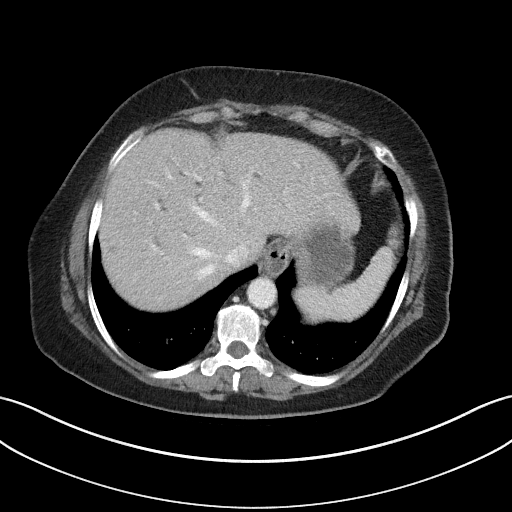
[im 82/87  soft-tissue]
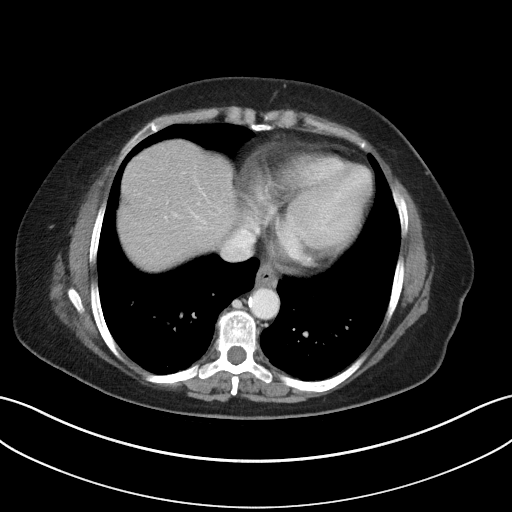

[16 of 46 positions shown; findings below may reference images not displayed]

FINDINGS: Lower chest:  Elevated right diaphragm, chronic.  No acute finding

Hepatobiliary: Small right lobe cystic densities. There is
cholecystectomy with chronic intra and extrahepatic bile duct
dilatation.

Pancreas: No acute or masslike finding.

Spleen: Stable.

Adrenals/Urinary Tract: Negative adrenals. No hydronephrosis or
stone. Small renal cystic densities. Unremarkable bladder.

Stomach/Bowel: Dilated small bowel with mild mesenteric edema above
a transition point in the low central abdomen. Segment immediately
above the transition point contains fecalized small bowel contents.
Bowel dilatation is mild, up to 3 cm. No evidence of bowel necrosis.
No underlying mass is seen. Appendectomy. Sigmoid diverticulosis.

Vascular/Lymphatic: Diffuse atherosclerotic calcification of the
aorta and iliacs. No mass or adenopathy.

Reproductive:Hysterectomy.

Other: No ascites or pneumoperitoneum.

Musculoskeletal: No acute abnormalities.
IMPRESSION: Small bowel obstruction with transition in the low pelvis,
presumably due to adhesions.

## 2019-08-06 IMAGING — US UPPER LEFT VENOUS EXTREMITY ULTRASOUND
1 series · 13 of 24 positions shown · non-contrast
Comparison: None.

CLINICAL DATA: 72-year-old female with a history of arm swelling



[Series 1: upper left venous extremity ultrasound · 0.08mm/px · 13 of 35 slices shown]
[im 1/35]
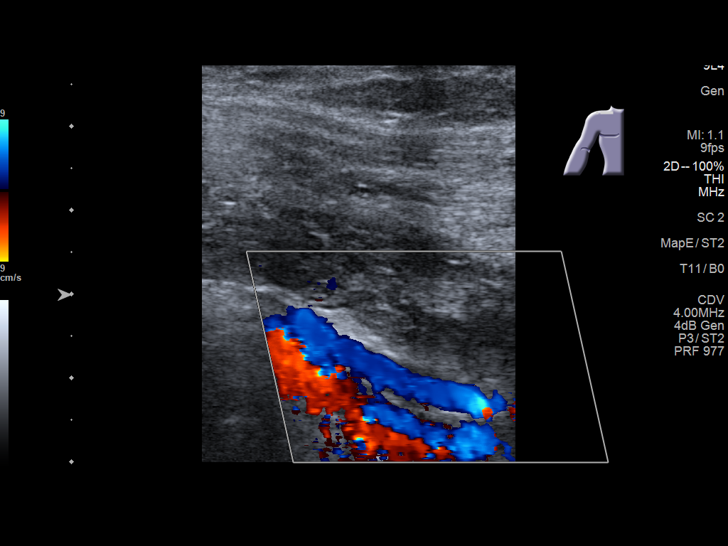
[im 3/35]
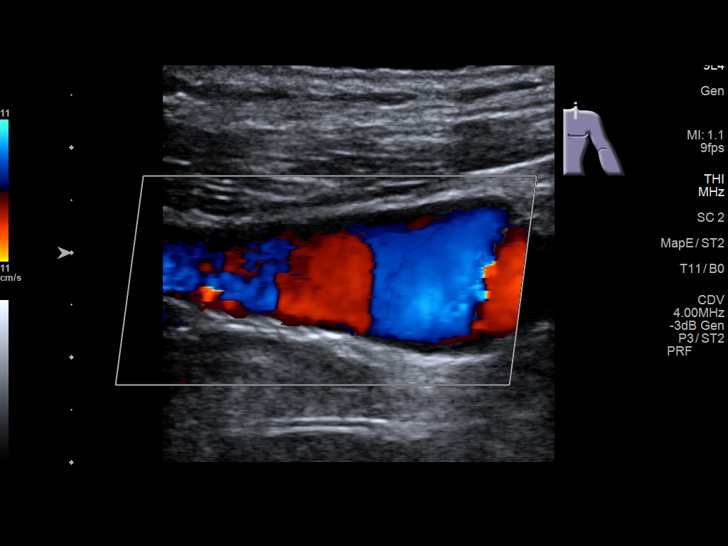
[im 6/35]
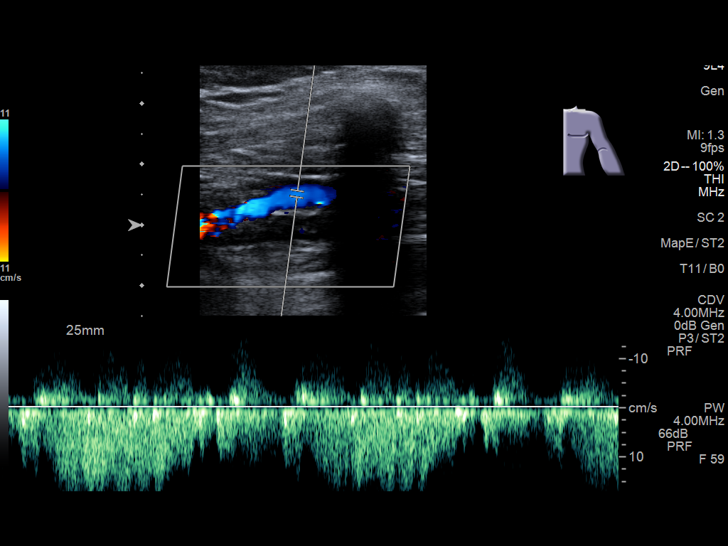
[im 9/35]
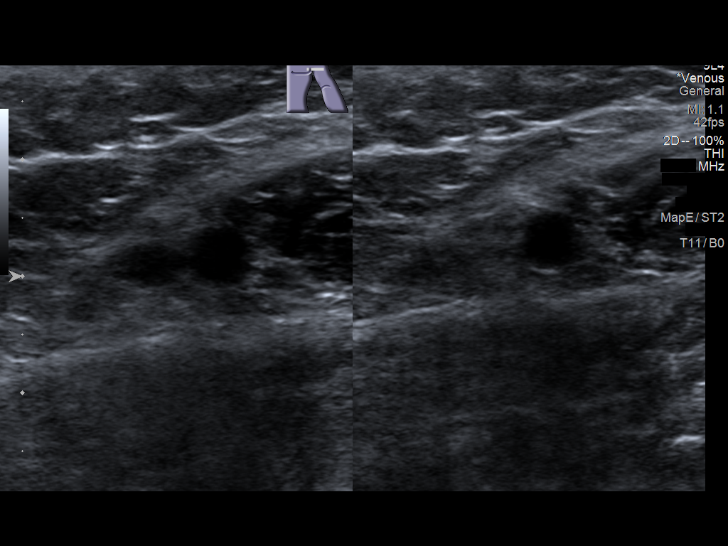
[im 12/35]
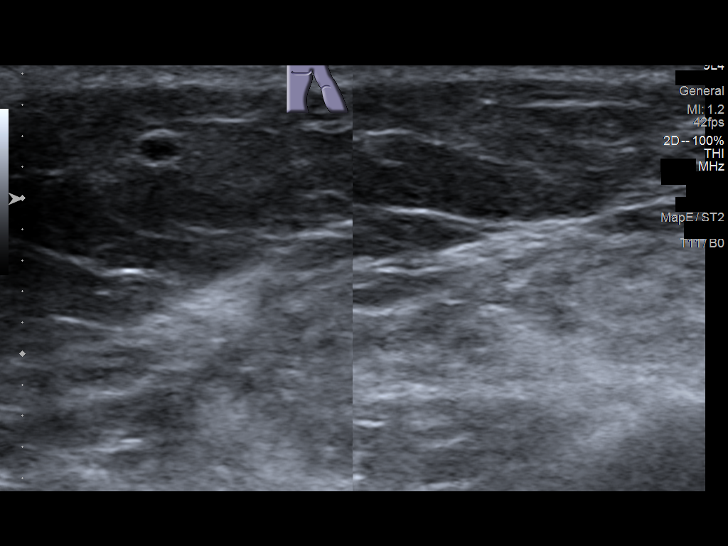
[im 15/35]
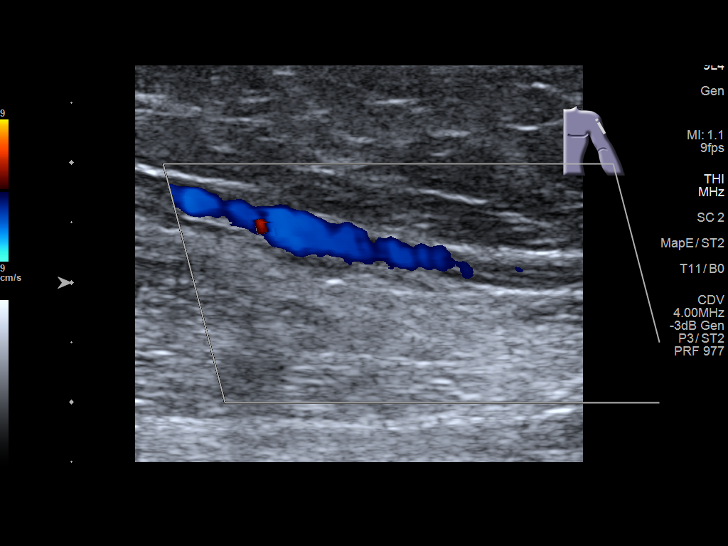
[im 18/35]
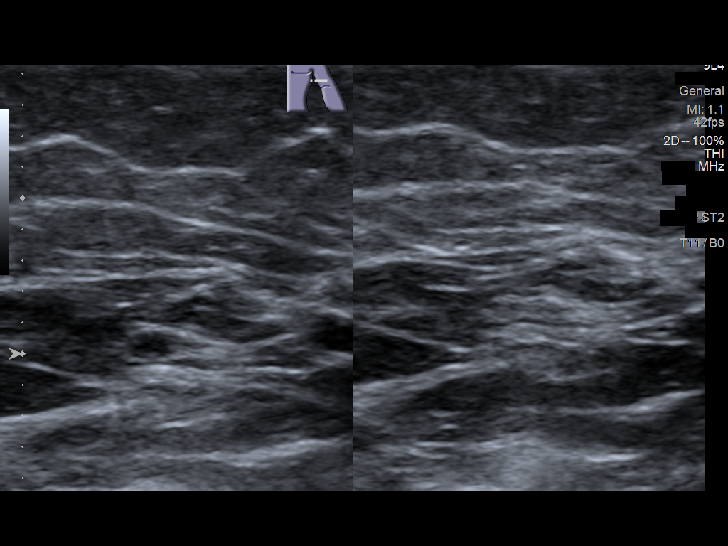
[im 20/35]
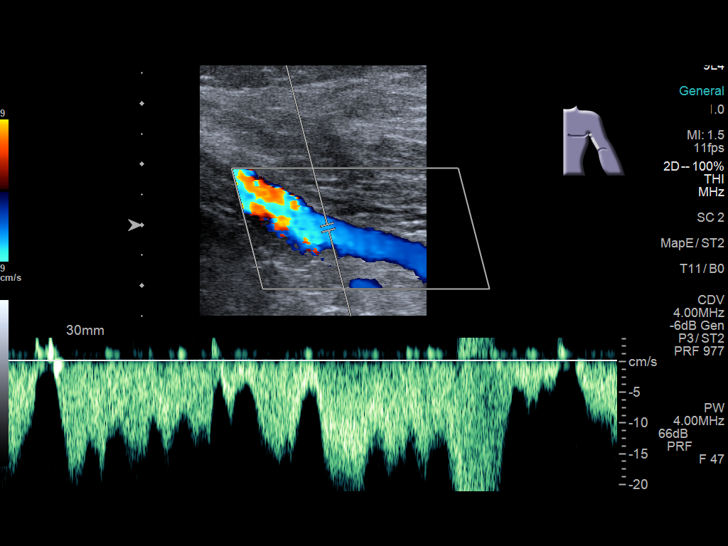
[im 23/35]
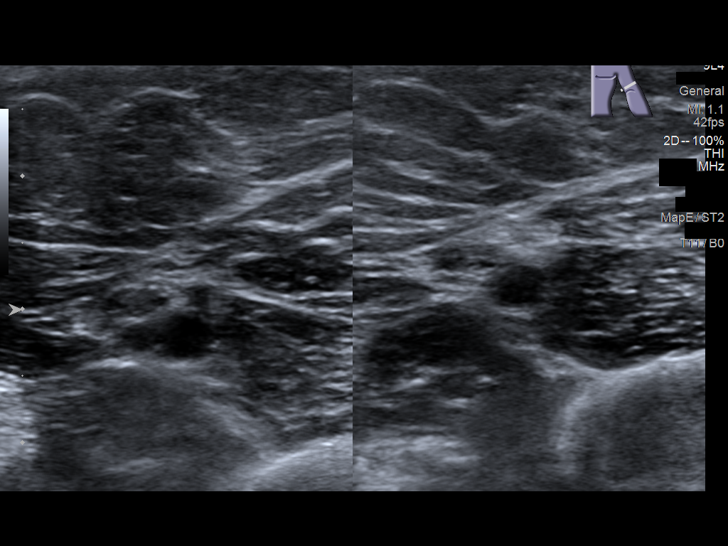
[im 26/35]
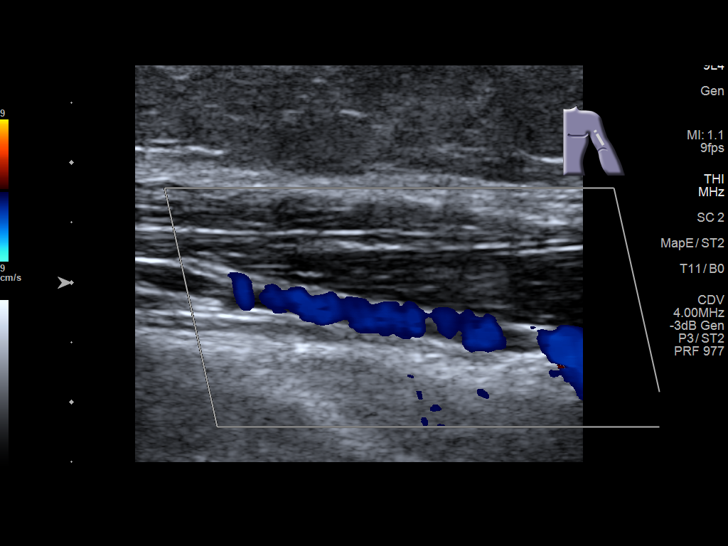
[im 29/35]
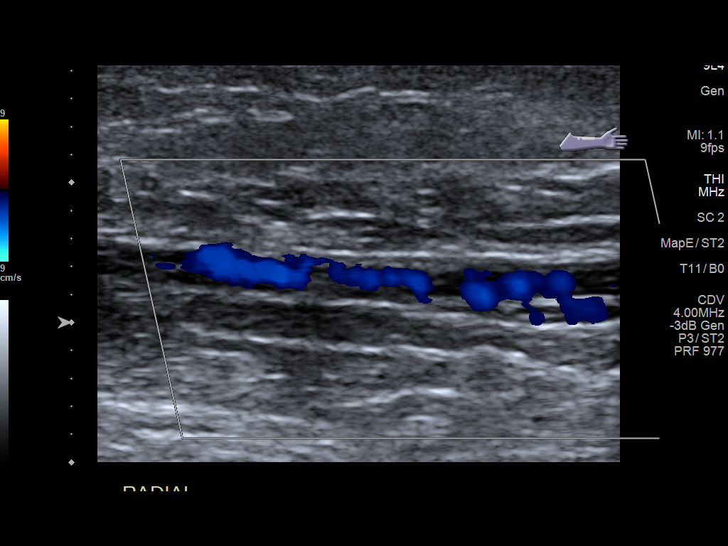
[im 32/35]
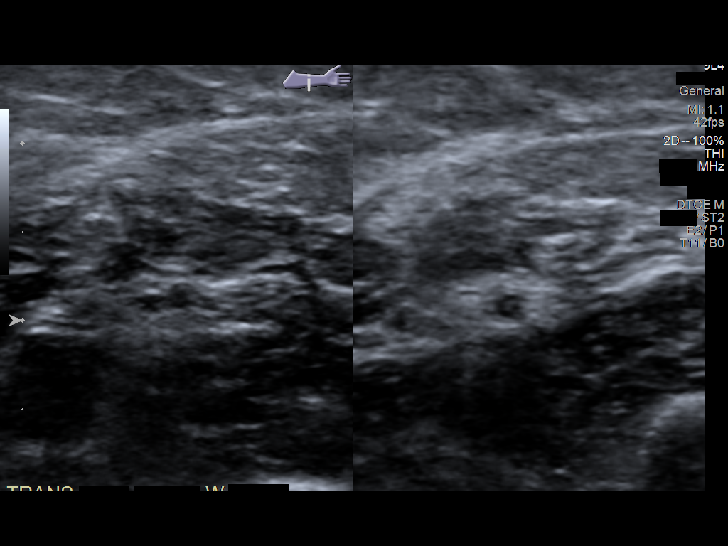
[im 35/35]
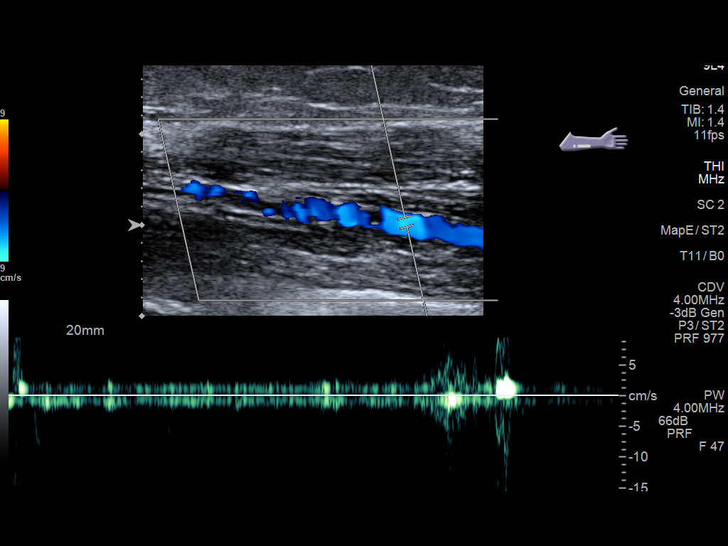

[13 of 24 positions shown; findings below may reference images not displayed]

FINDINGS: Contralateral Subclavian Vein: Respiratory phasicity is normal and
symmetric with the symptomatic side. No evidence of thrombus. Normal
compressibility.

Internal Jugular Vein: No evidence of thrombus. Normal
compressibility, respiratory phasicity and response to augmentation.

Subclavian Vein: No evidence of thrombus. Normal compressibility,
respiratory phasicity and response to augmentation.

Axillary Vein: No evidence of thrombus. Normal compressibility,
respiratory phasicity and response to augmentation.

Cephalic Vein: No evidence of thrombus. Normal compressibility,
respiratory phasicity and response to augmentation.

Basilic Vein: No evidence of thrombus. Normal compressibility,
respiratory phasicity and response to augmentation.

Brachial Veins: No evidence of thrombus. Normal compressibility,
respiratory phasicity and response to augmentation.

Radial Veins: No evidence of thrombus. Normal compressibility,
respiratory phasicity and response to augmentation.

Ulnar Veins: No evidence of thrombus. Normal compressibility,
respiratory phasicity and response to augmentation.

Other Findings:  None visualized.
IMPRESSION: Sonographic survey of the left upper extremity negative for DVT

## 2019-08-16 DIAGNOSIS — Z79899 Other long term (current) drug therapy: Secondary | ICD-10-CM | POA: Diagnosis not present

## 2019-08-16 DIAGNOSIS — M545 Low back pain: Secondary | ICD-10-CM | POA: Diagnosis not present

## 2019-08-16 DIAGNOSIS — Z87891 Personal history of nicotine dependence: Secondary | ICD-10-CM | POA: Diagnosis not present

## 2019-08-16 DIAGNOSIS — R5383 Other fatigue: Secondary | ICD-10-CM | POA: Diagnosis not present

## 2019-08-16 DIAGNOSIS — L659 Nonscarring hair loss, unspecified: Secondary | ICD-10-CM | POA: Diagnosis not present

## 2019-08-16 DIAGNOSIS — J029 Acute pharyngitis, unspecified: Secondary | ICD-10-CM | POA: Diagnosis not present

## 2019-08-29 DIAGNOSIS — L219 Seborrheic dermatitis, unspecified: Secondary | ICD-10-CM | POA: Diagnosis not present

## 2019-08-29 DIAGNOSIS — L650 Telogen effluvium: Secondary | ICD-10-CM | POA: Diagnosis not present

## 2019-08-30 DIAGNOSIS — E039 Hypothyroidism, unspecified: Secondary | ICD-10-CM | POA: Diagnosis not present

## 2019-08-30 DIAGNOSIS — Z0000 Encounter for general adult medical examination without abnormal findings: Secondary | ICD-10-CM | POA: Diagnosis not present

## 2019-08-30 DIAGNOSIS — M542 Cervicalgia: Secondary | ICD-10-CM | POA: Diagnosis not present

## 2019-08-30 DIAGNOSIS — Z87891 Personal history of nicotine dependence: Secondary | ICD-10-CM | POA: Diagnosis not present

## 2019-08-30 DIAGNOSIS — E119 Type 2 diabetes mellitus without complications: Secondary | ICD-10-CM | POA: Diagnosis not present

## 2019-08-30 DIAGNOSIS — G2581 Restless legs syndrome: Secondary | ICD-10-CM | POA: Diagnosis not present

## 2019-08-30 DIAGNOSIS — L659 Nonscarring hair loss, unspecified: Secondary | ICD-10-CM | POA: Diagnosis not present

## 2020-01-02 DIAGNOSIS — E119 Type 2 diabetes mellitus without complications: Secondary | ICD-10-CM | POA: Diagnosis not present

## 2020-01-04 DIAGNOSIS — E119 Type 2 diabetes mellitus without complications: Secondary | ICD-10-CM | POA: Diagnosis not present

## 2020-01-04 DIAGNOSIS — M5416 Radiculopathy, lumbar region: Secondary | ICD-10-CM | POA: Diagnosis not present

## 2020-01-04 DIAGNOSIS — Z79899 Other long term (current) drug therapy: Secondary | ICD-10-CM | POA: Diagnosis not present

## 2020-01-04 DIAGNOSIS — K5792 Diverticulitis of intestine, part unspecified, without perforation or abscess without bleeding: Secondary | ICD-10-CM | POA: Diagnosis not present

## 2020-01-04 DIAGNOSIS — K219 Gastro-esophageal reflux disease without esophagitis: Secondary | ICD-10-CM | POA: Diagnosis not present

## 2020-01-04 DIAGNOSIS — Z0000 Encounter for general adult medical examination without abnormal findings: Secondary | ICD-10-CM | POA: Diagnosis not present

## 2020-01-23 DIAGNOSIS — I208 Other forms of angina pectoris: Secondary | ICD-10-CM | POA: Diagnosis not present

## 2020-03-03 ENCOUNTER — Other Ambulatory Visit: Payer: Self-pay

## 2020-03-03 DIAGNOSIS — E039 Hypothyroidism, unspecified: Secondary | ICD-10-CM | POA: Insufficient documentation

## 2020-03-03 DIAGNOSIS — Z955 Presence of coronary angioplasty implant and graft: Secondary | ICD-10-CM | POA: Diagnosis not present

## 2020-03-03 DIAGNOSIS — R109 Unspecified abdominal pain: Secondary | ICD-10-CM | POA: Diagnosis not present

## 2020-03-03 DIAGNOSIS — F0390 Unspecified dementia without behavioral disturbance: Secondary | ICD-10-CM | POA: Diagnosis not present

## 2020-03-03 DIAGNOSIS — K5732 Diverticulitis of large intestine without perforation or abscess without bleeding: Secondary | ICD-10-CM | POA: Insufficient documentation

## 2020-03-03 DIAGNOSIS — I10 Essential (primary) hypertension: Secondary | ICD-10-CM | POA: Diagnosis not present

## 2020-03-03 DIAGNOSIS — B829 Intestinal parasitism, unspecified: Secondary | ICD-10-CM | POA: Diagnosis not present

## 2020-03-03 DIAGNOSIS — Z79899 Other long term (current) drug therapy: Secondary | ICD-10-CM | POA: Insufficient documentation

## 2020-03-03 DIAGNOSIS — I2510 Atherosclerotic heart disease of native coronary artery without angina pectoris: Secondary | ICD-10-CM | POA: Insufficient documentation

## 2020-03-03 DIAGNOSIS — Z85828 Personal history of other malignant neoplasm of skin: Secondary | ICD-10-CM | POA: Insufficient documentation

## 2020-03-03 DIAGNOSIS — Z8673 Personal history of transient ischemic attack (TIA), and cerebral infarction without residual deficits: Secondary | ICD-10-CM | POA: Diagnosis not present

## 2020-03-03 DIAGNOSIS — R1030 Lower abdominal pain, unspecified: Secondary | ICD-10-CM | POA: Diagnosis present

## 2020-03-03 DIAGNOSIS — E119 Type 2 diabetes mellitus without complications: Secondary | ICD-10-CM | POA: Insufficient documentation

## 2020-03-03 DIAGNOSIS — Z87891 Personal history of nicotine dependence: Secondary | ICD-10-CM | POA: Diagnosis not present

## 2020-03-03 DIAGNOSIS — Z7984 Long term (current) use of oral hypoglycemic drugs: Secondary | ICD-10-CM | POA: Insufficient documentation

## 2020-03-03 DIAGNOSIS — J45909 Unspecified asthma, uncomplicated: Secondary | ICD-10-CM | POA: Insufficient documentation

## 2020-03-03 LAB — URINALYSIS, COMPLETE (UACMP) WITH MICROSCOPIC
Bacteria, UA: NONE SEEN
Bilirubin Urine: NEGATIVE
Glucose, UA: 50 mg/dL — AB
Ketones, ur: NEGATIVE mg/dL
Leukocytes,Ua: NEGATIVE
Nitrite: NEGATIVE
Protein, ur: 30 mg/dL — AB
RBC / HPF: >50 RBC/hpf — ABNORMAL HIGH (ref 0–5)
Specific Gravity, Urine: 1.014 (ref 1.005–1.030)
Squamous Epithelial / HPF: NONE SEEN (ref 0–5)
pH: 6 (ref 5.0–8.0)

## 2020-03-03 LAB — COMPREHENSIVE METABOLIC PANEL
ALT: 14 U/L (ref 0–44)
AST: 17 U/L (ref 15–41)
Albumin: 4.2 g/dL (ref 3.5–5.0)
Alkaline Phosphatase: 87 U/L (ref 38–126)
Anion gap: 10 (ref 5–15)
BUN: 13 mg/dL (ref 8–23)
CO2: 24 mmol/L (ref 22–32)
Calcium: 9.1 mg/dL (ref 8.9–10.3)
Chloride: 103 mmol/L (ref 98–111)
Creatinine, Ser: 1.08 mg/dL — ABNORMAL HIGH (ref 0.44–1.00)
GFR calc Af Amer: 58 mL/min — ABNORMAL LOW (ref >60)
GFR calc non Af Amer: 50 mL/min — ABNORMAL LOW (ref >60)
Glucose, Bld: 318 mg/dL — ABNORMAL HIGH (ref 70–99)
Potassium: 3 mmol/L — ABNORMAL LOW (ref 3.5–5.1)
Sodium: 137 mmol/L (ref 135–145)
Total Bilirubin: 0.9 mg/dL (ref 0.3–1.2)
Total Protein: 7.5 g/dL (ref 6.5–8.1)

## 2020-03-03 LAB — LIPASE, BLOOD: Lipase: 26 U/L (ref 11–51)

## 2020-03-03 LAB — CBC
HCT: 41.8 % (ref 36.0–46.0)
Hemoglobin: 13.8 g/dL (ref 12.0–15.0)
MCH: 29.2 pg (ref 26.0–34.0)
MCHC: 33 g/dL (ref 30.0–36.0)
MCV: 88.4 fL (ref 80.0–100.0)
Platelets: 215 10*3/uL (ref 150–400)
RBC: 4.73 MIL/uL (ref 3.87–5.11)
RDW: 13.2 % (ref 11.5–15.5)
WBC: 13.9 10*3/uL — ABNORMAL HIGH (ref 4.0–10.5)
nRBC: 0 % (ref 0.0–0.2)

## 2020-03-03 NOTE — ED Triage Notes (Signed)
Patient c/o lower abdominal pain, bloody stool. Patient reports she passed a worm in her stool. Patient reports hx of SBO last year.

## 2020-03-04 ENCOUNTER — Emergency Department
Admission: EM | Admit: 2020-03-04 | Discharge: 2020-03-04 | Disposition: A | Discharge status: Home / Self Care | Payer: Medicare HMO | Attending: Emergency Medicine | Admitting: Emergency Medicine

## 2020-03-04 ENCOUNTER — Emergency Department: Payer: Medicare HMO

## 2020-03-04 ENCOUNTER — Encounter: Payer: Self-pay | Admitting: Radiology

## 2020-03-04 DIAGNOSIS — K5732 Diverticulitis of large intestine without perforation or abscess without bleeding: Secondary | ICD-10-CM

## 2020-03-04 DIAGNOSIS — B839 Helminthiasis, unspecified: Secondary | ICD-10-CM

## 2020-03-04 DIAGNOSIS — R109 Unspecified abdominal pain: Secondary | ICD-10-CM | POA: Diagnosis not present

## 2020-03-04 MED ORDER — IOHEXOL 300 MG/ML  SOLN
100.0000 mL | Freq: Once | INTRAMUSCULAR | Status: AC | PRN
  Administered 2020-03-04: 100 mL via INTRAVENOUS

## 2020-03-04 MED ORDER — AMOXICILLIN-POT CLAVULANATE 875-125 MG PO TABS: 1.0000 | ORAL_TABLET | Freq: Two times a day (BID) | ORAL | 0 refills | Status: AC

## 2020-03-04 MED ORDER — MORPHINE SULFATE (PF) 2 MG/ML IV SOLN
2.0000 mg | Freq: Once | INTRAVENOUS | Status: AC
  Administered 2020-03-04: 2 mg via INTRAVENOUS
  Filled 2020-03-04: qty 1

## 2020-03-04 MED ORDER — ONDANSETRON HCL 4 MG/2ML IJ SOLN
4.0000 mg | Freq: Once | INTRAMUSCULAR | Status: AC
  Administered 2020-03-04: 4 mg via INTRAVENOUS
  Filled 2020-03-04: qty 2

## 2020-03-04 MED ORDER — ALBENDAZOLE 200 MG PO TABS
400.0000 mg | ORAL_TABLET | Freq: Once | ORAL | Status: AC
  Administered 2020-03-04: 400 mg via ORAL
  Filled 2020-03-04: qty 2

## 2020-03-04 MED ORDER — AMOXICILLIN-POT CLAVULANATE 875-125 MG PO TABS
1.0000 | ORAL_TABLET | Freq: Once | ORAL | Status: AC
  Administered 2020-03-04: 1 via ORAL
  Filled 2020-03-04: qty 1

## 2020-03-04 NOTE — ED Notes (Signed)
Pt reports 2 days of lower abdomen pain, pt reports bloody mucus from rectum with "a white thin worm that was moving" seen in toilet after  Pt reports spitting up white foam

## 2020-03-04 NOTE — ED Provider Notes (Signed)
[facility] Emergency Department Provider Note  ____________________________________________   First MD Initiated Contact with Patient 03/04/20 0028     (approximate)  I have reviewed the triage vital signs and the nursing notes.   HISTORY  Chief Complaint No chief complaint on file.   HPI Cathy Hines is a 74 y.o. female with below listed previous medical conditions presents to the emergency department secondary to lower abdominal pain current pain score 5 out of 10 with associated bloody mucus stools times "few days".  In addition the patient states that she passed a "worm" in her stool that was witnessed by her granddaughter at bedside.  Patient does admit to recently working with compost.  Patient denies any fever.  Patient denies any urinary symptoms.  Patient denies any nausea or vomiting.        Past Medical History:  Diagnosis Date  . Arthritis    osteo  . Asthma    wheezing  . Bell's palsy    H/O  . Cancer ([place])    skin cancer on face and right arm- 2013  . Cellulitis    left shin  . Complication of anesthesia    woke up during one surgery  . Coronary artery disease    stent  . Cough    chronic  . CVA (cerebral vascular accident) ([place])   . Dementia ([place])    very mild  . Depression    distant H/O  . Diabetes mellitus without complication ([place])    type 2  . Diverticulitis    OSIS  . Gastritis   . GERD (gastroesophageal reflux disease)   . History of hiatal hernia   . Hypercholesterolemia   . Hypertension    on HTN meds  . Hypothyroidism   . Liver cyst   . Memory loss   . MVP (mitral valve prolapse)   . Pneumonia    in past  . Renal insufficiency    horseshoe kidney  . Sleep apnea    no CPAP  . Vertigo    dizziness  . Wears dentures    full upper and lower    Patient Active Problem List   Diagnosis Date Noted  . Intestinal adhesions with complete obstruction ([place])   . UTI (urinary tract infection) 01/04/2019   . NONSPECIFIC ABN FINDING RAD & OTH EXAM GI TRACT 12/24/2010    Past Surgical History:  Procedure Laterality Date  . ABDOMINAL HYSTERECTOMY    . APPENDECTOMY    . BROW LIFT Bilateral 06/22/2018   Procedure: BLEPHAROPLASTY UPPER EYELID WITH EXCESS SKIN DIABETIC;  Surgeon: Karle Starch, MD;  Location: [facility];  Service: Ophthalmology;  Laterality: Bilateral;  . CARPAL TUNNEL RELEASE    . CHOLECYSTECTOMY    . COLONOSCOPY    . HEMORRHOID SURGERY    . LIVER SURGERY     excision liver cyst  . PTOSIS REPAIR Bilateral 06/22/2018   Procedure: BROW PTOSIS REPAIR;  Surgeon: Karle Starch, MD;  Location: [facility];  Service: Ophthalmology;  Laterality: Bilateral;  Diabetes - oral meds    Prior to Admission medications   Medication Sig Start Date End Date Taking? Authorizing Provider  acyclovir (ZOVIRAX) 200 MG capsule Take 200 mg by mouth 5 (five) times daily.    [provider]  albuterol (PROVENTIL HFA;VENTOLIN HFA) 108 (90 Base) MCG/ACT inhaler Inhale into the lungs every 6 (six) hours as needed for wheezing or shortness of breath.    [provider]  amLODipine (NORVASC) 5 MG tablet Take 5 mg by mouth daily.    [provider]  BIOTIN PO Take by mouth daily.    [provider]  Cyanocobalamin (VITAMIN B12) 1000 MCG TBCR Take 1 tablet by mouth daily.     [provider]  docusate sodium (COLACE) 100 MG capsule Take 1 capsule (100 mg total) by mouth daily as needed for mild constipation. 01/08/19   Dustin Flock, MD  folic acid (FOLVITE) 1 MG tablet Take 1 tablet by mouth daily.     [provider]  glimepiride (AMARYL) 2 MG tablet Take 2 mg by mouth daily with breakfast.    [provider]  hydrochlorothiazide (HYDRODIURIL) 25 MG tablet Take 25 mg by mouth daily.    [provider]  levothyroxine (SYNTHROID, LEVOTHROID) 75 MCG tablet Take 75 mcg by mouth daily before breakfast.    [provider]  Magnesium 500 MG TABS Take 1,000 mg by mouth daily.    [provider]  metoprolol succinate (TOPROL-XL) 25 MG 24 hr tablet Take 25 mg by mouth daily.    [provider]  polyethylene glycol (MIRALAX / GLYCOLAX) packet Take 17 g by mouth daily. 01/08/19   Dustin Flock, MD  potassium chloride (K-DUR) 10 MEQ tablet Take 10 mEq by mouth daily.    [provider]  pramipexole (MIRAPEX) 0.125 MG tablet Take 0.125 mg by mouth 3 (three) times daily.    [provider]  traMADol (ULTRAM) 50 MG tablet Take by mouth every 6 (six) hours as needed.    [provider]    Allergies Ace inhibitors, Amoxicillin, Flagyl [metronidazole], Gabapentin, Kiwi extract, Milk-related compounds, Namenda [memantine hcl], Statins, Sulfa antibiotics, and Verapamil  Family History  Problem Relation Age of Onset  . Breast cancer Mother 38  . Breast cancer Sister 1    Social History Social History   Tobacco Use  . Smoking status: Former Smoker    Quit date: 1983    Years since quitting: 38.3  . Smokeless tobacco: Never Used  Substance Use Topics  . Alcohol use: No  . Drug use: Not on file    Review of Systems Constitutional: No fever/chills Eyes: No visual changes. ENT: No sore throat. Cardiovascular: Denies chest pain. Respiratory: Denies shortness of breath. Gastrointestinal: Positive for abdominal pain, warm noted in stool, bright red blood/mucus per rectum. Genitourinary: Negative for dysuria. Musculoskeletal: Negative for neck pain.  Negative for back pain. Integumentary: Negative for rash. Neurological: Negative for headaches, focal weakness or numbness.  ____________________________________________   PHYSICAL EXAM:  VITAL SIGNS: ED Triage Vitals [03/03/20 2108]  Enc Vitals Group     BP (!) 191/93     Pulse Rate 94     Resp 18     Temp 98.2 F (36.8 C)     Temp src      SpO2 100 %     Weight 72.6 kg (160 lb)     Height 1.499 m (4'  11")     Head Circumference      Peak Flow      Pain Score 10     Pain Loc      Pain Edu?      Excl. in [place]?     Constitutional: Alert and oriented.  Eyes: Conjunctivae are normal.  Mouth/Throat: Patient is wearing a mask. Neck: No stridor.  No meningeal signs.   Cardiovascular: Normal rate, regular rhythm. Good peripheral circulation. Grossly normal heart  sounds. Respiratory: Normal respiratory effort.  No retractions. Gastrointestinal: Left lower quadrant tenderness to palpation, no guarding, no rebound Musculoskeletal: No lower extremity tenderness nor edema. No gross deformities of extremities. Neurologic:  Normal speech and language. No gross focal neurologic deficits are appreciated.  Skin:  Skin is warm, dry and intact. Psychiatric: Mood and affect are normal. Speech and behavior are normal.  ____________________________________________   LABS (all labs ordered are listed, but only abnormal results are displayed)  Labs Reviewed  COMPREHENSIVE METABOLIC PANEL - Abnormal; Notable for the following components:      Result Value   Potassium 3.0 (*)    Glucose, Bld 318 (*)    Creatinine, Ser 1.08 (*)    GFR calc non Af Amer 50 (*)    GFR calc Af Amer 58 (*)    All other components within normal limits  CBC - Abnormal; Notable for the following components:   WBC 13.9 (*)    All other components within normal limits  URINALYSIS, COMPLETE (UACMP) WITH MICROSCOPIC - Abnormal; Notable for the following components:   Color, Urine YELLOW (*)    APPearance CLEAR (*)    Glucose, UA 50 (*)    Hgb urine dipstick LARGE (*)    Protein, ur 30 (*)    RBC / HPF >50 (*)    All other components within normal limits  LIPASE, BLOOD   _______________________________________ RADIOLOGY I, Easley N Thaxton Pelley, personally viewed and evaluated these images (plain radiographs) as part of my medical decision making, as well as reviewing the written report by the radiologist.  ED MD  interpretation: Acute uncomplicated diverticulitis of the proximal sigmoid colon noted on CT without evidence of perforation, fluid collection or abscess per radiologist  Official radiology report(s): CT ABDOMEN PELVIS W CONTRAST  Result Date: 03/04/2020 CLINICAL DATA:  Lower abdominal pain, bloody stool EXAM: CT ABDOMEN AND PELVIS WITH CONTRAST TECHNIQUE: Multidetector CT imaging of the abdomen and pelvis was performed using the standard protocol following bolus administration of intravenous contrast. CONTRAST:  185mL OMNIPAQUE IOHEXOL 300 MG/ML  SOLN COMPARISON:  01/04/2019 FINDINGS: Lower chest: No acute pleural or parenchymal lung disease. There is a small hiatal hernia. Hepatobiliary: Postsurgical changes from cholecystectomy, with expected dilatation of the common bile duct, stable. Scattered hepatic cysts are unchanged. No focal liver abnormality. Pancreas: Unremarkable. No pancreatic ductal dilatation or surrounding inflammatory changes. Spleen: Normal in size without focal abnormality. Adrenals/Urinary Tract: Adrenal glands are unremarkable. Kidneys are normal, without renal calculi, focal lesion, or hydronephrosis. Bladder is unremarkable. Stomach/Bowel: There is segmental wall thickening of the proximal sigmoid colon, with pericolonic fat stranding. Findings are consistent with acute uncomplicated diverticulitis. No perforation, fluid collection, or abscess. No bowel obstruction or ileus.  The appendix is surgically absent. Vascular/Lymphatic: Aortic atherosclerosis. No enlarged abdominal or pelvic lymph nodes. Reproductive: Status post hysterectomy. No adnexal masses. Other: There is trace free fluid within the pelvis. No free intraperitoneal gas. No abdominal wall hernia. Musculoskeletal: No acute or destructive bony lesion. Reconstructed images demonstrate no additional findings. IMPRESSION: 1. Acute uncomplicated diverticulitis of the proximal sigmoid colon. No perforation, fluid collection, or  abscess. 2.  Aortic Atherosclerosis (ICD10-I70.0). Electronically Signed   By: Randa Ngo M.D.   On: 03/04/2020 02:17    ____________________________________________    Procedures   ____________________________________________   INITIAL IMPRESSION / MDM / [place] / ED COURSE  As part of my medical decision making, I reviewed the following data within the [place]  74 year old female presented with above-stated history and physical exam a differential diagnosis including but not limited to parasitic infection and diverticulitis.  CT scan did confirm the evidence of uncomplicated diverticulitis for which patient was given Augmentin in the emergency department tolerated without difficulty.  Patient will be prescribed Augmentin for home.  In addition regarding reported worm noted in stool after the patient was working with compost.  Patient was given albendazole 150 mg p.o. in emergency department.  ____________________________________________  FINAL CLINICAL IMPRESSION(S) / ED DIAGNOSES  Final diagnoses:  Diverticulitis of large intestine without perforation or abscess without bleeding  Worms in stool     MEDICATIONS GIVEN DURING THIS VISIT:  Medications  morphine 2 MG/ML injection 2 mg (2 mg Intravenous Given 03/04/20 0141)  ondansetron (ZOFRAN) injection 4 mg (4 mg Intravenous Given 03/04/20 0139)  albendazole (ALBENZA) tablet 400 mg (400 mg Oral Given 03/04/20 0142)  iohexol (OMNIPAQUE) 300 MG/ML solution 100 mL (100 mLs Intravenous Contrast Given 03/04/20 0159)  amoxicillin-clavulanate (AUGMENTIN) 875-125 MG per tablet 1 tablet (1 tablet Oral Given 03/04/20 0351)     ED Discharge Orders    None      *Please note:  [name] was evaluated in Emergency Department on 03/04/2020 for the symptoms described in the history of present illness. She was evaluated in the context of the global COVID-19 pandemic, which necessitated consideration  that the patient might be at risk for infection with the SARS-CoV-2 virus that causes COVID-19. Institutional protocols and algorithms that pertain to the evaluation of patients at risk for COVID-19 are in a state of rapid change based on information released by regulatory bodies including the CDC and federal and state organizations. These policies and algorithms were followed during the patient's care in the ED.  Some ED evaluations and interventions may be delayed as a result of limited staffing during the pandemic.*  Note:  This document was prepared using Dragon voice recognition software and may include unintentional dictation errors.   Gregor Hams, MD 03/04/20 2237

## 2020-03-04 NOTE — ED Notes (Signed)
Peripheral IV discontinued. Catheter intact. No signs of infiltration or redness. Gauze applied to IV site.   Discharge instructions reviewed with patient. Questions fielded by this RN. Patient verbalizes understanding of instructions. Patient discharged home in stable condition per Dr Owens Shark. No acute distress noted at time of discharge.   Pt wheeled to family vehicle

## 2020-03-07 DIAGNOSIS — Z09 Encounter for follow-up examination after completed treatment for conditions other than malignant neoplasm: Secondary | ICD-10-CM | POA: Diagnosis not present

## 2020-03-07 DIAGNOSIS — Z87891 Personal history of nicotine dependence: Secondary | ICD-10-CM | POA: Diagnosis not present

## 2020-03-07 DIAGNOSIS — K5732 Diverticulitis of large intestine without perforation or abscess without bleeding: Secondary | ICD-10-CM | POA: Diagnosis not present

## 2020-03-07 DIAGNOSIS — G4700 Insomnia, unspecified: Secondary | ICD-10-CM | POA: Diagnosis not present

## 2020-03-07 DIAGNOSIS — R10819 Abdominal tenderness, unspecified site: Secondary | ICD-10-CM | POA: Diagnosis not present

## 2020-03-07 DIAGNOSIS — B839 Helminthiasis, unspecified: Secondary | ICD-10-CM | POA: Diagnosis not present

## 2020-04-27 DIAGNOSIS — E119 Type 2 diabetes mellitus without complications: Secondary | ICD-10-CM | POA: Diagnosis not present

## 2020-05-02 DIAGNOSIS — K219 Gastro-esophageal reflux disease without esophagitis: Secondary | ICD-10-CM | POA: Diagnosis not present

## 2020-05-02 DIAGNOSIS — Z789 Other specified health status: Secondary | ICD-10-CM | POA: Diagnosis not present

## 2020-05-02 DIAGNOSIS — E785 Hyperlipidemia, unspecified: Secondary | ICD-10-CM | POA: Diagnosis not present

## 2020-05-02 DIAGNOSIS — E119 Type 2 diabetes mellitus without complications: Secondary | ICD-10-CM | POA: Diagnosis not present

## 2020-05-02 DIAGNOSIS — L820 Inflamed seborrheic keratosis: Secondary | ICD-10-CM | POA: Diagnosis not present

## 2020-05-02 DIAGNOSIS — Z87891 Personal history of nicotine dependence: Secondary | ICD-10-CM | POA: Diagnosis not present

## 2020-06-05 DIAGNOSIS — S81802A Unspecified open wound, left lower leg, initial encounter: Secondary | ICD-10-CM | POA: Diagnosis not present

## 2020-06-05 DIAGNOSIS — F411 Generalized anxiety disorder: Secondary | ICD-10-CM | POA: Diagnosis not present

## 2020-06-14 DIAGNOSIS — G4733 Obstructive sleep apnea (adult) (pediatric): Secondary | ICD-10-CM | POA: Diagnosis not present

## 2020-06-17 DIAGNOSIS — G4733 Obstructive sleep apnea (adult) (pediatric): Secondary | ICD-10-CM | POA: Diagnosis not present

## 2020-07-07 DIAGNOSIS — J189 Pneumonia, unspecified organism: Secondary | ICD-10-CM | POA: Diagnosis not present

## 2020-07-07 DIAGNOSIS — Z20828 Contact with and (suspected) exposure to other viral communicable diseases: Secondary | ICD-10-CM | POA: Diagnosis not present

## 2020-07-07 DIAGNOSIS — J449 Chronic obstructive pulmonary disease, unspecified: Secondary | ICD-10-CM | POA: Diagnosis not present

## 2020-07-07 DIAGNOSIS — R110 Nausea: Secondary | ICD-10-CM | POA: Diagnosis not present

## 2020-07-19 DIAGNOSIS — J189 Pneumonia, unspecified organism: Secondary | ICD-10-CM | POA: Diagnosis not present

## 2020-07-19 DIAGNOSIS — E119 Type 2 diabetes mellitus without complications: Secondary | ICD-10-CM | POA: Diagnosis not present

## 2020-07-19 DIAGNOSIS — U071 COVID-19: Secondary | ICD-10-CM | POA: Diagnosis not present

## 2020-07-19 DIAGNOSIS — J1282 Pneumonia due to coronavirus disease 2019: Secondary | ICD-10-CM | POA: Diagnosis not present

## 2020-07-29 DIAGNOSIS — N10 Acute tubulo-interstitial nephritis: Secondary | ICD-10-CM | POA: Diagnosis not present

## 2020-07-29 DIAGNOSIS — R399 Unspecified symptoms and signs involving the genitourinary system: Secondary | ICD-10-CM | POA: Diagnosis not present

## 2020-08-02 DIAGNOSIS — U071 COVID-19: Secondary | ICD-10-CM | POA: Diagnosis not present

## 2020-08-02 DIAGNOSIS — E119 Type 2 diabetes mellitus without complications: Secondary | ICD-10-CM | POA: Diagnosis not present

## 2020-08-02 DIAGNOSIS — J1282 Pneumonia due to coronavirus disease 2019: Secondary | ICD-10-CM | POA: Diagnosis not present

## 2020-08-02 DIAGNOSIS — I700 Atherosclerosis of aorta: Secondary | ICD-10-CM | POA: Diagnosis not present

## 2020-08-27 DIAGNOSIS — M5414 Radiculopathy, thoracic region: Secondary | ICD-10-CM | POA: Diagnosis not present

## 2020-08-27 DIAGNOSIS — U071 COVID-19: Secondary | ICD-10-CM | POA: Diagnosis not present

## 2020-08-27 DIAGNOSIS — E119 Type 2 diabetes mellitus without complications: Secondary | ICD-10-CM | POA: Diagnosis not present

## 2020-08-27 DIAGNOSIS — J189 Pneumonia, unspecified organism: Secondary | ICD-10-CM | POA: Diagnosis not present

## 2020-08-27 DIAGNOSIS — J1282 Pneumonia due to coronavirus disease 2019: Secondary | ICD-10-CM | POA: Diagnosis not present

## 2020-09-05 DIAGNOSIS — E119 Type 2 diabetes mellitus without complications: Secondary | ICD-10-CM | POA: Diagnosis not present

## 2020-09-05 DIAGNOSIS — Z0000 Encounter for general adult medical examination without abnormal findings: Secondary | ICD-10-CM | POA: Diagnosis not present

## 2020-09-05 DIAGNOSIS — J1282 Pneumonia due to coronavirus disease 2019: Secondary | ICD-10-CM | POA: Diagnosis not present

## 2020-09-05 DIAGNOSIS — U071 COVID-19: Secondary | ICD-10-CM | POA: Diagnosis not present

## 2020-09-19 DIAGNOSIS — J45902 Unspecified asthma with status asthmaticus: Secondary | ICD-10-CM | POA: Diagnosis not present

## 2020-09-19 DIAGNOSIS — J45909 Unspecified asthma, uncomplicated: Secondary | ICD-10-CM | POA: Diagnosis not present

## 2020-09-19 DIAGNOSIS — J40 Bronchitis, not specified as acute or chronic: Secondary | ICD-10-CM | POA: Diagnosis not present

## 2020-09-19 DIAGNOSIS — R059 Cough, unspecified: Secondary | ICD-10-CM | POA: Diagnosis not present

## 2020-10-16 DIAGNOSIS — E119 Type 2 diabetes mellitus without complications: Secondary | ICD-10-CM | POA: Diagnosis not present

## 2020-11-22 DIAGNOSIS — H43813 Vitreous degeneration, bilateral: Secondary | ICD-10-CM | POA: Diagnosis not present

## 2021-01-24 DIAGNOSIS — H52209 Unspecified astigmatism, unspecified eye: Secondary | ICD-10-CM | POA: Diagnosis not present

## 2021-01-24 DIAGNOSIS — H5213 Myopia, bilateral: Secondary | ICD-10-CM | POA: Diagnosis not present

## 2021-01-24 DIAGNOSIS — H524 Presbyopia: Secondary | ICD-10-CM | POA: Diagnosis not present

## 2021-03-06 DIAGNOSIS — E119 Type 2 diabetes mellitus without complications: Secondary | ICD-10-CM | POA: Diagnosis not present

## 2021-03-13 DIAGNOSIS — I700 Atherosclerosis of aorta: Secondary | ICD-10-CM | POA: Diagnosis not present

## 2021-03-13 DIAGNOSIS — E119 Type 2 diabetes mellitus without complications: Secondary | ICD-10-CM | POA: Diagnosis not present

## 2021-03-13 DIAGNOSIS — Z0000 Encounter for general adult medical examination without abnormal findings: Secondary | ICD-10-CM | POA: Diagnosis not present

## 2021-03-26 DIAGNOSIS — K130 Diseases of lips: Secondary | ICD-10-CM | POA: Diagnosis not present

## 2021-04-22 DIAGNOSIS — W1800XA Striking against unspecified object with subsequent fall, initial encounter: Secondary | ICD-10-CM | POA: Diagnosis not present

## 2021-04-22 DIAGNOSIS — M25571 Pain in right ankle and joints of right foot: Secondary | ICD-10-CM | POA: Diagnosis not present

## 2021-04-22 DIAGNOSIS — E119 Type 2 diabetes mellitus without complications: Secondary | ICD-10-CM | POA: Diagnosis not present

## 2021-04-22 DIAGNOSIS — L0390 Cellulitis, unspecified: Secondary | ICD-10-CM | POA: Diagnosis not present

## 2021-04-25 DIAGNOSIS — M5441 Lumbago with sciatica, right side: Secondary | ICD-10-CM | POA: Diagnosis not present

## 2021-04-25 DIAGNOSIS — L0390 Cellulitis, unspecified: Secondary | ICD-10-CM | POA: Diagnosis not present

## 2021-04-25 DIAGNOSIS — E119 Type 2 diabetes mellitus without complications: Secondary | ICD-10-CM | POA: Diagnosis not present

## 2021-04-30 DIAGNOSIS — E119 Type 2 diabetes mellitus without complications: Secondary | ICD-10-CM | POA: Diagnosis not present

## 2021-05-09 DIAGNOSIS — E119 Type 2 diabetes mellitus without complications: Secondary | ICD-10-CM | POA: Diagnosis not present

## 2021-05-09 DIAGNOSIS — R600 Localized edema: Secondary | ICD-10-CM | POA: Diagnosis not present

## 2021-05-09 DIAGNOSIS — S8011XA Contusion of right lower leg, initial encounter: Secondary | ICD-10-CM | POA: Diagnosis not present

## 2021-05-15 DIAGNOSIS — E119 Type 2 diabetes mellitus without complications: Secondary | ICD-10-CM | POA: Diagnosis not present

## 2021-05-15 DIAGNOSIS — L0390 Cellulitis, unspecified: Secondary | ICD-10-CM | POA: Diagnosis not present

## 2021-05-16 DIAGNOSIS — I739 Peripheral vascular disease, unspecified: Secondary | ICD-10-CM | POA: Diagnosis not present

## 2021-05-16 DIAGNOSIS — L97919 Non-pressure chronic ulcer of unspecified part of right lower leg with unspecified severity: Secondary | ICD-10-CM | POA: Diagnosis not present

## 2021-05-16 DIAGNOSIS — E1143 Type 2 diabetes mellitus with diabetic autonomic (poly)neuropathy: Secondary | ICD-10-CM | POA: Diagnosis not present

## 2021-05-16 DIAGNOSIS — I83019 Varicose veins of right lower extremity with ulcer of unspecified site: Secondary | ICD-10-CM | POA: Diagnosis not present

## 2021-05-16 DIAGNOSIS — I872 Venous insufficiency (chronic) (peripheral): Secondary | ICD-10-CM | POA: Diagnosis not present

## 2021-06-17 ENCOUNTER — Other Ambulatory Visit (INDEPENDENT_AMBULATORY_CARE_PROVIDER_SITE_OTHER): Payer: Self-pay | Admitting: Nurse Practitioner

## 2021-06-17 DIAGNOSIS — L97909 Non-pressure chronic ulcer of unspecified part of unspecified lower leg with unspecified severity: Secondary | ICD-10-CM

## 2021-06-17 DIAGNOSIS — I739 Peripheral vascular disease, unspecified: Secondary | ICD-10-CM

## 2021-06-19 ENCOUNTER — Ambulatory Visit (INDEPENDENT_AMBULATORY_CARE_PROVIDER_SITE_OTHER): Payer: Medicare HMO | Admitting: Nurse Practitioner

## 2021-06-19 ENCOUNTER — Ambulatory Visit (INDEPENDENT_AMBULATORY_CARE_PROVIDER_SITE_OTHER): Payer: Medicare HMO

## 2021-06-19 ENCOUNTER — Other Ambulatory Visit: Payer: Self-pay

## 2021-06-19 VITALS — BP 148/72 | HR 66 | Ht <= 58 in | Wt 156.0 lb

## 2021-06-19 DIAGNOSIS — I83009 Varicose veins of unspecified lower extremity with ulcer of unspecified site: Secondary | ICD-10-CM | POA: Diagnosis not present

## 2021-06-19 DIAGNOSIS — L97909 Non-pressure chronic ulcer of unspecified part of unspecified lower leg with unspecified severity: Secondary | ICD-10-CM

## 2021-06-19 DIAGNOSIS — I739 Peripheral vascular disease, unspecified: Secondary | ICD-10-CM | POA: Diagnosis not present

## 2021-06-29 ENCOUNTER — Encounter (INDEPENDENT_AMBULATORY_CARE_PROVIDER_SITE_OTHER): Payer: Self-pay | Admitting: Nurse Practitioner

## 2021-06-29 NOTE — Progress Notes (Signed)
Subjective:    Patient ID: Cathy Hines, female    DOB: 07-01-1946, 75 y.o.   MRN: [id number] Chief Complaint  Patient presents with  . New Patient (Initial Visit)    Np PAD RLE venous venous ulcer CVI abi and consult referred by Marjean Donna is a 75 year old female that is referred today for due to a slow healing venous ulceration.  Initially the patient was having right lower extremity edema and had a ulceration for about 5 to 6 months.  The patient has been also having episodes of cellulitis as well.  She has had multiple rounds of antibiotic therapy she was initially referred to general surgery for possible debridement of her ulceration.  However prior to debridement they wanted to ensure the patient had no issues with perfusion.  Today however the area is scabbed over and normal hemoglobin as it has been previously.  She denies any new wounds or ulcerations.  Today noninvasive studies show an ABI of 1.12 on the right and 1.09 on the left.  Patient has a TBI of 0.93 on the right and 0.91 on the left.  She has triphasic tibial artery waveforms bilaterally with good toe waveforms bilaterally.  Review of Systems  Cardiovascular:  Positive for leg swelling.  Skin:  Negative for wound.  All other systems reviewed and are negative.     Objective:   Physical Exam Vitals reviewed.  HENT:     Head: Normocephalic.  Cardiovascular:     Rate and Rhythm: Normal rate.     Pulses:          Dorsalis pedis pulses are 1+ on the right side and 1+ on the left side.  Pulmonary:     Effort: Pulmonary effort is normal.  Neurological:     Mental Status: She is alert and oriented to person, place, and time.  Psychiatric:        Mood and Affect: Mood normal.        Behavior: Behavior normal.        Thought Content: Thought content normal.        Judgment: Judgment normal.    BP (!) 148/72   Pulse 66   Ht '4\' 10"'$  (1.473 m)   Wt 156 lb (70.8 kg)   BMI 32.60 kg/m    Past Medical History:  Diagnosis Date  . Arthritis    osteo  . Asthma    wheezing  . Bell's palsy    H/O  . Cancer ([place])    skin cancer on face and right arm- 2013  . Cellulitis    left shin  . Complication of anesthesia    woke up during one surgery  . Coronary artery disease    stent  . Cough    chronic  . CVA (cerebral vascular accident) ([place])   . Dementia ([place])    very mild  . Depression    distant H/O  . Diabetes mellitus without complication ([place])    type 2  . Diverticulitis    OSIS  . Gastritis   . GERD (gastroesophageal reflux disease)   . History of hiatal hernia   . Hypercholesterolemia   . Hypertension    on HTN meds  . Hypothyroidism   . Liver cyst   . Memory loss   . MVP (mitral valve prolapse)   . Pneumonia    in past  . Renal insufficiency    horseshoe kidney  . Sleep apnea  no CPAP  . Vertigo    dizziness  . Wears dentures    full upper and lower    Social History   Socioeconomic History  . Marital status: Married    Spouse name: Not on file  . Number of children: Not on file  . Years of education: Not on file  . Highest education level: Not on file  Occupational History  . Not on file  Tobacco Use  . Smoking status: Former    Types: Cigarettes    Quit date: 1983    Years since quitting: 39.6  . Smokeless tobacco: Never  Vaping Use  . Vaping Use: Never used  Substance and Sexual Activity  . Alcohol use: No  . Drug use: Not on file  . Sexual activity: Not on file  Other Topics Concern  . Not on file  Social History Narrative  . Not on file   Social Determinants of Health   Financial Resource Strain: Not on file  Food Insecurity: Not on file  Transportation Needs: Not on file  Physical Activity: Not on file  Stress: Not on file  Social Connections: Not on file  Intimate Partner Violence: Not on file    Past Surgical History:  Procedure Laterality Date  . ABDOMINAL HYSTERECTOMY    . APPENDECTOMY    . BROW  LIFT Bilateral 06/22/2018   Procedure: BLEPHAROPLASTY UPPER EYELID WITH EXCESS SKIN DIABETIC;  Surgeon: Karle Starch, MD;  Location: [facility];  Service: Ophthalmology;  Laterality: Bilateral;  . CARPAL TUNNEL RELEASE    . CHOLECYSTECTOMY    . COLONOSCOPY    . HEMORRHOID SURGERY    . LIVER SURGERY     excision liver cyst  . PTOSIS REPAIR Bilateral 06/22/2018   Procedure: BROW PTOSIS REPAIR;  Surgeon: Karle Starch, MD;  Location: [facility];  Service: Ophthalmology;  Laterality: Bilateral;  Diabetes - oral meds    Family History  Problem Relation Age of Onset  . Breast cancer Mother 84  . Breast cancer Sister 37    Allergies  Allergen Reactions  . Ace Inhibitors Cough  . Amoxicillin     Pt doesn't remember  . Aspirin Other (See Comments)    Per patient, GI bleed.  . Flagyl [Metronidazole] Other (See Comments)    Pt cannot remember  . Gabapentin     headache  . Kiwi Extract Other (See Comments)    Lips swell, mouth goes numb  . Memantine Other (See Comments)  . Milk-Related Compounds Nausea And Vomiting  . Namenda [Memantine Hcl]     Pt doesn't remember  . Statins     Pt doesn't remember  . Sulfa Antibiotics Swelling  . Verapamil     Pt doesn't remember    CBC Latest Ref Rng & Units 03/03/2020 01/07/2019 01/05/2019  WBC 4.0 - 10.5 K/uL 13.9(H) 7.8 -  Hemoglobin 12.0 - 15.0 g/dL 13.8 13.5 12.8  Hematocrit 36.0 - 46.0 % 41.8 40.9 -  Platelets 150 - 400 K/uL 215 207 -      CMP     Component Value Date/Time   NA 137 03/03/2020 2111   K 3.0 (L) 03/03/2020 2111   CL 103 03/03/2020 2111   CO2 24 03/03/2020 2111   GLUCOSE 318 (H) 03/03/2020 2111   BUN 13 03/03/2020 2111   CREATININE 1.08 (H) 03/03/2020 2111   CALCIUM 9.1 03/03/2020 2111   PROT 7.5 03/03/2020 2111   ALBUMIN 4.2 03/03/2020 2111   AST  17 03/03/2020 2111   ALT 14 03/03/2020 2111   ALKPHOS 87 03/03/2020 2111   BILITOT 0.9 03/03/2020 2111   GFRNONAA 50 (L) 03/03/2020 2111   GFRAA 58  (L) 03/03/2020 2111     VAS [place] ABI WITH/WO TBI  Result Date: 06/25/2021  LOWER EXTREMITY DOPPLER STUDY Patient Name:  Cathy Hines  Date of Exam:   06/19/2021 Medical Rec #: [id number]        Accession #:    [id number] Date of Birth: March 07, 1946        Patient Gender: F Patient Age:   51Y Exam Location:  [facility] Procedure:      VAS [place] ABI WITH/WO TBI Referring Phys: [id number] [place] --------------------------------------------------------------------------------  Indications: Slow healing right shin wound.  Performing Technologist: Concha Norway RVT  Examination Guidelines: A complete evaluation includes at minimum, Doppler waveform signals and systolic blood pressure reading at the level of bilateral brachial, anterior tibial, and posterior tibial arteries, when vessel segments are accessible. Bilateral testing is considered an integral part of a complete examination. Photoelectric Plethysmograph (PPG) waveforms and toe systolic pressure readings are included as required and additional duplex testing as needed. Limited examinations for reoccurring indications may be performed as noted.  ABI Findings: +---------+------------------+-----+---------+--------+ Right    Rt Pressure (mmHg)IndexWaveform Comment  +---------+------------------+-----+---------+--------+ Brachial 170                                      +---------+------------------+-----+---------+--------+ ATA      186               1.09 triphasic         +---------+------------------+-----+---------+--------+ PTA      190               1.12 triphasic         +---------+------------------+-----+---------+--------+ Great Toe158               0.93 Normal            +---------+------------------+-----+---------+--------+ +---------+------------------+-----+---------+-------+ Left     Lt Pressure (mmHg)IndexWaveform Comment +---------+------------------+-----+---------+-------+ Brachial 167                                      +---------+------------------+-----+---------+-------+ ATA      174               1.02 triphasic        +---------+------------------+-----+---------+-------+ PTA      186               1.09 triphasic        +---------+------------------+-----+---------+-------+ Great Toe154               0.91 Normal           +---------+------------------+-----+---------+-------+  Summary: Right: Resting right ankle-brachial index is within normal range. No evidence of significant right lower extremity arterial disease. The right toe-brachial index is normal. Left: Resting left ankle-brachial index is within normal range. No evidence of significant left lower extremity arterial disease. The left toe-brachial index is normal.  *See table(s) above for measurements and observations.  Electronically signed by Leotis Pain MD on 06/25/2021 at 10:19:09 AM.    Final        Assessment & Plan:   1. Venous ulcer ([place]) The wound has healed at  this time.  Patient is advised to utilize medical grade compression stockings in addition to elevation and activity to help prevent venous ulceration.  2. PVD (peripheral vascular disease) ([place]) Noninvasive studies show no evidence of peripheral arterial disease.  The patient has normal studies today.  The patient will follow-up with [place] on an as-needed basis.   Current Outpatient Medications on File Prior to Visit  Medication Sig Dispense Refill  . acyclovir (ZOVIRAX) 200 MG capsule Take 200 mg by mouth 5 (five) times daily.    Marland Kitchen albuterol (PROVENTIL HFA;VENTOLIN HFA) 108 (90 Base) MCG/ACT inhaler Inhale into the lungs every 6 (six) hours as needed for wheezing or shortness of breath.    . Cholecalciferol 25 MCG (1000 UT) tablet Take by mouth.    . clotrimazole-betamethasone (LOTRISONE) cream     . Cyanocobalamin (VITAMIN B12) 1000 MCG TBCR Take 1 tablet by mouth daily.     Marland Kitchen docusate sodium (COLACE) 100 MG capsule Take 1 capsule (100 mg  total) by mouth daily as needed for mild constipation. 10 capsule 0  . doxycycline (VIBRA-TABS) 100 MG tablet     . folic acid (FOLVITE) 1 MG tablet Take 1 tablet by mouth daily.     Marland Kitchen levothyroxine (SYNTHROID, LEVOTHROID) 75 MCG tablet Take 75 mcg by mouth daily before breakfast.    . Magnesium 500 MG TABS Take 1,000 mg by mouth daily.    Marland Kitchen omeprazole (PRILOSEC) 40 MG capsule     . polyethylene glycol (MIRALAX / GLYCOLAX) packet Take 17 g by mouth daily. 30 each 0  . pramipexole (MIRAPEX) 0.125 MG tablet Take 0.125 mg by mouth 3 (three) times daily.     No current facility-administered medications on file prior to visit.    There are no Patient Instructions on file for this visit. No follow-ups on file.   Kris Hartmann, NP

## 2021-07-16 DIAGNOSIS — M79675 Pain in left toe(s): Secondary | ICD-10-CM | POA: Diagnosis not present

## 2021-07-16 DIAGNOSIS — G2581 Restless legs syndrome: Secondary | ICD-10-CM | POA: Diagnosis not present

## 2021-09-11 DIAGNOSIS — E119 Type 2 diabetes mellitus without complications: Secondary | ICD-10-CM | POA: Diagnosis not present

## 2021-09-18 DIAGNOSIS — E119 Type 2 diabetes mellitus without complications: Secondary | ICD-10-CM | POA: Diagnosis not present

## 2021-09-18 DIAGNOSIS — Z0000 Encounter for general adult medical examination without abnormal findings: Secondary | ICD-10-CM | POA: Diagnosis not present

## 2021-09-18 DIAGNOSIS — D692 Other nonthrombocytopenic purpura: Secondary | ICD-10-CM | POA: Diagnosis not present

## 2021-12-13 ENCOUNTER — Other Ambulatory Visit: Payer: Self-pay | Admitting: Internal Medicine

## 2021-12-13 DIAGNOSIS — Z1231 Encounter for screening mammogram for malignant neoplasm of breast: Secondary | ICD-10-CM

## 2021-12-16 DIAGNOSIS — D692 Other nonthrombocytopenic purpura: Secondary | ICD-10-CM | POA: Diagnosis not present

## 2021-12-16 DIAGNOSIS — J45902 Unspecified asthma with status asthmaticus: Secondary | ICD-10-CM | POA: Diagnosis not present

## 2021-12-16 DIAGNOSIS — I700 Atherosclerosis of aorta: Secondary | ICD-10-CM | POA: Diagnosis not present

## 2021-12-16 DIAGNOSIS — Z03818 Encounter for observation for suspected exposure to other biological agents ruled out: Secondary | ICD-10-CM | POA: Diagnosis not present

## 2021-12-16 DIAGNOSIS — H6121 Impacted cerumen, right ear: Secondary | ICD-10-CM | POA: Diagnosis not present

## 2021-12-16 DIAGNOSIS — E119 Type 2 diabetes mellitus without complications: Secondary | ICD-10-CM | POA: Diagnosis not present

## 2022-01-25 DIAGNOSIS — S81812A Laceration without foreign body, left lower leg, initial encounter: Secondary | ICD-10-CM | POA: Diagnosis not present

## 2022-02-05 DIAGNOSIS — Z4802 Encounter for removal of sutures: Secondary | ICD-10-CM | POA: Diagnosis not present

## 2022-02-24 ENCOUNTER — Ambulatory Visit
Admission: RE | Admit: 2022-02-24 | Discharge: 2022-02-24 | Disposition: A | Discharge status: Home / Self Care | Payer: Medicare HMO | Source: Ambulatory Visit | Attending: Internal Medicine | Admitting: Internal Medicine

## 2022-02-24 DIAGNOSIS — Z1231 Encounter for screening mammogram for malignant neoplasm of breast: Secondary | ICD-10-CM | POA: Diagnosis not present

## 2022-03-10 DIAGNOSIS — E119 Type 2 diabetes mellitus without complications: Secondary | ICD-10-CM | POA: Diagnosis not present

## 2022-03-20 DIAGNOSIS — Z1389 Encounter for screening for other disorder: Secondary | ICD-10-CM | POA: Diagnosis not present

## 2022-03-20 DIAGNOSIS — G4733 Obstructive sleep apnea (adult) (pediatric): Secondary | ICD-10-CM | POA: Diagnosis not present

## 2022-03-20 DIAGNOSIS — I700 Atherosclerosis of aorta: Secondary | ICD-10-CM | POA: Diagnosis not present

## 2022-03-20 DIAGNOSIS — Z9989 Dependence on other enabling machines and devices: Secondary | ICD-10-CM | POA: Diagnosis not present

## 2022-03-20 DIAGNOSIS — Z0000 Encounter for general adult medical examination without abnormal findings: Secondary | ICD-10-CM | POA: Diagnosis not present

## 2022-03-20 DIAGNOSIS — E119 Type 2 diabetes mellitus without complications: Secondary | ICD-10-CM | POA: Diagnosis not present

## 2022-03-20 DIAGNOSIS — D692 Other nonthrombocytopenic purpura: Secondary | ICD-10-CM | POA: Diagnosis not present

## 2022-03-20 DIAGNOSIS — L570 Actinic keratosis: Secondary | ICD-10-CM | POA: Diagnosis not present

## 2022-04-17 DIAGNOSIS — R112 Nausea with vomiting, unspecified: Secondary | ICD-10-CM | POA: Diagnosis not present

## 2022-04-17 DIAGNOSIS — R1031 Right lower quadrant pain: Secondary | ICD-10-CM | POA: Diagnosis not present

## 2022-04-17 DIAGNOSIS — R197 Diarrhea, unspecified: Secondary | ICD-10-CM | POA: Diagnosis not present

## 2022-09-17 DIAGNOSIS — E119 Type 2 diabetes mellitus without complications: Secondary | ICD-10-CM | POA: Diagnosis not present

## 2022-09-24 DIAGNOSIS — M5116 Intervertebral disc disorders with radiculopathy, lumbar region: Secondary | ICD-10-CM | POA: Diagnosis not present

## 2022-09-24 DIAGNOSIS — Z0000 Encounter for general adult medical examination without abnormal findings: Secondary | ICD-10-CM | POA: Diagnosis not present

## 2022-09-24 DIAGNOSIS — E119 Type 2 diabetes mellitus without complications: Secondary | ICD-10-CM | POA: Diagnosis not present

## 2022-11-12 DIAGNOSIS — R300 Dysuria: Secondary | ICD-10-CM | POA: Diagnosis not present

## 2023-02-01 DIAGNOSIS — I700 Atherosclerosis of aorta: Secondary | ICD-10-CM | POA: Diagnosis not present

## 2023-02-01 DIAGNOSIS — E0841 Diabetes mellitus due to underlying condition with diabetic mononeuropathy: Secondary | ICD-10-CM | POA: Diagnosis not present

## 2023-02-01 DIAGNOSIS — D692 Other nonthrombocytopenic purpura: Secondary | ICD-10-CM | POA: Diagnosis not present

## 2023-03-10 DIAGNOSIS — I10 Essential (primary) hypertension: Secondary | ICD-10-CM | POA: Diagnosis not present

## 2023-03-10 DIAGNOSIS — R053 Chronic cough: Secondary | ICD-10-CM | POA: Diagnosis not present

## 2023-03-10 DIAGNOSIS — R059 Cough, unspecified: Secondary | ICD-10-CM | POA: Diagnosis not present

## 2023-03-30 DIAGNOSIS — Z1331 Encounter for screening for depression: Secondary | ICD-10-CM | POA: Diagnosis not present

## 2023-03-30 DIAGNOSIS — I10 Essential (primary) hypertension: Secondary | ICD-10-CM | POA: Diagnosis not present

## 2023-03-30 DIAGNOSIS — M5416 Radiculopathy, lumbar region: Secondary | ICD-10-CM | POA: Diagnosis not present

## 2023-03-30 DIAGNOSIS — Z0000 Encounter for general adult medical examination without abnormal findings: Secondary | ICD-10-CM | POA: Diagnosis not present

## 2023-03-30 DIAGNOSIS — E119 Type 2 diabetes mellitus without complications: Secondary | ICD-10-CM | POA: Diagnosis not present

## 2023-04-07 DIAGNOSIS — R350 Frequency of micturition: Secondary | ICD-10-CM | POA: Diagnosis not present

## 2023-04-07 DIAGNOSIS — R1011 Right upper quadrant pain: Secondary | ICD-10-CM | POA: Diagnosis not present

## 2023-04-07 DIAGNOSIS — M545 Low back pain, unspecified: Secondary | ICD-10-CM | POA: Diagnosis not present

## 2023-04-07 DIAGNOSIS — R112 Nausea with vomiting, unspecified: Secondary | ICD-10-CM | POA: Diagnosis not present

## 2023-04-09 DIAGNOSIS — R1031 Right lower quadrant pain: Secondary | ICD-10-CM | POA: Diagnosis not present

## 2023-04-09 DIAGNOSIS — R5383 Other fatigue: Secondary | ICD-10-CM | POA: Diagnosis not present

## 2023-04-09 DIAGNOSIS — J029 Acute pharyngitis, unspecified: Secondary | ICD-10-CM | POA: Diagnosis not present

## 2023-04-09 DIAGNOSIS — Z03818 Encounter for observation for suspected exposure to other biological agents ruled out: Secondary | ICD-10-CM | POA: Diagnosis not present

## 2023-04-09 DIAGNOSIS — B370 Candidal stomatitis: Secondary | ICD-10-CM | POA: Diagnosis not present

## 2023-05-15 DIAGNOSIS — E119 Type 2 diabetes mellitus without complications: Secondary | ICD-10-CM | POA: Diagnosis not present

## 2023-05-15 DIAGNOSIS — R8290 Unspecified abnormal findings in urine: Secondary | ICD-10-CM | POA: Diagnosis not present

## 2023-05-15 DIAGNOSIS — R350 Frequency of micturition: Secondary | ICD-10-CM | POA: Diagnosis not present

## 2023-06-01 DIAGNOSIS — M7912 Myalgia of auxiliary muscles, head and neck: Secondary | ICD-10-CM | POA: Diagnosis not present

## 2023-06-01 DIAGNOSIS — R0789 Other chest pain: Secondary | ICD-10-CM | POA: Diagnosis not present

## 2023-06-11 DIAGNOSIS — S81811A Laceration without foreign body, right lower leg, initial encounter: Secondary | ICD-10-CM | POA: Diagnosis not present

## 2023-06-11 DIAGNOSIS — S60221A Contusion of right hand, initial encounter: Secondary | ICD-10-CM | POA: Diagnosis not present

## 2023-06-11 DIAGNOSIS — R0781 Pleurodynia: Secondary | ICD-10-CM | POA: Diagnosis not present

## 2023-06-11 DIAGNOSIS — S299XXA Unspecified injury of thorax, initial encounter: Secondary | ICD-10-CM | POA: Diagnosis not present

## 2023-06-11 DIAGNOSIS — W010XXA Fall on same level from slipping, tripping and stumbling without subsequent striking against object, initial encounter: Secondary | ICD-10-CM | POA: Diagnosis not present

## 2023-06-11 DIAGNOSIS — S60211A Contusion of right wrist, initial encounter: Secondary | ICD-10-CM | POA: Diagnosis not present

## 2023-06-11 DIAGNOSIS — I700 Atherosclerosis of aorta: Secondary | ICD-10-CM | POA: Diagnosis not present

## 2023-07-03 DIAGNOSIS — S81811A Laceration without foreign body, right lower leg, initial encounter: Secondary | ICD-10-CM | POA: Diagnosis not present

## 2023-07-03 DIAGNOSIS — L03115 Cellulitis of right lower limb: Secondary | ICD-10-CM | POA: Diagnosis not present

## 2023-07-18 DIAGNOSIS — S81812A Laceration without foreign body, left lower leg, initial encounter: Secondary | ICD-10-CM | POA: Diagnosis not present

## 2023-10-01 DIAGNOSIS — E119 Type 2 diabetes mellitus without complications: Secondary | ICD-10-CM | POA: Diagnosis not present

## 2023-10-08 DIAGNOSIS — R053 Chronic cough: Secondary | ICD-10-CM | POA: Diagnosis not present

## 2023-10-08 DIAGNOSIS — I700 Atherosclerosis of aorta: Secondary | ICD-10-CM | POA: Diagnosis not present

## 2023-10-08 DIAGNOSIS — Z0000 Encounter for general adult medical examination without abnormal findings: Secondary | ICD-10-CM | POA: Diagnosis not present

## 2023-10-08 DIAGNOSIS — E119 Type 2 diabetes mellitus without complications: Secondary | ICD-10-CM | POA: Diagnosis not present

## 2023-10-29 DIAGNOSIS — M5412 Radiculopathy, cervical region: Secondary | ICD-10-CM | POA: Diagnosis not present

## 2023-10-29 DIAGNOSIS — A084 Viral intestinal infection, unspecified: Secondary | ICD-10-CM | POA: Diagnosis not present

## 2023-11-06 ENCOUNTER — Encounter: Payer: Self-pay | Admitting: Physician Assistant

## 2023-11-06 ENCOUNTER — Emergency Department: Payer: Medicare HMO

## 2023-11-06 ENCOUNTER — Emergency Department
Admission: EM | Admit: 2023-11-06 | Discharge: 2023-11-06 | Disposition: A | Discharge status: Home / Self Care | Payer: Medicare HMO | Attending: Emergency Medicine | Admitting: Emergency Medicine

## 2023-11-06 ENCOUNTER — Other Ambulatory Visit: Payer: Self-pay

## 2023-11-06 DIAGNOSIS — R079 Chest pain, unspecified: Secondary | ICD-10-CM | POA: Diagnosis not present

## 2023-11-06 DIAGNOSIS — R9389 Abnormal findings on diagnostic imaging of other specified body structures: Secondary | ICD-10-CM | POA: Diagnosis not present

## 2023-11-06 DIAGNOSIS — J449 Chronic obstructive pulmonary disease, unspecified: Secondary | ICD-10-CM | POA: Diagnosis not present

## 2023-11-06 DIAGNOSIS — R7989 Other specified abnormal findings of blood chemistry: Secondary | ICD-10-CM | POA: Insufficient documentation

## 2023-11-06 DIAGNOSIS — I2510 Atherosclerotic heart disease of native coronary artery without angina pectoris: Secondary | ICD-10-CM | POA: Insufficient documentation

## 2023-11-06 DIAGNOSIS — I509 Heart failure, unspecified: Secondary | ICD-10-CM | POA: Insufficient documentation

## 2023-11-06 DIAGNOSIS — E119 Type 2 diabetes mellitus without complications: Secondary | ICD-10-CM | POA: Diagnosis not present

## 2023-11-06 DIAGNOSIS — R059 Cough, unspecified: Secondary | ICD-10-CM | POA: Diagnosis not present

## 2023-11-06 DIAGNOSIS — I110 Hypertensive heart disease with heart failure: Secondary | ICD-10-CM | POA: Insufficient documentation

## 2023-11-06 DIAGNOSIS — I700 Atherosclerosis of aorta: Secondary | ICD-10-CM | POA: Diagnosis not present

## 2023-11-06 LAB — CBC
HCT: 38.2 % (ref 36.0–46.0)
Hemoglobin: 12.3 g/dL (ref 12.0–15.0)
MCH: 28.5 pg (ref 26.0–34.0)
MCHC: 32.2 g/dL (ref 30.0–36.0)
MCV: 88.4 fL (ref 80.0–100.0)
Platelets: 227 10*3/uL (ref 150–400)
RBC: 4.32 MIL/uL (ref 3.87–5.11)
RDW: 13.2 % (ref 11.5–15.5)
WBC: 10.8 10*3/uL — ABNORMAL HIGH (ref 4.0–10.5)
nRBC: 0 % (ref 0.0–0.2)

## 2023-11-06 LAB — BASIC METABOLIC PANEL
Anion gap: 8 (ref 5–15)
BUN: 15 mg/dL (ref 8–23)
CO2: 21 mmol/L — ABNORMAL LOW (ref 22–32)
Calcium: 8.9 mg/dL (ref 8.9–10.3)
Chloride: 108 mmol/L (ref 98–111)
Creatinine, Ser: 0.83 mg/dL (ref 0.44–1.00)
GFR, Estimated: >60 mL/min (ref >60)
Glucose, Bld: 105 mg/dL — ABNORMAL HIGH (ref 70–99)
Potassium: 3.5 mmol/L (ref 3.5–5.1)
Sodium: 137 mmol/L (ref 135–145)

## 2023-11-06 LAB — TROPONIN I (HIGH SENSITIVITY)
Troponin I (High Sensitivity): 8 ng/L (ref <18)
Troponin I (High Sensitivity): 8 ng/L (ref <18)

## 2023-11-06 LAB — BRAIN NATRIURETIC PEPTIDE: B Natriuretic Peptide: 561.1 pg/mL — ABNORMAL HIGH (ref 0.0–100.0)

## 2023-11-06 MED ORDER — IPRATROPIUM-ALBUTEROL 0.5-2.5 (3) MG/3ML IN SOLN
3.0000 mL | Freq: Once | RESPIRATORY_TRACT | Status: AC
  Administered 2023-11-06: 3 mL via RESPIRATORY_TRACT
  Filled 2023-11-06: qty 3

## 2023-11-06 MED ORDER — FUROSEMIDE 40 MG PO TABS
40.0000 mg | ORAL_TABLET | Freq: Once | ORAL | Status: AC
Start: 2023-11-06 — End: 2023-11-06
  Administered 2023-11-06: 40 mg via ORAL
  Filled 2023-11-06: qty 1

## 2023-11-06 MED ORDER — CLONIDINE HCL 0.1 MG PO TABS
0.1000 mg | ORAL_TABLET | Freq: Once | ORAL | Status: DC
  Filled 2023-11-06: qty 1

## 2023-11-06 MED ORDER — METOPROLOL SUCCINATE ER 50 MG PO TB24
25.0000 mg | ORAL_TABLET | Freq: Every day | ORAL | Status: DC
  Administered 2023-11-06: 25 mg via ORAL
  Filled 2023-11-06: qty 1

## 2023-11-06 NOTE — ED Provider Notes (Signed)
[facility] Emergency Department Provider Note     Event Date/Time   First MD Initiated Contact with Patient 11/06/23 1713     (approximate)   History   Chest Pain and Cough   HPI  Cathy Hines is a 77 y.o. female with a history of HTN, CAD, GERD, HLD, DM and COPD, who presents to the ED from [facility], for evaluation of chronic chest pain.  She would also endorse a chronic cough.  According to the patient she has had none of her prescribed blood pressure meds today.  She endorses an episode last night where she felt extremely short of breath.  Notes coughing but did not use her rescue inhaler as previously prescribed.  Patient would also endorse that she cannot remember she took her Lasix in the last 2 nights, because of her recent antibiotic course for mild cellulitis, she has spaced out her daily regular medicines.  She also reports a recent change in meds from amlodipine to minoxidil.  Patient does not routinely check her blood pressures at home, and also does not routinely check her blood sugars.       Physical Exam   Triage Vital Signs: ED Triage Vitals  Encounter Vitals Group     BP 11/06/23 1237 (!) 244/94     Systolic BP Percentile --      Diastolic BP Percentile --      Pulse Rate 11/06/23 1237 67     Resp 11/06/23 1237 16     Temp 11/06/23 1237 98.4 F (36.9 C)     Temp Source 11/06/23 1237 Oral     SpO2 11/06/23 1237 97 %     Weight 11/06/23 1237 155 lb (70.3 kg)     Height 11/06/23 1237 4\' 9"  (1.448 m)     Head Circumference --      Peak Flow --      Pain Score 11/06/23 1238 0     Pain Loc --      Pain Education --      Exclude from Growth Chart --     Most recent vital signs: Vitals:   11/06/23 2116 11/06/23 2130  BP: (!) 244/102 (!) 235/82  Pulse: 66 60  Resp:  17  Temp:  98.5 F (36.9 C)  SpO2:  96%    General Awake, no distress. NAD HEENT NCAT. PERRL. EOMI. No rhinorrhea. Mucous membranes are moist.  CV:  Good  peripheral perfusion. RRR. No CCE distally RESP:  Normal effort. CTA ABD:  No distention.    ED Results / Procedures / Treatments   Labs (all labs ordered are listed, but only abnormal results are displayed) Labs Reviewed  BASIC METABOLIC PANEL - Abnormal; Notable for the following components:      Result Value   CO2 21 (*)    Glucose, Bld 105 (*)    All other components within normal limits  CBC - Abnormal; Notable for the following components:   WBC 10.8 (*)    All other components within normal limits  BRAIN NATRIURETIC PEPTIDE - Abnormal; Notable for the following components:   B Natriuretic Peptide 561.1 (*)    All other components within normal limits  TROPONIN I (HIGH SENSITIVITY)  TROPONIN I (HIGH SENSITIVITY)    EKG  Vent. rate 66 BPM PR interval 176 ms QRS duration 76 ms QT/QTcB 398/417 ms P-R-T axes 68 89 -32 Normal sinus rhythm Possible Anterior infarct , age undetermined T wave abnormality, consider  lateral ischemia Abnormal ECG When compared with ECG of 08-Jul-2017 22:04, Questionable change in QRS axis T wave inversion more evident in Inferior leads Inverted T waves have replaced nonspecific T wave abnormality in Lateral leads No STEMI  RADIOLOGY  I personally viewed and evaluated these images as part of my medical decision making, as well as reviewing the written report by the radiologist.  ED Provider Interpretation: No acute findings  CXR  IMPRESSION: No acute cardiopulmonary disease.  PROCEDURES:  Critical Care performed: No  Procedures   MEDICATIONS ORDERED IN ED: Medications  ipratropium-albuterol (DUONEB) 0.5-2.5 (3) MG/3ML nebulizer solution 3 mL (3 mLs Nebulization Given 11/06/23 1805)  furosemide (LASIX) tablet 40 mg (40 mg Oral Given 11/06/23 1837)     IMPRESSION / MDM / ASSESSMENT AND PLAN / ED COURSE  I reviewed the triage vital signs and the nursing notes.                              Differential diagnosis includes,  but is not limited to, ACS, aortic dissection, pulmonary embolism, cardiac tamponade, pneumothorax, pneumonia, pericarditis, myocarditis, GI-related causes including esophagitis/gastritis, and musculoskeletal chest wall pain.     Patient's presentation is most consistent with acute complicated illness / injury requiring diagnostic workup.  Patient's diagnosis is consistent with uncontrolled hypertension and acute CHF exacerbation with evidence of fluid overload.  Patient otherwise reassuring exam and workup at this time.  Troponins are normal x 2, BNP is mildly elevated. Patient will be discharged home with directions to increase her Lasix.  He is also encouraged to follow-up with primary provider for ongoing adjustment and management of her hypertension.  Patient is to follow up with the [facility] CHF clinic as discussed, as needed or otherwise directed. Patient is given ED precautions to return to the ED for any worsening or new symptoms.   FINAL CLINICAL IMPRESSION(S) / ED DIAGNOSES   Final diagnoses:  Acute congestive heart failure, unspecified heart failure type (HCC)     Rx / DC Orders   ED Discharge Orders     None        Note:  This document was prepared using Dragon voice recognition software and may include unintentional dictation errors.    Lissa Hoard, PA-C 11/11/23 2342    Shaune Pollack, MD 11/14/23 [phone]

## 2023-11-06 NOTE — Discharge Instructions (Addendum)
You appear to have symptoms related to acute congestive heart failure.  Overall your labs and chest x-ray are normal at this time.  Your heart failure lab is elevated.  You should increase your Lasix dose to 40 mg daily starting on Sunday morning.  Continue with your other blood pressure medicines as prescribed.  Follow-up with your PCP and the [place] heart failure clinic next week as discussed.  Return to the ED if needed.

## 2023-11-06 NOTE — ED Provider Triage Note (Signed)
Emergency Medicine Provider Triage Evaluation Note  Cathy Hines , a 77 y.o. female  was evaluated in triage.  Pt complains of elevated blood pressure, chest pain with some exertion for about a month.  Was sent here by [facility] due to elevated blood pressure.  Patient states she did not take her blood pressure medication this morning and usually takes metoprolol 25 mg.  Review of Systems  Positive:  Negative:   Physical Exam  BP (!) 244/94 (BP Location: Left Arm)   Pulse 67   Temp 98.4 F (36.9 C) (Oral)   Resp 16   Ht 4\' 9"  (1.448 m)   Wt 70.3 kg   SpO2 97%   BMI 33.54 kg/m  Gen:   Awake, no distress   Resp:  Normal effort  MSK:   Moves extremities without difficulty  Other:    Medical Decision Making  Medically screening exam initiated at 12:44 PM.  Appropriate orders placed.  Cathy Hines was informed that the remainder of the evaluation will be completed by another provider, this initial triage assessment does not replace that evaluation, and the importance of remaining in the ED until their evaluation is complete.  Ordered metoprolol 25 mg   Faythe Ghee, PA-C 11/06/23 1245

## 2023-11-06 NOTE — ED Triage Notes (Signed)
Pt states that she has been having exertional chest pain for awhile, will have to stop walking to get the pain to go away, reports chronic cough for a year, no bp meds yet today, wanted to space her medications out, was sent by walk in at [facility]

## 2023-11-09 ENCOUNTER — Emergency Department
Admission: EM | Admit: 2023-11-09 | Discharge: 2023-11-09 | Disposition: A | Discharge status: Home / Self Care | Payer: Medicare HMO | Attending: Emergency Medicine | Admitting: Emergency Medicine

## 2023-11-09 ENCOUNTER — Other Ambulatory Visit: Payer: Self-pay

## 2023-11-09 DIAGNOSIS — I2510 Atherosclerotic heart disease of native coronary artery without angina pectoris: Secondary | ICD-10-CM | POA: Diagnosis not present

## 2023-11-09 DIAGNOSIS — I10 Essential (primary) hypertension: Secondary | ICD-10-CM | POA: Diagnosis not present

## 2023-11-09 DIAGNOSIS — R7989 Other specified abnormal findings of blood chemistry: Secondary | ICD-10-CM

## 2023-11-09 DIAGNOSIS — R946 Abnormal results of thyroid function studies: Secondary | ICD-10-CM | POA: Diagnosis not present

## 2023-11-09 DIAGNOSIS — R0789 Other chest pain: Secondary | ICD-10-CM | POA: Diagnosis not present

## 2023-11-09 DIAGNOSIS — G8929 Other chronic pain: Secondary | ICD-10-CM

## 2023-11-09 LAB — BASIC METABOLIC PANEL
Anion gap: 11 (ref 5–15)
BUN: 16 mg/dL (ref 8–23)
CO2: 24 mmol/L (ref 22–32)
Calcium: 9.2 mg/dL (ref 8.9–10.3)
Chloride: 101 mmol/L (ref 98–111)
Creatinine, Ser: 1.04 mg/dL — ABNORMAL HIGH (ref 0.44–1.00)
GFR, Estimated: 55 mL/min — ABNORMAL LOW (ref >60)
Glucose, Bld: 254 mg/dL — ABNORMAL HIGH (ref 70–99)
Potassium: 3.3 mmol/L — ABNORMAL LOW (ref 3.5–5.1)
Sodium: 136 mmol/L (ref 135–145)

## 2023-11-09 LAB — CBC WITH DIFFERENTIAL/PLATELET
Abs Immature Granulocytes: 0.04 10*3/uL (ref 0.00–0.07)
Basophils Absolute: 0 10*3/uL (ref 0.0–0.1)
Basophils Relative: 0 %
Eosinophils Absolute: 0 10*3/uL (ref 0.0–0.5)
Eosinophils Relative: 0 %
HCT: 43.1 % (ref 36.0–46.0)
Hemoglobin: 13.8 g/dL (ref 12.0–15.0)
Immature Granulocytes: 0 %
Lymphocytes Relative: 11 %
Lymphs Abs: 1.3 10*3/uL (ref 0.7–4.0)
MCH: 28.4 pg (ref 26.0–34.0)
MCHC: 32 g/dL (ref 30.0–36.0)
MCV: 88.7 fL (ref 80.0–100.0)
Monocytes Absolute: 0.8 10*3/uL (ref 0.1–1.0)
Monocytes Relative: 7 %
Neutro Abs: 9.3 10*3/uL — ABNORMAL HIGH (ref 1.7–7.7)
Neutrophils Relative %: 82 %
Platelets: 250 10*3/uL (ref 150–400)
RBC: 4.86 MIL/uL (ref 3.87–5.11)
RDW: 13.1 % (ref 11.5–15.5)
WBC: 11.5 10*3/uL — ABNORMAL HIGH (ref 4.0–10.5)
nRBC: 0 % (ref 0.0–0.2)

## 2023-11-09 LAB — TROPONIN I (HIGH SENSITIVITY): Troponin I (High Sensitivity): 7 ng/L (ref <18)

## 2023-11-09 LAB — T4, FREE: Free T4: 3.15 ng/dL — ABNORMAL HIGH (ref 0.61–1.12)

## 2023-11-09 LAB — TSH: TSH: 0.136 u[IU]/mL — ABNORMAL LOW (ref 0.350–4.500)

## 2023-11-09 MED ORDER — AMLODIPINE BESYLATE 5 MG PO TABS
10.0000 mg | ORAL_TABLET | Freq: Once | ORAL | Status: AC
Start: 2023-11-09 — End: 2023-11-09
  Administered 2023-11-09: 10 mg via ORAL
  Filled 2023-11-09: qty 2

## 2023-11-09 MED ORDER — METOPROLOL SUCCINATE ER 50 MG PO TB24: 50.0000 mg | ORAL_TABLET | Freq: Every day | ORAL | 11 refills | Status: AC

## 2023-11-09 MED ORDER — LEVOTHYROXINE SODIUM 25 MCG PO TABS: 75.0000 ug | ORAL_TABLET | Freq: Every day | ORAL | 0 refills | Status: AC

## 2023-11-09 MED ORDER — METOPROLOL SUCCINATE ER 50 MG PO TB24
25.0000 mg | ORAL_TABLET | Freq: Once | ORAL | Status: AC
  Administered 2023-11-09: 25 mg via ORAL
  Filled 2023-11-09: qty 1

## 2023-11-09 NOTE — Discharge Instructions (Addendum)
Fortunately your testing in the Emergency Department did not show any emergency conditions today associated with your high blood pressure.  Thyroid levels were slightly higher than expected today.  Take half of your thyroid pill for the next few days until you are able to meet with your doctor for further advice and rechecking this level.   Increase your metoprolol to 50 mg daily.  I gave you a prescription for this medication.  If you are unable to get to the pharmacy to take double your normal dose (take 2 pills of the 25 mg metoprolol daily) and check your blood pressure only once a day either in the morning or at night when you are resting calm.  Keep a journal log of these blood pressure readings and follow-up with Dr. Hyacinth Meeker for adjustments to blood pressure medications as needed.  As we spoke of, if you experience any new worsening or expected symptoms then call your doctor right away or come back to the emergency department.  Otherwise blood pressure numbers should be reviewed with your doctor and made adjustments as needed.  Thank you for choosing [place] for your health care today!  Please see your primary doctor this week for a follow up appointment.   If you have any new, worsening, or unexpected symptoms call your doctor right away or come back to the emergency department for reevaluation.  It was my pleasure to care for you today.   Daneil Dan Modesto Charon, MD

## 2023-11-09 NOTE — ED Triage Notes (Addendum)
Pt to ED via POV from home. Pt ambulatory to triage. Pt reports HTN x2 days with HA. Pt reports hx of BP and has been taking BP meds and fluid pills as prescribed. Pt reports BP meds were changed back in November. Pt was seen recently and dx with CHF and prescribed lasix. Pt reports has not followed up with heart failure clinic. Pt denies CP or SOB.

## 2023-11-09 NOTE — ED Provider Notes (Addendum)
[facility] Provider Note    Event Date/Time   First MD Initiated Contact with Patient 11/09/23 1534     (approximate)   History   Hypertension   HPI  Cathy Hines is a 77 y.o. female   Past medical history of CAD, CVA, GERD, hypertension hyperlipidemia who presents to the emergency department with hypertension.  She is otherwise asymptomatic.  Previously she had been worked up for shortness of breath and chest pain as recently as 3 days ago in the emergency department with flat troponins, mildly elevated BNP and was asked to increase her Lasix.  Today she has no chest pain shortness of breath or any other associated symptoms and instead just checked her blood pressure and she noted it was high.  Her blood pressure was over 200 at home.  On recheck was 220/90.  Review of systems again adamantly denies chest pain, shortness of breath, headache, vision changes, motor or sensory changes.  When asked about the symptoms she was experiencing 3 days ago she states that she does have chronic unchanged exertional chest discomfort.  She has no chest pain currently.    She has been taking all of her medications as prescribed.  To the best of her knowledge her only blood pressure medicine is metoprolol 25 mg XL.  This corresponds to her internal medicine notes.   External Medical Documents Reviewed: Dr. Rondel Baton internal medicine notes from November and December 2024 with medical history of hypertension looks to be on metoprolol 25 mg XR only.      Physical Exam   Triage Vital Signs: ED Triage Vitals  Encounter Vitals Group     BP 11/09/23 1436 (!) 221/91     Systolic BP Percentile --      Diastolic BP Percentile --      Pulse Rate 11/09/23 1436 94     Resp 11/09/23 1436 20     Temp 11/09/23 1436 98 F (36.7 C)     Temp src --      SpO2 11/09/23 1436 98 %     Weight --      Height --      Head Circumference --      Peak Flow --      Pain Score  11/09/23 1437 6     Pain Loc --      Pain Education --      Exclude from Growth Chart --     Most recent vital signs: Vitals:   11/09/23 1622 11/09/23 1700  BP: (!) 216/75 (!) 201/67  Pulse: 100 98  Resp:    Temp:    SpO2:  98%    General: Awake, no distress.  CV:  Good peripheral perfusion.  Resp:  Normal effort.  Abd:  No distention.  Other:  Is hypertensive 220/90 otherwise vital signs are normal.  She is pleasant awake comfortable appearing with no respiratory distress clear lungs no heart murmurs and no focal neurologic deficits including dysarthria facial asymmetry motor or sensory changes.  She has a soft benign abdominal exam.   ED Results / Procedures / Treatments   Labs (all labs ordered are listed, but only abnormal results are displayed) Labs Reviewed  BASIC METABOLIC PANEL - Abnormal; Notable for the following components:      Result Value   Potassium 3.3 (*)    Glucose, Bld 254 (*)    Creatinine, Ser 1.04 (*)    GFR, Estimated 55 (*)  All other components within normal limits  CBC WITH DIFFERENTIAL/PLATELET - Abnormal; Notable for the following components:   WBC 11.5 (*)    Neutro Abs 9.3 (*)    All other components within normal limits  TSH - Abnormal; Notable for the following components:   TSH 0.136 (*)    All other components within normal limits  T4, FREE - Abnormal; Notable for the following components:   Free T4 3.15 (*)    All other components within normal limits  TROPONIN I (HIGH SENSITIVITY)     I ordered and reviewed the above labs they are notable for troponin normal, electrolytes and cell counts largely unremarkable, and free T4 slightly elevated at 3  EKG  ED ECG REPORT I, Pilar Jarvis, the attending physician, personally viewed and interpreted this ECG.   Date: 11/09/2023  EKG Time: 1649  Rate: 85  Rhythm: sinus  Axis: nl  Intervals:nl  ST&T Change: no stemi   PROCEDURES:  Critical Care performed:  No  Procedures   MEDICATIONS ORDERED IN ED: Medications  metoprolol succinate (TOPROL-XL) 24 hr tablet 25 mg (25 mg Oral Given 11/09/23 1622)  amLODipine (NORVASC) tablet 10 mg (10 mg Oral Given 11/09/23 1622)   IMPRESSION / MDM / ASSESSMENT AND PLAN / ED COURSE  I reviewed the triage vital signs and the nursing notes.                                Patient's presentation is most consistent with acute presentation with potential threat to life or bodily function.  Differential diagnosis includes, but is not limited to, hypertension, ACS, kidney failure, stroke   The patient is on the cardiac monitor to evaluate for evidence of arrhythmia and/or significant heart rate changes.  MDM:    Asymptomatic hypertension in this woman who is only on metoprolol at low-dose.  No acute symptoms though she does note chronic unchanged exertional chest pain with recent workup in the emergency department showing serial troponins have been flat and a nonischemic EKG.  I doubt she is having ACS.  Check EKG basic labs, check thyroid levels as well that she has a history of hypothyroid with Synthroid medication   -- Labs unremarkable except for T4 in the threes.  She notes she just re-started her thyroid meds within the past few weeks. She does not look thyrotoxic.  Again, she is completely asymptomatic.  Her blood pressure may be increased beyond baseline as a result of supratherapeutic Synthroid.  Will make some changes to her medications including double her metoprolol to 50 mg daily as well as halving her dose of thyroid medication until she is able to meet with her primary doctor for recheck of thyroid levels and review of blood pressure for further adjustments as needed.  No signs of endorgan damage associated with hypertension, no evidence of thyroid emergency, and given no acute symptoms, plan will be for discharge and adjustments as above with close PMD follow-up.     FINAL CLINICAL  IMPRESSION(S) / ED DIAGNOSES   Final diagnoses:  Uncontrolled hypertension  Chronic chest pain  Abnormal serum thyroxine (T4) level     Rx / DC Orders   ED Discharge Orders          Ordered    Ambulatory referral to Cardiology       Comments: If you have not heard from the Cardiology office within the next 72 hours please call  [phone].   11/09/23 1603    metoprolol succinate (TOPROL XL) 50 MG 24 hr tablet  Daily        11/09/23 1659    levothyroxine (SYNTHROID) 25 MCG tablet  Daily before breakfast        11/09/23 1659             Note:  This document was prepared using Dragon voice recognition software and may include unintentional dictation errors.    Pilar Jarvis, MD 11/09/23 1653    Pilar Jarvis, MD 11/09/23 1701    Pilar Jarvis, MD 11/10/23 [phone]

## 2023-11-17 DIAGNOSIS — I160 Hypertensive urgency: Secondary | ICD-10-CM | POA: Diagnosis not present

## 2023-11-17 DIAGNOSIS — Z2821 Immunization not carried out because of patient refusal: Secondary | ICD-10-CM | POA: Diagnosis not present

## 2023-12-08 DIAGNOSIS — Z2821 Immunization not carried out because of patient refusal: Secondary | ICD-10-CM | POA: Diagnosis not present

## 2023-12-08 DIAGNOSIS — E1140 Type 2 diabetes mellitus with diabetic neuropathy, unspecified: Secondary | ICD-10-CM | POA: Diagnosis not present

## 2023-12-08 DIAGNOSIS — I10 Essential (primary) hypertension: Secondary | ICD-10-CM | POA: Diagnosis not present

## 2023-12-08 DIAGNOSIS — E876 Hypokalemia: Secondary | ICD-10-CM | POA: Diagnosis not present

## 2023-12-18 DIAGNOSIS — J111 Influenza due to unidentified influenza virus with other respiratory manifestations: Secondary | ICD-10-CM | POA: Diagnosis not present

## 2023-12-18 DIAGNOSIS — Z03818 Encounter for observation for suspected exposure to other biological agents ruled out: Secondary | ICD-10-CM | POA: Diagnosis not present

## 2023-12-18 DIAGNOSIS — R110 Nausea: Secondary | ICD-10-CM | POA: Diagnosis not present

## 2023-12-18 DIAGNOSIS — J40 Bronchitis, not specified as acute or chronic: Secondary | ICD-10-CM | POA: Diagnosis not present

## 2023-12-18 DIAGNOSIS — R6889 Other general symptoms and signs: Secondary | ICD-10-CM | POA: Diagnosis not present

## 2023-12-21 DIAGNOSIS — J45909 Unspecified asthma, uncomplicated: Secondary | ICD-10-CM | POA: Diagnosis not present

## 2023-12-23 DIAGNOSIS — R197 Diarrhea, unspecified: Secondary | ICD-10-CM | POA: Diagnosis not present

## 2024-01-05 DIAGNOSIS — J45909 Unspecified asthma, uncomplicated: Secondary | ICD-10-CM | POA: Diagnosis not present

## 2024-01-20 ENCOUNTER — Emergency Department

## 2024-01-20 ENCOUNTER — Emergency Department
Admission: EM | Admit: 2024-01-20 | Discharge: 2024-01-20 | Disposition: A | Discharge status: Home / Self Care | Attending: Emergency Medicine | Admitting: Emergency Medicine

## 2024-01-20 ENCOUNTER — Other Ambulatory Visit: Payer: Self-pay

## 2024-01-20 DIAGNOSIS — S81811A Laceration without foreign body, right lower leg, initial encounter: Secondary | ICD-10-CM | POA: Insufficient documentation

## 2024-01-20 DIAGNOSIS — E039 Hypothyroidism, unspecified: Secondary | ICD-10-CM | POA: Insufficient documentation

## 2024-01-20 DIAGNOSIS — W228XXA Striking against or struck by other objects, initial encounter: Secondary | ICD-10-CM | POA: Diagnosis not present

## 2024-01-20 DIAGNOSIS — E119 Type 2 diabetes mellitus without complications: Secondary | ICD-10-CM | POA: Insufficient documentation

## 2024-01-20 DIAGNOSIS — I2510 Atherosclerotic heart disease of native coronary artery without angina pectoris: Secondary | ICD-10-CM | POA: Insufficient documentation

## 2024-01-20 DIAGNOSIS — R42 Dizziness and giddiness: Secondary | ICD-10-CM | POA: Diagnosis not present

## 2024-01-20 DIAGNOSIS — I10 Essential (primary) hypertension: Secondary | ICD-10-CM | POA: Diagnosis not present

## 2024-01-20 DIAGNOSIS — M542 Cervicalgia: Secondary | ICD-10-CM | POA: Diagnosis not present

## 2024-01-20 DIAGNOSIS — S8991XA Unspecified injury of right lower leg, initial encounter: Secondary | ICD-10-CM | POA: Diagnosis present

## 2024-01-20 DIAGNOSIS — S81819A Laceration without foreign body, unspecified lower leg, initial encounter: Secondary | ICD-10-CM

## 2024-01-20 MED ORDER — LIDOCAINE-EPINEPHRINE (PF) 2 %-1:200000 IJ SOLN
10.0000 mL | Freq: Once | INTRAMUSCULAR | Status: AC
  Administered 2024-01-20: 10 mL
  Filled 2024-01-20: qty 20

## 2024-01-20 MED ORDER — CEPHALEXIN 500 MG PO CAPS: 500.0000 mg | ORAL_CAPSULE | Freq: Four times a day (QID) | ORAL | 0 refills | Status: AC

## 2024-01-20 MED ORDER — ACETAMINOPHEN 325 MG PO TABS
650.0000 mg | ORAL_TABLET | Freq: Once | ORAL | Status: AC
  Administered 2024-01-20: 650 mg via ORAL
  Filled 2024-01-20: qty 2

## 2024-01-20 NOTE — Discharge Instructions (Addendum)
 You were evaluated in the ED for a lower leg laceration.  Your x-rays are normal.  Please review laceration care patient education packet on how to take care of your laceration at home.  Follow-up with your PCP or return to the ED for suture removal in 7 to 10 days.  Keep sutures dry for first 24 hrs. Keep suture site clean & dry. Gently use soap & water after first 24 hrs. DO NOT USE alcohol, hydrogen peroxide etc, to clean skin. You may cover the incision with clean gauze & replace it after your daily shower for your comfort. If you have skin tapes (Steristrips) or skin glue (Dermabond) on your incision, leave them in place. They will fall off on their own like a scab in a few weeks.  You may trim any edges that curl up with clean scissors.   You have been prescribed antibiotics.  Please take them as prescribed until dose is complete.  As discussed please monitor for signs and symptoms of an infection including puslike drainage from suture site, development of fever and redness that begins to spread from the site.

## 2024-01-20 NOTE — ED Triage Notes (Signed)
 Pt to ED From [facility] for laceration to right lower leg. Cut it on running board of car. Denies blood thinners. Bleeding controlled, wrapped at this time.

## 2024-01-20 NOTE — ED Provider Notes (Signed)
 [facility] Emergency Department Provider Note     Event Date/Time   First MD Initiated Contact with Patient 01/20/24 1423     (approximate)   History   Laceration   HPI  Cathy Hines is a 78 y.o. female with a history of diabetes, HTN, CAD, and hypothyroidism presents to the ED for evaluation of a lower right leg injury sustaining a laceration and skin tear.  Patient reports she was getting into a truck and hit her lower right leg on the side of a metal running board attache to the truck. She was evaluated at [facility] and sent to the ED for further evaluation. Patient denies being on blood thinners.     Physical Exam   Triage Vital Signs: ED Triage Vitals  Encounter Vitals Group     BP 01/20/24 1415 135/61     Systolic BP Percentile --      Diastolic BP Percentile --      Pulse Rate 01/20/24 1415 92     Resp 01/20/24 1415 16     Temp 01/20/24 1415 98.2 F (36.8 C)     Temp src --      SpO2 01/20/24 1415 96 %     Weight 01/20/24 1414 146 lb (66.2 kg)     Height 01/20/24 1414 4\' 10"  (1.473 m)     Head Circumference --      Peak Flow --      Pain Score 01/20/24 1414 6     Pain Loc --      Pain Education --      Exclude from Growth Chart --     Most recent vital signs: Vitals:   01/20/24 1415  BP: 135/61  Pulse: 92  Resp: 16  Temp: 98.2 F (36.8 C)  SpO2: 96%    General Awake, no distress.  HEENT NCAT.  CV:  Good peripheral perfusion.  RESP:  Normal effort.  ABD:  No distention.  Other:  Right LE reveals a ~ 10 cm noncircumferential skin tear/laceration on the distal posterior-lateral region.    ED Results / Procedures / Treatments   Labs (all labs ordered are listed, but only abnormal results are displayed) Labs Reviewed - No data to display  RADIOLOGY  I personally viewed and evaluated these images as part of my medical decision making, as well as reviewing the written report by the radiologist.  ED Provider  Interpretation: No acute bony abnormality.  No foreign body.  DG Tibia/Fibula Right Result Date: 01/20/2024 CLINICAL DATA:  Leg injury/laceration. EXAM: RIGHT TIBIA AND FIBULA - 2 VIEW COMPARISON:  None Available. FINDINGS: Soft tissue laceration along the posteroinferior lower leg. No underlying osseous abnormality or radiopaque foreign body. IMPRESSION: Soft tissue laceration along the posteroinferior lower leg. No radiopaque foreign body or fracture. Electronically Signed   By: Leanna Battles M.D.   On: 01/20/2024 17:54   PROCEDURES:  Critical Care performed: No  .Laceration Repair  Date/Time: 01/20/2024 6:08 PM  Performed by: Conrad [place], PA-C Authorized by: Conrad Solon, PA-C   Consent:    Consent obtained:  Verbal   Consent given by:  Patient   Risks discussed:  Infection, pain, poor cosmetic result, vascular damage and poor wound healing Anesthesia:    Anesthesia method:  Local infiltration   Local anesthetic:  Lidocaine 2% WITH epi Laceration details:    Location:  Leg   Leg location:  R lower leg   Length (cm):  10  Depth (mm):  8 Exploration:    Hemostasis achieved with:  Direct pressure   Imaging obtained: x-ray     Imaging outcome: foreign body not noted     Wound exploration: entire depth of wound visualized     Contaminated: no   Treatment:    Area cleansed with:  Saline   Amount of cleaning:  Extensive   Irrigation solution:  Sterile saline   Irrigation method:  Pressure wash   Layers/structures repaired:  Deep subcutaneous Deep subcutaneous:    Suture size:  4-0   Suture material:  Vicryl   Suture technique:  Simple interrupted   Number of sutures:  4 Skin repair:    Repair method:  Sutures   Suture size:  4-0   Suture material:  Nylon   Suture technique:  Simple interrupted   Number of sutures:  12 Approximation:    Approximation:  Close Repair type:    Repair type:  Intermediate Post-procedure details:    Dressing:  Non-adherent  dressing   Procedure completion:  Tolerated   MEDICATIONS ORDERED IN ED: Medications  lidocaine-EPINEPHrine (XYLOCAINE W/EPI) 2 %-1:200000 (PF) injection 10 mL (has no administration in time range)  acetaminophen (TYLENOL) tablet 650 mg (650 mg Oral Given 01/20/24 1518)   IMPRESSION / MDM / ASSESSMENT AND PLAN / ED COURSE  I reviewed the triage vital signs and the nursing notes.                               78 y.o. female presents to the emergency department for evaluation and treatment of laceration. See HPI for further details.   Differential diagnosis includes, but is not limited to laceration, skin tear, fracture, foreign body  Patient's presentation is most consistent with acute complicated illness / injury requiring diagnostic workup.  Patient is alert and oriented.  She is hemodynamically stable.  Patient's presentation is clinically consistent with a laceration to the distal posterior lateral region of her right lower extremity.  Given the fragileness of her skin, there is a skin flap.  The skin flap, once flipped over completely reveals a deep laceration through the subcutaneous aspect of the lower extremity. No bone or tendon exposure can be visualized.   X-rays obtained and is reassuring.   Please see procedure note.  Patient tolerated laceration repair well with no complications.  Given her skin being thin and fragile, Steri-Strips were used parallel to the laceration to reduce tension.   Laceration care and wound care education provided to patient.  She is advised to follow-up with her PCP or return to the ED in 7 to 10 days for suture removal.  She is put on Keflex for infection prevention.  She is in stable condition for discharge home.  Patient is ambulatory.  ED return precautions discussed thoroughly.  All questions concerns were addressed during this ED visit.  FINAL CLINICAL IMPRESSION(S) / ED DIAGNOSES   Final diagnoses:  Flap laceration of lower extremity   Rx /  DC Orders   ED Discharge Orders          Ordered    cephALEXin (KEFLEX) 500 MG capsule  4 times daily        01/20/24 1810            Note:  This document was prepared using Dragon voice recognition software and may include unintentional dictation errors. Romeo Apple, Dewaun Kinzler A, PA-C 01/20/24 1818  Janith Lima, MD 01/21/24 [phone]

## 2024-02-01 DIAGNOSIS — S81811D Laceration without foreign body, right lower leg, subsequent encounter: Secondary | ICD-10-CM | POA: Diagnosis not present

## 2024-02-01 DIAGNOSIS — Z4802 Encounter for removal of sutures: Secondary | ICD-10-CM | POA: Diagnosis not present

## 2024-02-10 DIAGNOSIS — E119 Type 2 diabetes mellitus without complications: Secondary | ICD-10-CM | POA: Diagnosis not present

## 2024-02-10 DIAGNOSIS — S81811D Laceration without foreign body, right lower leg, subsequent encounter: Secondary | ICD-10-CM | POA: Diagnosis not present

## 2024-02-10 DIAGNOSIS — L03115 Cellulitis of right lower limb: Secondary | ICD-10-CM | POA: Diagnosis not present

## 2024-02-12 DIAGNOSIS — S81811D Laceration without foreign body, right lower leg, subsequent encounter: Secondary | ICD-10-CM | POA: Diagnosis not present

## 2024-02-12 DIAGNOSIS — L03115 Cellulitis of right lower limb: Secondary | ICD-10-CM | POA: Diagnosis not present

## 2024-03-08 DIAGNOSIS — R300 Dysuria: Secondary | ICD-10-CM | POA: Diagnosis not present

## 2024-03-24 DIAGNOSIS — L989 Disorder of the skin and subcutaneous tissue, unspecified: Secondary | ICD-10-CM | POA: Diagnosis not present

## 2024-03-24 DIAGNOSIS — S90512A Abrasion, left ankle, initial encounter: Secondary | ICD-10-CM | POA: Diagnosis not present

## 2024-03-24 DIAGNOSIS — S81811D Laceration without foreign body, right lower leg, subsequent encounter: Secondary | ICD-10-CM | POA: Diagnosis not present

## 2024-04-01 DIAGNOSIS — M25572 Pain in left ankle and joints of left foot: Secondary | ICD-10-CM | POA: Diagnosis not present

## 2024-04-01 DIAGNOSIS — S90512D Abrasion, left ankle, subsequent encounter: Secondary | ICD-10-CM | POA: Diagnosis not present

## 2024-04-01 DIAGNOSIS — M5416 Radiculopathy, lumbar region: Secondary | ICD-10-CM | POA: Diagnosis not present

## 2024-04-05 ENCOUNTER — Ambulatory Visit
Admission: RE | Admit: 2024-04-05 | Discharge: 2024-04-05 | Disposition: A | Discharge status: Home / Self Care | Source: Ambulatory Visit | Attending: Family Medicine | Admitting: Family Medicine

## 2024-04-05 ENCOUNTER — Other Ambulatory Visit: Payer: Self-pay | Admitting: Family Medicine

## 2024-04-05 DIAGNOSIS — R112 Nausea with vomiting, unspecified: Secondary | ICD-10-CM

## 2024-04-05 DIAGNOSIS — R197 Diarrhea, unspecified: Secondary | ICD-10-CM | POA: Diagnosis not present

## 2024-04-05 DIAGNOSIS — S81811D Laceration without foreign body, right lower leg, subsequent encounter: Secondary | ICD-10-CM | POA: Diagnosis not present

## 2024-04-05 DIAGNOSIS — R1084 Generalized abdominal pain: Secondary | ICD-10-CM

## 2024-04-05 DIAGNOSIS — Z90710 Acquired absence of both cervix and uterus: Secondary | ICD-10-CM | POA: Diagnosis not present

## 2024-04-05 DIAGNOSIS — R10813 Right lower quadrant abdominal tenderness: Secondary | ICD-10-CM | POA: Diagnosis not present

## 2024-04-06 DIAGNOSIS — K5989 Other specified functional intestinal disorders: Secondary | ICD-10-CM | POA: Diagnosis not present

## 2024-04-06 DIAGNOSIS — R112 Nausea with vomiting, unspecified: Secondary | ICD-10-CM | POA: Diagnosis not present

## 2024-04-06 DIAGNOSIS — R197 Diarrhea, unspecified: Secondary | ICD-10-CM | POA: Diagnosis not present

## 2024-04-13 DIAGNOSIS — E119 Type 2 diabetes mellitus without complications: Secondary | ICD-10-CM | POA: Diagnosis not present

## 2024-04-13 DIAGNOSIS — Z1331 Encounter for screening for depression: Secondary | ICD-10-CM | POA: Diagnosis not present

## 2024-04-13 DIAGNOSIS — Z0000 Encounter for general adult medical examination without abnormal findings: Secondary | ICD-10-CM | POA: Diagnosis not present

## 2024-04-13 DIAGNOSIS — N289 Disorder of kidney and ureter, unspecified: Secondary | ICD-10-CM | POA: Diagnosis not present

## 2024-05-03 DIAGNOSIS — D485 Neoplasm of uncertain behavior of skin: Secondary | ICD-10-CM | POA: Diagnosis not present

## 2024-05-03 DIAGNOSIS — L814 Other melanin hyperpigmentation: Secondary | ICD-10-CM | POA: Diagnosis not present

## 2024-05-03 DIAGNOSIS — L821 Other seborrheic keratosis: Secondary | ICD-10-CM | POA: Diagnosis not present

## 2024-05-03 DIAGNOSIS — L820 Inflamed seborrheic keratosis: Secondary | ICD-10-CM | POA: Diagnosis not present

## 2024-05-03 DIAGNOSIS — L578 Other skin changes due to chronic exposure to nonionizing radiation: Secondary | ICD-10-CM | POA: Diagnosis not present

## 2024-05-11 DIAGNOSIS — E119 Type 2 diabetes mellitus without complications: Secondary | ICD-10-CM | POA: Diagnosis not present

## 2024-05-11 DIAGNOSIS — S81801D Unspecified open wound, right lower leg, subsequent encounter: Secondary | ICD-10-CM | POA: Diagnosis not present

## 2024-05-17 ENCOUNTER — Other Ambulatory Visit: Payer: Self-pay | Admitting: Physician Assistant

## 2024-05-17 ENCOUNTER — Ambulatory Visit
Admission: RE | Admit: 2024-05-17 | Discharge: 2024-05-17 | Disposition: A | Discharge status: Home / Self Care | Source: Ambulatory Visit | Attending: Physician Assistant | Admitting: Physician Assistant

## 2024-05-17 DIAGNOSIS — M79661 Pain in right lower leg: Secondary | ICD-10-CM | POA: Diagnosis not present

## 2024-05-17 DIAGNOSIS — M7989 Other specified soft tissue disorders: Secondary | ICD-10-CM

## 2024-05-17 DIAGNOSIS — L03115 Cellulitis of right lower limb: Secondary | ICD-10-CM | POA: Diagnosis not present

## 2024-05-30 DIAGNOSIS — G629 Polyneuropathy, unspecified: Secondary | ICD-10-CM | POA: Diagnosis not present

## 2024-05-30 DIAGNOSIS — I10 Essential (primary) hypertension: Secondary | ICD-10-CM | POA: Diagnosis not present

## 2024-05-30 DIAGNOSIS — S81811D Laceration without foreign body, right lower leg, subsequent encounter: Secondary | ICD-10-CM | POA: Diagnosis not present

## 2024-06-06 DIAGNOSIS — S81811D Laceration without foreign body, right lower leg, subsequent encounter: Secondary | ICD-10-CM | POA: Diagnosis not present

## 2024-06-06 DIAGNOSIS — G629 Polyneuropathy, unspecified: Secondary | ICD-10-CM | POA: Diagnosis not present

## 2024-06-06 DIAGNOSIS — S51819A Laceration without foreign body of unspecified forearm, initial encounter: Secondary | ICD-10-CM | POA: Diagnosis not present

## 2024-06-06 DIAGNOSIS — I10 Essential (primary) hypertension: Secondary | ICD-10-CM | POA: Diagnosis not present

## 2024-06-14 DIAGNOSIS — S81801D Unspecified open wound, right lower leg, subsequent encounter: Secondary | ICD-10-CM | POA: Diagnosis not present

## 2024-06-15 DIAGNOSIS — D3131 Benign neoplasm of right choroid: Secondary | ICD-10-CM | POA: Diagnosis not present

## 2024-06-15 DIAGNOSIS — H2513 Age-related nuclear cataract, bilateral: Secondary | ICD-10-CM | POA: Diagnosis not present

## 2024-06-15 DIAGNOSIS — E119 Type 2 diabetes mellitus without complications: Secondary | ICD-10-CM | POA: Diagnosis not present

## 2024-06-17 DIAGNOSIS — S81801D Unspecified open wound, right lower leg, subsequent encounter: Secondary | ICD-10-CM | POA: Diagnosis not present

## 2024-06-17 DIAGNOSIS — E119 Type 2 diabetes mellitus without complications: Secondary | ICD-10-CM | POA: Diagnosis not present

## 2024-06-23 DIAGNOSIS — E119 Type 2 diabetes mellitus without complications: Secondary | ICD-10-CM | POA: Diagnosis not present

## 2024-06-23 DIAGNOSIS — S81801D Unspecified open wound, right lower leg, subsequent encounter: Secondary | ICD-10-CM | POA: Diagnosis not present

## 2024-06-29 ENCOUNTER — Encounter: Attending: Physician Assistant | Admitting: Physician Assistant

## 2024-06-29 DIAGNOSIS — E11622 Type 2 diabetes mellitus with other skin ulcer: Secondary | ICD-10-CM | POA: Insufficient documentation

## 2024-06-29 DIAGNOSIS — I480 Paroxysmal atrial fibrillation: Secondary | ICD-10-CM | POA: Insufficient documentation

## 2024-06-29 DIAGNOSIS — I2510 Atherosclerotic heart disease of native coronary artery without angina pectoris: Secondary | ICD-10-CM | POA: Insufficient documentation

## 2024-06-29 DIAGNOSIS — S81811A Laceration without foreign body, right lower leg, initial encounter: Secondary | ICD-10-CM | POA: Diagnosis not present

## 2024-06-29 DIAGNOSIS — I890 Lymphedema, not elsewhere classified: Secondary | ICD-10-CM | POA: Insufficient documentation

## 2024-06-29 DIAGNOSIS — L97812 Non-pressure chronic ulcer of other part of right lower leg with fat layer exposed: Secondary | ICD-10-CM | POA: Insufficient documentation

## 2024-06-29 DIAGNOSIS — I10 Essential (primary) hypertension: Secondary | ICD-10-CM | POA: Insufficient documentation

## 2024-06-29 DIAGNOSIS — Z87891 Personal history of nicotine dependence: Secondary | ICD-10-CM | POA: Diagnosis not present

## 2024-06-29 DIAGNOSIS — W500XXA Accidental hit or strike by another person, initial encounter: Secondary | ICD-10-CM | POA: Insufficient documentation

## 2024-07-06 ENCOUNTER — Encounter: Admitting: Physician Assistant

## 2024-07-06 DIAGNOSIS — I890 Lymphedema, not elsewhere classified: Secondary | ICD-10-CM | POA: Diagnosis not present

## 2024-07-06 DIAGNOSIS — E11622 Type 2 diabetes mellitus with other skin ulcer: Secondary | ICD-10-CM | POA: Diagnosis not present

## 2024-07-06 DIAGNOSIS — I480 Paroxysmal atrial fibrillation: Secondary | ICD-10-CM | POA: Diagnosis not present

## 2024-07-06 DIAGNOSIS — I2510 Atherosclerotic heart disease of native coronary artery without angina pectoris: Secondary | ICD-10-CM | POA: Diagnosis not present

## 2024-07-06 DIAGNOSIS — Z87891 Personal history of nicotine dependence: Secondary | ICD-10-CM | POA: Diagnosis not present

## 2024-07-06 DIAGNOSIS — S81811A Laceration without foreign body, right lower leg, initial encounter: Secondary | ICD-10-CM | POA: Diagnosis not present

## 2024-07-06 DIAGNOSIS — I10 Essential (primary) hypertension: Secondary | ICD-10-CM | POA: Diagnosis not present

## 2024-07-06 DIAGNOSIS — L97812 Non-pressure chronic ulcer of other part of right lower leg with fat layer exposed: Secondary | ICD-10-CM | POA: Diagnosis not present

## 2024-07-07 DIAGNOSIS — I480 Paroxysmal atrial fibrillation: Secondary | ICD-10-CM | POA: Diagnosis not present

## 2024-07-07 DIAGNOSIS — E11622 Type 2 diabetes mellitus with other skin ulcer: Secondary | ICD-10-CM | POA: Diagnosis not present

## 2024-07-07 DIAGNOSIS — I10 Essential (primary) hypertension: Secondary | ICD-10-CM | POA: Diagnosis not present

## 2024-07-07 DIAGNOSIS — L97812 Non-pressure chronic ulcer of other part of right lower leg with fat layer exposed: Secondary | ICD-10-CM | POA: Diagnosis not present

## 2024-07-07 DIAGNOSIS — I2510 Atherosclerotic heart disease of native coronary artery without angina pectoris: Secondary | ICD-10-CM | POA: Diagnosis not present

## 2024-07-07 DIAGNOSIS — I890 Lymphedema, not elsewhere classified: Secondary | ICD-10-CM | POA: Diagnosis not present

## 2024-07-07 DIAGNOSIS — S81811A Laceration without foreign body, right lower leg, initial encounter: Secondary | ICD-10-CM | POA: Diagnosis not present

## 2024-07-11 DIAGNOSIS — I890 Lymphedema, not elsewhere classified: Secondary | ICD-10-CM | POA: Diagnosis not present

## 2024-07-11 DIAGNOSIS — R0789 Other chest pain: Secondary | ICD-10-CM | POA: Diagnosis not present

## 2024-07-11 DIAGNOSIS — I10 Essential (primary) hypertension: Secondary | ICD-10-CM | POA: Diagnosis not present

## 2024-07-11 DIAGNOSIS — F439 Reaction to severe stress, unspecified: Secondary | ICD-10-CM | POA: Diagnosis not present

## 2024-07-12 ENCOUNTER — Ambulatory Visit: Admitting: Physician Assistant

## 2024-07-13 ENCOUNTER — Encounter: Admitting: Internal Medicine

## 2024-07-13 DIAGNOSIS — I10 Essential (primary) hypertension: Secondary | ICD-10-CM | POA: Diagnosis not present

## 2024-07-13 DIAGNOSIS — L97812 Non-pressure chronic ulcer of other part of right lower leg with fat layer exposed: Secondary | ICD-10-CM | POA: Diagnosis not present

## 2024-07-13 DIAGNOSIS — I2510 Atherosclerotic heart disease of native coronary artery without angina pectoris: Secondary | ICD-10-CM | POA: Diagnosis not present

## 2024-07-13 DIAGNOSIS — I480 Paroxysmal atrial fibrillation: Secondary | ICD-10-CM | POA: Diagnosis not present

## 2024-07-13 DIAGNOSIS — I890 Lymphedema, not elsewhere classified: Secondary | ICD-10-CM | POA: Diagnosis not present

## 2024-07-13 DIAGNOSIS — E11622 Type 2 diabetes mellitus with other skin ulcer: Secondary | ICD-10-CM | POA: Diagnosis not present

## 2024-07-13 DIAGNOSIS — S81811A Laceration without foreign body, right lower leg, initial encounter: Secondary | ICD-10-CM | POA: Diagnosis not present

## 2024-07-13 DIAGNOSIS — Z87891 Personal history of nicotine dependence: Secondary | ICD-10-CM | POA: Diagnosis not present

## 2024-07-21 DIAGNOSIS — I10 Essential (primary) hypertension: Secondary | ICD-10-CM | POA: Diagnosis not present

## 2024-07-21 DIAGNOSIS — K5792 Diverticulitis of intestine, part unspecified, without perforation or abscess without bleeding: Secondary | ICD-10-CM | POA: Diagnosis not present

## 2024-07-22 ENCOUNTER — Encounter: Attending: Physician Assistant | Admitting: Physician Assistant

## 2024-07-22 DIAGNOSIS — E11622 Type 2 diabetes mellitus with other skin ulcer: Secondary | ICD-10-CM | POA: Insufficient documentation

## 2024-07-22 DIAGNOSIS — I480 Paroxysmal atrial fibrillation: Secondary | ICD-10-CM | POA: Insufficient documentation

## 2024-07-22 DIAGNOSIS — I890 Lymphedema, not elsewhere classified: Secondary | ICD-10-CM | POA: Insufficient documentation

## 2024-07-22 DIAGNOSIS — I10 Essential (primary) hypertension: Secondary | ICD-10-CM | POA: Diagnosis not present

## 2024-07-22 DIAGNOSIS — S81811A Laceration without foreign body, right lower leg, initial encounter: Secondary | ICD-10-CM | POA: Insufficient documentation

## 2024-07-22 DIAGNOSIS — I2510 Atherosclerotic heart disease of native coronary artery without angina pectoris: Secondary | ICD-10-CM | POA: Diagnosis not present

## 2024-07-22 DIAGNOSIS — L97812 Non-pressure chronic ulcer of other part of right lower leg with fat layer exposed: Secondary | ICD-10-CM | POA: Insufficient documentation

## 2024-08-01 ENCOUNTER — Encounter: Admitting: Physician Assistant

## 2024-08-01 DIAGNOSIS — S81811A Laceration without foreign body, right lower leg, initial encounter: Secondary | ICD-10-CM | POA: Diagnosis not present

## 2024-08-01 DIAGNOSIS — I10 Essential (primary) hypertension: Secondary | ICD-10-CM | POA: Diagnosis not present

## 2024-08-01 DIAGNOSIS — I890 Lymphedema, not elsewhere classified: Secondary | ICD-10-CM | POA: Diagnosis not present

## 2024-08-01 DIAGNOSIS — S81811D Laceration without foreign body, right lower leg, subsequent encounter: Secondary | ICD-10-CM | POA: Diagnosis not present

## 2024-08-01 DIAGNOSIS — I2510 Atherosclerotic heart disease of native coronary artery without angina pectoris: Secondary | ICD-10-CM | POA: Diagnosis not present

## 2024-08-01 DIAGNOSIS — E11622 Type 2 diabetes mellitus with other skin ulcer: Secondary | ICD-10-CM | POA: Diagnosis not present

## 2024-08-01 DIAGNOSIS — I480 Paroxysmal atrial fibrillation: Secondary | ICD-10-CM | POA: Diagnosis not present

## 2024-08-01 DIAGNOSIS — L97812 Non-pressure chronic ulcer of other part of right lower leg with fat layer exposed: Secondary | ICD-10-CM | POA: Diagnosis not present

## 2024-08-01 DIAGNOSIS — R319 Hematuria, unspecified: Secondary | ICD-10-CM | POA: Diagnosis not present

## 2024-08-10 ENCOUNTER — Emergency Department

## 2024-08-10 ENCOUNTER — Other Ambulatory Visit: Payer: Self-pay

## 2024-08-10 ENCOUNTER — Observation Stay
Admission: EM | Admit: 2024-08-10 | Discharge: 2024-08-11 | Disposition: A | Discharge status: Home / Self Care | Attending: Hospitalist | Admitting: Hospitalist

## 2024-08-10 ENCOUNTER — Encounter: Payer: Self-pay | Admitting: Emergency Medicine

## 2024-08-10 DIAGNOSIS — Z85828 Personal history of other malignant neoplasm of skin: Secondary | ICD-10-CM | POA: Diagnosis not present

## 2024-08-10 DIAGNOSIS — Z87891 Personal history of nicotine dependence: Secondary | ICD-10-CM | POA: Insufficient documentation

## 2024-08-10 DIAGNOSIS — R519 Headache, unspecified: Secondary | ICD-10-CM | POA: Diagnosis not present

## 2024-08-10 DIAGNOSIS — R079 Chest pain, unspecified: Secondary | ICD-10-CM | POA: Diagnosis present

## 2024-08-10 DIAGNOSIS — E119 Type 2 diabetes mellitus without complications: Secondary | ICD-10-CM | POA: Insufficient documentation

## 2024-08-10 DIAGNOSIS — I160 Hypertensive urgency: Secondary | ICD-10-CM

## 2024-08-10 DIAGNOSIS — E039 Hypothyroidism, unspecified: Secondary | ICD-10-CM | POA: Insufficient documentation

## 2024-08-10 DIAGNOSIS — I2510 Atherosclerotic heart disease of native coronary artery without angina pectoris: Secondary | ICD-10-CM | POA: Insufficient documentation

## 2024-08-10 DIAGNOSIS — Z8673 Personal history of transient ischemic attack (TIA), and cerebral infarction without residual deficits: Secondary | ICD-10-CM | POA: Diagnosis not present

## 2024-08-10 DIAGNOSIS — Z955 Presence of coronary angioplasty implant and graft: Secondary | ICD-10-CM | POA: Insufficient documentation

## 2024-08-10 DIAGNOSIS — M79601 Pain in right arm: Secondary | ICD-10-CM | POA: Insufficient documentation

## 2024-08-10 DIAGNOSIS — F0390 Unspecified dementia without behavioral disturbance: Secondary | ICD-10-CM | POA: Diagnosis not present

## 2024-08-10 DIAGNOSIS — I10 Essential (primary) hypertension: Principal | ICD-10-CM | POA: Insufficient documentation

## 2024-08-10 DIAGNOSIS — J45909 Unspecified asthma, uncomplicated: Secondary | ICD-10-CM | POA: Insufficient documentation

## 2024-08-10 LAB — COMPREHENSIVE METABOLIC PANEL WITH GFR
ALT: 12 U/L (ref 0–44)
AST: 15 U/L (ref 15–41)
Albumin: 4 g/dL (ref 3.5–5.0)
Alkaline Phosphatase: 66 U/L (ref 38–126)
Anion gap: 10 (ref 5–15)
BUN: 14 mg/dL (ref 8–23)
CO2: 22 mmol/L (ref 22–32)
Calcium: 9.2 mg/dL (ref 8.9–10.3)
Chloride: 104 mmol/L (ref 98–111)
Creatinine, Ser: 1.14 mg/dL — ABNORMAL HIGH (ref 0.44–1.00)
GFR, Estimated: 49 mL/min — ABNORMAL LOW (ref >60)
Glucose, Bld: 115 mg/dL — ABNORMAL HIGH (ref 70–99)
Potassium: 4.1 mmol/L (ref 3.5–5.1)
Sodium: 136 mmol/L (ref 135–145)
Total Bilirubin: 0.5 mg/dL (ref 0.0–1.2)
Total Protein: 7.5 g/dL (ref 6.5–8.1)

## 2024-08-10 LAB — CBC
HCT: 38.2 % (ref 36.0–46.0)
Hemoglobin: 12.3 g/dL (ref 12.0–15.0)
MCH: 27.7 pg (ref 26.0–34.0)
MCHC: 32.2 g/dL (ref 30.0–36.0)
MCV: 86 fL (ref 80.0–100.0)
Platelets: 236 K/uL (ref 150–400)
RBC: 4.44 MIL/uL (ref 3.87–5.11)
RDW: 13.1 % (ref 11.5–15.5)
WBC: 9.8 K/uL (ref 4.0–10.5)
nRBC: 0 % (ref 0.0–0.2)

## 2024-08-10 LAB — URINALYSIS, ROUTINE W REFLEX MICROSCOPIC
Bilirubin Urine: NEGATIVE
Glucose, UA: NEGATIVE mg/dL
Hgb urine dipstick: NEGATIVE
Ketones, ur: NEGATIVE mg/dL
Leukocytes,Ua: NEGATIVE
Nitrite: NEGATIVE
Protein, ur: NEGATIVE mg/dL
Specific Gravity, Urine: 1.005 (ref 1.005–1.030)
pH: 7 (ref 5.0–8.0)

## 2024-08-10 LAB — CK: Total CK: 77 U/L (ref 38–234)

## 2024-08-10 LAB — TROPONIN I (HIGH SENSITIVITY)
Troponin I (High Sensitivity): 4 ng/L (ref <18)
Troponin I (High Sensitivity): 4 ng/L (ref <18)

## 2024-08-10 MED ORDER — LABETALOL HCL 5 MG/ML IV SOLN
10.0000 mg | Freq: Once | INTRAVENOUS | Status: AC
  Administered 2024-08-10: 10 mg via INTRAVENOUS
  Filled 2024-08-10: qty 4

## 2024-08-10 MED ORDER — HYDRALAZINE HCL 20 MG/ML IJ SOLN
5.0000 mg | Freq: Once | INTRAMUSCULAR | Status: AC
  Administered 2024-08-10: 5 mg via INTRAVENOUS
  Filled 2024-08-10: qty 1

## 2024-08-10 NOTE — ED Provider Notes (Signed)
 [facility] Provider Note    Event Date/Time   First MD Initiated Contact with Patient 08/10/24 1932     (approximate)   History   Headache   HPI  Cathy Hines is a 78 y.o. female with a history of type 2 diabetes, hypertension, hyperlipidemia, mitral prolapse, and TIA who presents with multiple complaints.  The patient states that initially she started having pain to her right neck and upper arm about 2 days ago.  Separately she started having headache which she describes as relatively mild and intermittent.  She is not having headache currently.  She also developed pain in her left leg.  She states that she had some discomfort in her chest but is not having chest pain currently.  She denies any shortness of breath.  However, her blood pressure was significantly elevated.  She has had some mild blurred vision.  She reports being compliant with all of her blood pressure medications.  I reviewed the past medical records.  The patient was seen by Dr. Cleotilde from internal medicine on 9/4 for follow-up of her chronic conditions as well as some abdominal pain at that time.   Physical Exam   Triage Vital Signs: ED Triage Vitals  Encounter Vitals Group     BP 08/10/24 1817 (!) 216/79     Girls Systolic BP Percentile --      Girls Diastolic BP Percentile --      Boys Systolic BP Percentile --      Boys Diastolic BP Percentile --      Pulse Rate 08/10/24 1817 84     Resp 08/10/24 1817 17     Temp 08/10/24 1817 98.5 F (36.9 C)     Temp Source 08/10/24 1817 Oral     SpO2 08/10/24 1817 97 %     Weight 08/10/24 1814 137 lb (62.1 kg)     Height 08/10/24 1814 4' 10 (1.473 m)     Head Circumference --      Peak Flow --      Pain Score 08/10/24 1814 10     Pain Loc --      Pain Education --      Exclude from Growth Chart --     Most recent vital signs: Vitals:   08/10/24 2119 08/10/24 2303  BP:  (!) 172/66  Pulse: 64 80  Resp: 11 20  Temp:    SpO2:  100% 100%     General: Alert, relatively well-appearing, no distress.  CV:  Good peripheral perfusion.  Resp:  Normal effort.  Lungs CTAB. Abd:  No distention.  Other:  EOMI.  PERRLA.  No photophobia.  No facial droop.  Normal speech.  Motor intact in all extremities.  No ataxia.   ED Results / Procedures / Treatments   Labs (all labs ordered are listed, but only abnormal results are displayed) Labs Reviewed  COMPREHENSIVE METABOLIC PANEL WITH GFR - Abnormal; Notable for the following components:      Result Value   Glucose, Bld 115 (*)    Creatinine, Ser 1.14 (*)    GFR, Estimated 49 (*)    All other components within normal limits  URINALYSIS, ROUTINE W REFLEX MICROSCOPIC - Abnormal; Notable for the following components:   Color, Urine COLORLESS (*)    APPearance CLEAR (*)    All other components within normal limits  CBC  CK  TROPONIN I (HIGH SENSITIVITY)  TROPONIN I (HIGH SENSITIVITY)  EKG  ED ECG REPORT I, Waylon Cassis, the attending physician, personally viewed and interpreted this ECG.  Date: 08/10/2024 EKG Time: 1820 Rate: 82 Rhythm: normal sinus rhythm QRS Axis: normal Intervals: normal ST/T Wave abnormalities: Nonspecific ST abnormalities Narrative Interpretation: no evidence of acute ischemia    RADIOLOGY  CT head: I independently viewed and interpreted the images; there is no ICH.  Radiology report indicates no acute abnormality.  PROCEDURES:  Critical Care performed: No  Procedures   MEDICATIONS ORDERED IN ED: Medications  hydrALAZINE  (APRESOLINE ) injection 5 mg (5 mg Intravenous Given 08/10/24 2021)  labetalol  (NORMODYNE ) injection 10 mg (10 mg Intravenous Given 08/10/24 2259)     IMPRESSION / MDM / ASSESSMENT AND PLAN / ED COURSE  I reviewed the triage vital signs and the nursing notes.  78 year old female with PMH as noted above presents with intermittent headache, right arm and left leg pain, and elevated blood pressure.   On exam the patient is well-appearing.  Her blood pressure is elevated but other vital signs are normal.  Neurologic exam is nonfocal.  Physical exam is otherwise unremarkable for acute findings.  Differential diagnosis includes, but is not limited to, musculoskeletal pain, hypertensive urgency or emergency, ICH, tension headache, migraine, muscle strain or spasm, electrolyte abnormality, other metabolic etiology.  We will obtain lab workup, CT head, give IV hydralazine  for blood pressure control, and reassess.  Patient's presentation is most consistent with acute presentation with potential threat to life or bodily function.  The patient is on the cardiac monitor to evaluate for evidence of arrhythmia and/or significant heart rate changes   ----------------------------------------- 11:57 PM on 08/10/2024 -----------------------------------------  CT head is negative.  CMP and CBC showed no acute findings.  Troponin is negative.  The blood pressure did not really respond to hydralazine , but is now improved after labetalol .  The patient will need admission for further management.  I consulted Dr. Cleatus from the hospitalist service; based on our discussion she agrees to evaluate the patient for admission.   FINAL CLINICAL IMPRESSION(S) / ED DIAGNOSES   Final diagnoses:  Hypertension, unspecified type  Nonintractable episodic headache, unspecified headache type     Rx / DC Orders   ED Discharge Orders     None        Note:  This document was prepared using Dragon voice recognition software and may include unintentional dictation errors.    Cassis Waylon, MD 08/10/24 2358

## 2024-08-10 NOTE — ED Triage Notes (Signed)
 Patient to ED from [facility] for headache, HTN, left leg and right arm pain. KC reported that pt had CP- pt denies any CP in triage. States blurry vision for the past 2-3 days. States she has been taking her BP meds regularly.

## 2024-08-11 ENCOUNTER — Other Ambulatory Visit: Payer: Self-pay

## 2024-08-11 DIAGNOSIS — R519 Headache, unspecified: Secondary | ICD-10-CM | POA: Insufficient documentation

## 2024-08-11 DIAGNOSIS — I160 Hypertensive urgency: Secondary | ICD-10-CM | POA: Diagnosis not present

## 2024-08-11 DIAGNOSIS — R079 Chest pain, unspecified: Secondary | ICD-10-CM | POA: Diagnosis present

## 2024-08-11 DIAGNOSIS — M79601 Pain in right arm: Secondary | ICD-10-CM | POA: Insufficient documentation

## 2024-08-11 LAB — TROPONIN I (HIGH SENSITIVITY): Troponin I (High Sensitivity): 3 ng/L (ref <18)

## 2024-08-11 MED ORDER — LOSARTAN POTASSIUM 100 MG PO TABS
100.0000 mg | ORAL_TABLET | Freq: Every day | ORAL | 0 refills | Status: AC
  Filled 2024-08-11 (×2): qty 7, 7d supply, fill #0

## 2024-08-11 MED ORDER — METOPROLOL SUCCINATE ER 50 MG PO TB24
50.0000 mg | ORAL_TABLET | Freq: Every day | ORAL | Status: DC
  Administered 2024-08-11: 50 mg via ORAL
  Filled 2024-08-11: qty 1

## 2024-08-11 MED ORDER — LOSARTAN POTASSIUM 50 MG PO TABS
50.0000 mg | ORAL_TABLET | Freq: Every day | ORAL | Status: DC
  Administered 2024-08-11: 50 mg via ORAL
  Filled 2024-08-11: qty 1

## 2024-08-11 MED ORDER — LEVOTHYROXINE SODIUM 50 MCG PO TABS: 75.0000 ug | ORAL_TABLET | Freq: Every day | ORAL | Status: DC

## 2024-08-11 MED ORDER — ACETAMINOPHEN 500 MG PO TABS: 1000.0000 mg | ORAL_TABLET | Freq: Three times a day (TID) | ORAL | Status: DC | PRN

## 2024-08-11 MED ORDER — AMLODIPINE BESYLATE 5 MG PO TABS
10.0000 mg | ORAL_TABLET | Freq: Every day | ORAL | Status: DC
  Administered 2024-08-11: 10 mg via ORAL
  Filled 2024-08-11: qty 2

## 2024-08-11 MED ORDER — AMLODIPINE BESYLATE 10 MG PO TABS
10.0000 mg | ORAL_TABLET | Freq: Every day | ORAL | 0 refills | Status: AC
  Filled 2024-08-11 (×2): qty 7, 7d supply, fill #0

## 2024-08-11 MED ORDER — ONDANSETRON HCL 4 MG/2ML IJ SOLN: 4.0000 mg | Freq: Four times a day (QID) | INTRAMUSCULAR | Status: DC | PRN

## 2024-08-11 MED ORDER — ALPRAZOLAM 0.25 MG PO TABS: 0.2500 mg | ORAL_TABLET | Freq: Every evening | ORAL | Status: DC | PRN

## 2024-08-11 MED ORDER — HYDRALAZINE HCL 20 MG/ML IJ SOLN: 10.0000 mg | Freq: Four times a day (QID) | INTRAMUSCULAR | Status: DC | PRN

## 2024-08-11 MED ORDER — ENOXAPARIN SODIUM 40 MG/0.4ML IJ SOSY: 40.0000 mg | PREFILLED_SYRINGE | INTRAMUSCULAR | Status: DC

## 2024-08-11 MED ORDER — AMLODIPINE BESYLATE 10 MG PO TABS: 10.0000 mg | ORAL_TABLET | Freq: Every day | ORAL | 2 refills | Status: AC

## 2024-08-11 MED ORDER — LOSARTAN POTASSIUM 100 MG PO TABS: 100.0000 mg | ORAL_TABLET | Freq: Every day | ORAL | 2 refills | Status: AC

## 2024-08-11 NOTE — ED Notes (Signed)
 Pt assisted to the toilet, pt ambulatory without difficulty

## 2024-08-11 NOTE — Discharge Summary (Signed)
 Physician Discharge Summary   Cathy Hines  female DOB: [birth year]  FMW:[id number]  PCP: Cleotilde Oneil FALCON, MD  Admit date: 08/10/2024 Discharge date: 08/11/2024  Admitted From: home Disposition:  home Husband updated at bedside prior to discharge. CODE STATUS: Full code  Discharge Instructions     Discharge instructions   Complete by: As directed    Your amlodipine  is increased from 5 mg to 10 mg daily.  Your losartan  is increased from 50 mg to 100 mg daily.  Continue furosemide  and metoprolol  at your home dose. [facility] Course:  For full details, please see H&P, progress notes, consult notes and ancillary notes.  Briefly,  Cathy Hines is a 78 y.o. female with past medical history of hypertension, dementia, hypothyroidism, CAD who presented to the ED with pain in the right shoulder and headache.     Workup in the ED was reassuring except for an elevated blood pressure with systolic in the 200s.  After pt's home BP meds were ordered at higher doses and given, pt's BP decreased to 140's-150's.  All symptoms resolved.  Pt was at baseline with ambulation.  Both pt and husband requested for discharge home.  Hypertensive urgency --resume home amlodipine  at increased 10 mg daily (up from 5). --resume home losartan  at increased 100 mg daily (up from 50) --cont home Toprol  50 mg daily --resume home lasix  20 mg daily after discharge.   Headache Patient denied headache at the time of admission.   Right arm pain --resolved at the time of admission. --trop neg x3.  Pt denied chest pain.   Unless noted above, medications under STOP list are ones pt was not taking PTA.  Discharge Diagnoses:  Principal Problem:   Chest pain Active Problems:   Hypertensive urgency--resolved   Right arm pain   Headache     Discharge Instructions:  Allergies as of 08/11/2024       Reactions   Ace Inhibitors Cough   Amoxicillin     Pt doesn't remember   Aspirin Other  (See Comments)   Per patient, GI bleed.   Flagyl [metronidazole] Other (See Comments)   Pt cannot remember   Gabapentin    headache   Kiwi Extract Other (See Comments)   Lips swell, mouth goes numb   Memantine Other (See Comments)   Milk-related Compounds Nausea And Vomiting   Namenda [memantine Hcl]    Pt doesn't remember   Statins    Pt doesn't remember   Sulfa Antibiotics Swelling   Verapamil    Pt doesn't remember        Medication List     STOP taking these medications    acyclovir  200 MG capsule Commonly known as: ZOVIRAX    albuterol  108 (90 Base) MCG/ACT inhaler Commonly known as: VENTOLIN  HFA   clotrimazole-betamethasone cream Commonly known as: LOTRISONE   doxycycline 100 MG tablet Commonly known as: VIBRA-TABS   folic acid 1 MG tablet Commonly known as: FOLVITE   Magnesium 500 MG Tabs   omeprazole 40 MG capsule Commonly known as: PRILOSEC   polyethylene glycol 17 g packet Commonly known as: MIRALAX  / GLYCOLAX        TAKE these medications    ALPRAZolam  0.25 MG tablet Commonly known as: XANAX  Take 0.25 mg by mouth at bedtime.   amLODipine  10 MG tablet Commonly known as: NORVASC  Take 1 tablet (10 mg total) by mouth daily. Increased from 5 mg. What changed:  medication strength how  much to take additional instructions   Cholecalciferol 25 MCG (1000 UT) tablet Take by mouth.   docusate sodium  100 MG capsule Commonly known as: Colace Take 1 capsule (100 mg total) by mouth daily as needed for mild constipation.   furosemide  20 MG tablet Commonly known as: LASIX  Take 20 mg by mouth daily.   levothyroxine  25 MCG tablet Commonly known as: SYNTHROID  Take 3 tablets (75 mcg total) by mouth daily before breakfast.   losartan  100 MG tablet Commonly known as: COZAAR  Take 1 tablet (100 mg total) by mouth daily. Increased from 50 mg. What changed:  medication strength how much to take additional instructions   metoprolol  succinate 50 MG  24 hr tablet Commonly known as: Toprol  XL Take 1 tablet (50 mg total) by mouth daily. Take with or immediately following a meal.   pramipexole  0.125 MG tablet Commonly known as: MIRAPEX  Take 0.125 mg by mouth 3 (three) times daily.   Vitamin B12 1000 MCG Tbcr Take 1 tablet by mouth daily.         Follow-up Information     Cleotilde Oneil FALCON, MD Follow up in 1 week(s).   Specialty: Internal Medicine Contact information: [phone] [facility] [address] [place] [address] [phone]                 Allergies  Allergen Reactions   Ace Inhibitors Cough   Amoxicillin      Pt doesn't remember   Aspirin Other (See Comments)    Per patient, GI bleed.   Flagyl [Metronidazole] Other (See Comments)    Pt cannot remember   Gabapentin     headache   Kiwi Extract Other (See Comments)    Lips swell, mouth goes numb   Memantine Other (See Comments)   Milk-Related Compounds Nausea And Vomiting   Namenda [Memantine Hcl]     Pt doesn't remember   Statins     Pt doesn't remember   Sulfa Antibiotics Swelling   Verapamil     Pt doesn't remember     The results of significant diagnostics from this hospitalization (including imaging, microbiology, ancillary and laboratory) are listed below for reference.   Consultations:   Procedures/Studies: CT Head Wo Contrast Result Date: 08/10/2024 EXAM: CT HEAD WITHOUT CONTRAST 08/10/2024 08:35:36 PM TECHNIQUE: CT of the head was performed without the administration of intravenous contrast. Automated exposure control, iterative reconstruction, and/or weight based adjustment of the mA/kV was utilized to reduce the radiation dose to as low as reasonably achievable. COMPARISON: None available. CLINICAL HISTORY: Headache, new onset (Age >= 51y). Patient to ED from [facility] for headache, HTN, left leg and right arm pain. KC reported that pt had CP- pt denies any CP in triage. States blurry vision for the past 2-3 days.  States she has been taking her BP meds regularly. FINDINGS: BRAIN AND VENTRICLES: Mild age-related atrophy. No acute hemorrhage. No evidence of acute infarct. No hydrocephalus. No extra-axial collection. No mass effect or midline shift. ORBITS: No acute abnormality. SINUSES: No acute abnormality. SOFT TISSUES AND SKULL: No acute soft tissue abnormality. No skull fracture. IMPRESSION: 1. No acute intracranial abnormality. Electronically signed by: Pinkie Pebbles MD 08/10/2024 08:41 PM EDT RP Workstation: [id number]      Labs: BNP (last 3 results) Recent Labs    11/06/23 1242  BNP 561.1*   Basic Metabolic Panel: Recent Labs  Lab 08/10/24 1818  NA 136  K 4.1  CL 104  CO2 22  GLUCOSE 115*  BUN 14  CREATININE 1.14*  CALCIUM 9.2   Liver Function Tests: Recent Labs  Lab 08/10/24 1818  AST 15  ALT 12  ALKPHOS 66  BILITOT 0.5  PROT 7.5  ALBUMIN 4.0   No results for input(s): LIPASE, AMYLASE in the last 168 hours. No results for input(s): AMMONIA in the last 168 hours. CBC: Recent Labs  Lab 08/10/24 1818  WBC 9.8  HGB 12.3  HCT 38.2  MCV 86.0  PLT 236   Cardiac Enzymes: Recent Labs  Lab 08/10/24 2011  CKTOTAL 77   BNP: Invalid input(s): POCBNP CBG: No results for input(s): GLUCAP in the last 168 hours. D-Dimer No results for input(s): DDIMER in the last 72 hours. Hgb A1c No results for input(s): HGBA1C in the last 72 hours. Lipid Profile No results for input(s): CHOL, HDL, LDLCALC, TRIG, CHOLHDL, LDLDIRECT in the last 72 hours. Thyroid  function studies No results for input(s): TSH, T4TOTAL, T3FREE, THYROIDAB in the last 72 hours.  Invalid input(s): FREET3 Anemia work up No results for input(s): VITAMINB12, FOLATE, FERRITIN, TIBC, IRON, RETICCTPCT in the last 72 hours. Urinalysis    Component Value Date/Time   COLORURINE COLORLESS (A) 08/10/2024 2011   APPEARANCEUR CLEAR (A) 08/10/2024 2011   LABSPEC  1.005 08/10/2024 2011   PHURINE 7.0 08/10/2024 2011   GLUCOSEU NEGATIVE 08/10/2024 2011   HGBUR NEGATIVE 08/10/2024 2011   BILIRUBINUR NEGATIVE 08/10/2024 2011   KETONESUR NEGATIVE 08/10/2024 2011   PROTEINUR NEGATIVE 08/10/2024 2011   NITRITE NEGATIVE 08/10/2024 2011   LEUKOCYTESUR NEGATIVE 08/10/2024 2011   Sepsis Labs Recent Labs  Lab 08/10/24 1818  WBC 9.8   Microbiology No results found for this or any previous visit (from the past 240 hours).   Total time spend on discharging this patient, including the last patient exam, discussing the hospital stay, instructions for ongoing care as it relates to all pertinent caregivers, as well as preparing the medical discharge records, prescriptions, and/or referrals as applicable, is 25 minutes.    Ellouise Haber, MD  [facility] 08/11/2024, 12:46 PM

## 2024-08-11 NOTE — Progress Notes (Addendum)
 Admission requested on this patient for hypertensive crisis however, by the time of review for admission, BP had already improved with treatment administered in the ED . She presented with elevated BP  blurred vision, R arm/shoulder pain, some leg pain, chest pain that resolved, and intermittent headache. CT head negative. Labs unremarkable. BP initially around 200s/80s treated with hydralazine , and labetalol   Vitals 08/11/24 0315 98 F (36.7 C) 75 76 21 Abnormal  136/51 Abnormal  96 %  08/11/24 0100 -- 69 72 15 147/79 Abnormal  98 %  08/11/24 0050 -- 71 72 20 -- 94 %  08/11/24 0045 -- 72 75 27 Abnormal  136/64 95 %  08/11/24 0030 -- 69 72 15 167/64 Abnormal  94 %  08/11/24 0020 -- 78 78 16 -- 96 %  08/11/24 0015 -- 75 78 26 Abnormal  186/51 Abnormal  100 %  08/11/24 0000 97.9 F (36.6 C) 78 81 22 Abnormal  189/68 Abnormal  97 %  08/10/24 2303 -- 80 78 20 172/66 Abnormal  100 %  08/10/24 2119 -- 64 65 11 -- 100 %  08/10/24 2100 -- 69 70 15 202/72 Abnormal  100 %  08/10/24 1957 -- 67 69 15 -- 100 %  08/10/24 1940 98.2 F (36.8 C) 74 72 19 194/80 Abnormal  100 %  08/10/24 1817 98.5 F (36.9 C) 84 -- 17 216/79 Abnormal     Labs and imaging reviewed  Assessment: Hypertensive Urgency --resolved  Recommendation:  -BP has improved with management in the ED -Patient is no longer meeting criteria for admission for BP control -Patient can  safely discharged from the ED with close follow up with PCP -Please reconsult if continuing concerns

## 2024-08-11 NOTE — Assessment & Plan Note (Signed)
 Patient reported painful shoulder with pain on range of motion States pain has resolved

## 2024-08-11 NOTE — Assessment & Plan Note (Addendum)
-  BP has improved with management in the ED -Patient is no longer meeting criteria for admission for BP control -Patient can  safely discharged from the ED with close follow up with PCP -Patient follows with Dr. Cleotilde -Recommend continuing home metoprolol

## 2024-08-11 NOTE — ED Notes (Signed)
 Pt requesting a cup of coffee, coffee given to pt with cream and sugar, pt assisted to sit up in bed

## 2024-08-11 NOTE — Assessment & Plan Note (Signed)
 Patient denies headache at the time of assessment

## 2024-08-11 NOTE — H&P (Signed)
 History and Physical    Cathy Hines FMW:[id number] DOB: [birth year] DOA: 08/10/2024  PCP: Cleotilde Oneil FALCON, MD  Patient coming from: home  I have personally briefly reviewed patient's old medical records in [facility]  Chief Complaint: headache and left arm pain.  HPI:  Cathy Hines is a 78 y.o. female with past medical history of hypertension, dementia, hypothyroidism, CAD who presented to the ED with pain in the right shoulder and headache.    Workup in the ED was reassuring except for an elevated blood pressure with systolic in the 200s.  She was treated with IV labetalol  and hydralazine  with normalization of blood pressure.  Hospitalist service was initially consulted and recommendation was for discharge from ED.  However, pt's BP trended up again (pt was not ordered any of her home oral BP meds at this time), so ED provider requested admission again.  Hypertensive urgency --resume home amlodipine  at increased 10 mg daily --resume home losartan  at increased 100 mg daily --cont Toprol   Headache Patient denied headache at the time of admission.   Right arm pain --resolved at the time of admission. --trop neg x3.  Pt denied chest pain.   DVT prophylaxis: Lovenox  SQ Code Status: Full code  Family Communication: husband updated at bedside on admission  Disposition Plan: home  Consults called: none Level of care: Med-Surg   Review of Systems: As per HPI otherwise complete review of systems negative.   Past Medical History:  Diagnosis Date   Arthritis    osteo   Asthma    wheezing   Bell's palsy    H/O   Cancer [facility])    skin cancer on face and right arm- 2013   Cellulitis    left shin   Complication of anesthesia    woke up during one surgery   Coronary artery disease    stent   Cough    chronic   CVA (cerebral vascular accident) (HCC)    Dementia (HCC)    very mild   Depression    distant H/O   Diabetes mellitus without complication (HCC)    type  2   Diverticulitis    OSIS   Gastritis    GERD (gastroesophageal reflux disease)    History of hiatal hernia    Hypercholesterolemia    Hypertension    on HTN meds   Hypothyroidism    Liver cyst    Memory loss    MVP (mitral valve prolapse)    Pneumonia    in past   Renal insufficiency    horseshoe kidney   Sleep apnea    no CPAP   Vertigo    dizziness   Wears dentures    full upper and lower    Past Surgical History:  Procedure Laterality Date   ABDOMINAL HYSTERECTOMY     APPENDECTOMY     BROW LIFT Bilateral 06/22/2018   Procedure: BLEPHAROPLASTY UPPER EYELID WITH EXCESS SKIN DIABETIC;  Surgeon: Ashley Greig HERO, MD;  Location: [facility];  Service: Ophthalmology;  Laterality: Bilateral;   CARPAL TUNNEL RELEASE     CHOLECYSTECTOMY     COLONOSCOPY     HEMORRHOID SURGERY     LIVER SURGERY     excision liver cyst   PTOSIS REPAIR Bilateral 06/22/2018   Procedure: BROW PTOSIS REPAIR;  Surgeon: Ashley Greig HERO, MD;  Location: [facility];  Service: Ophthalmology;  Laterality: Bilateral;  Diabetes - oral meds     reports that  she quit smoking about 42 years ago. She has never used smokeless tobacco. She reports that she does not drink alcohol. No history on file for drug use.  Allergies  Allergen Reactions   Ace Inhibitors Cough   Amoxicillin      Pt doesn't remember   Aspirin Other (See Comments)    Per patient, GI bleed.   Flagyl [Metronidazole] Other (See Comments)    Pt cannot remember   Gabapentin     headache   Kiwi Extract Other (See Comments)    Lips swell, mouth goes numb   Memantine Other (See Comments)   Milk-Related Compounds Nausea And Vomiting   Namenda [Memantine Hcl]     Pt doesn't remember   Statins     Pt doesn't remember   Sulfa Antibiotics Swelling   Verapamil     Pt doesn't remember    Family History  Problem Relation Age of Onset   Breast cancer Mother 11   Breast cancer Sister 67    Prior to Admission medications    Medication Sig Start Date End Date Taking? Authorizing Provider  ALPRAZolam  (XANAX ) 0.25 MG tablet Take 0.25 mg by mouth at bedtime. 07/11/24 10/09/24 Yes [provider]  furosemide  (LASIX ) 20 MG tablet Take 20 mg by mouth daily.   Yes [provider]  metoprolol  succinate (TOPROL  XL) 50 MG 24 hr tablet Take 1 tablet (50 mg total) by mouth daily. Take with or immediately following a meal. 11/09/23 11/08/24 Yes Cyrena Mylar, MD  pramipexole  (MIRAPEX ) 0.125 MG tablet Take 0.125 mg by mouth 3 (three) times daily.   Yes [provider]  amLODipine  (NORVASC ) 10 MG tablet Take 1 tablet (10 mg total) by mouth daily. Increased from 5 mg. 08/11/24   Awanda City, MD  amLODipine  (NORVASC ) 10 MG tablet Take 1 tablet (10 mg total) by mouth daily. Increased from 5 mg. 08/11/24   Awanda City, MD  Cholecalciferol 25 MCG (1000 UT) tablet Take by mouth.    [provider]  Cyanocobalamin  (VITAMIN B12) 1000 MCG TBCR Take 1 tablet by mouth daily.     [provider]  docusate sodium  (COLACE) 100 MG capsule Take 1 capsule (100 mg total) by mouth daily as needed for mild constipation. 01/08/19   Tobie Press, MD  levothyroxine  (SYNTHROID ) 25 MCG tablet Take 3 tablets (75 mcg total) by mouth daily before breakfast. 11/09/23 12/09/23  Cyrena Mylar, MD  losartan  (COZAAR ) 100 MG tablet Take 1 tablet (100 mg total) by mouth daily. Increased from 50 mg. 08/11/24   Awanda City, MD  losartan  (COZAAR ) 100 MG tablet Take 1 tablet (100 mg total) by mouth daily. Increased from 50 mg. 08/11/24   Awanda City, MD    Physical Exam: Vitals:   08/11/24 0853 08/11/24 0927 08/11/24 1130 08/11/24 1139  BP: (!) 179/62 (!) 179/62 (!) 149/51   Pulse:  71 72   Resp:   20   Temp:    97.8 F (36.6 C)  TempSrc:    Oral  SpO2:   100%   Weight:      Height:        Constitutional: NAD, AAOx3 HEENT: conjunctivae and lids normal, EOMI CV: No cyanosis.   RESP: normal respiratory effort, on RA Extremities:  No effusions, edema in BLE SKIN: warm, dry Neuro: II - XII grossly intact.   Psych: Normal mood and affect.  Appropriate judgement and reason  Labs on Admission: I have personally reviewed labs and imaging studies  Time  spent: 50 minutes  Ellouise Haber MD [facility]  If 7PM-7AM, please contact night-coverage 08/11/2024, 1:28 PM

## 2024-08-11 NOTE — ED Notes (Signed)
 Patient's BP has been 136, 147, 136, 136, 155 systolic respectively while patient has been asleep the last several hours, When patient ambulates and is awake, her BP has been 170 and 182 systolic respectively. Patient states that she just feels tired and just doesn't feel good. MD made aware of BP fluctuations and how patient is feeling.

## 2024-08-11 NOTE — ED Provider Notes (Signed)
 Clinical Course as of 08/11/24 9386  Thu Aug 11, 2024  9467 Patient has waited overnight for admission.  I was asked by hospitalist to get involved as on review, patient did not likely meet admission criteria.  I reassessed patient she is alert and oriented x 3.  Reports continued pain in her arm and lower extremity but denies any weakness and neurovascular exam is completely benign.  She denies any vision changes and reports feeling much improved after her blood pressure was lowered.  Will attempt to ambulate the patient and see how she does [HD]  9387 Patient ambulated but afterwards felt poorly with worsening chest pain, worsening arm pain and blurry vision again.  Patient's blood pressure returned to elevated and patient and nursing at this time do not feel comfortable with patient returning home.  Case to be discussed with hospitalist for admission. Repeat Consult placed.  [HD]    Clinical Course User Index [HD] Nicholaus Rolland BRAVO, MD      Nicholaus Rolland BRAVO, MD 08/11/24 [phone]

## 2024-08-11 NOTE — ED Notes (Signed)
 Evaluated patient ambulation-patient ambulates without difficulty and with a steady gate. Patient not C/O any pain or SOB at this time.

## 2024-08-11 NOTE — Consult Note (Signed)
 Initial Consultation Note   Patient: Cathy Hines FMW:[id number] DOB: 31-Aug-1946 PCP: Cleotilde Oneil FALCON, MD DOA: 08/10/2024 DOS: the patient was seen and examined on 08/11/2024 Primary service: Nicholaus Rolland BRAVO, MD  Referring physician: Dr Jacolyn Reason for consult: Hypertensive Crisis  Assessment/Plan: Assessment and Plan: Hypertensive urgency--resolved -BP has improved with management in the ED -Patient is no longer meeting criteria for admission for BP control -Patient can  safely discharged from the ED with close follow up with PCP -Patient follows with Dr. Cleotilde -Recommend continuing home metoprolol    Headache Patient denies headache at the time of assessment  Right arm pain Patient reported painful shoulder with pain on range of motion States pain has resolved       TRH will sign off at present, please call us  again when needed.  HPI: Cathy Hines is a 78 y.o. female with past medical history of Patient with a history of hypertension, dementia, hypothyroidism, CAD who presented to the ED with pain in the right shoulder  and headache.  Workup in the ED was reassuring except for an elevated blood pressure with systolic in the 200s.  She was treated with labetalol  and hydralazine  with normalization of blood pressure.  Hospitalist service was consulted for admission.  By the time of assessment of patient which was delayed, blood pressure had normalized and patient reported resolution and arm pain as well as headache.. Review of Systems  Constitutional:  Negative for chills and fever.  Respiratory:  Negative for cough and shortness of breath.   Cardiovascular:  Negative for chest pain, palpitations, orthopnea and leg swelling.  Gastrointestinal:  Negative for heartburn.  Neurological:  Negative for dizziness and focal weakness.     Past Medical History:  Diagnosis Date   Arthritis    osteo   Asthma    wheezing   Bell's palsy    H/O   Cancer [facility])    skin  cancer on face and right arm- 2013   Cellulitis    left shin   Complication of anesthesia    woke up during one surgery   Coronary artery disease    stent   Cough    chronic   CVA (cerebral vascular accident) (HCC)    Dementia (HCC)    very mild   Depression    distant H/O   Diabetes mellitus without complication (HCC)    type 2   Diverticulitis    OSIS   Gastritis    GERD (gastroesophageal reflux disease)    History of hiatal hernia    Hypercholesterolemia    Hypertension    on HTN meds   Hypothyroidism    Liver cyst    Memory loss    MVP (mitral valve prolapse)    Pneumonia    in past   Renal insufficiency    horseshoe kidney   Sleep apnea    no CPAP   Vertigo    dizziness   Wears dentures    full upper and lower   Past Surgical History:  Procedure Laterality Date   ABDOMINAL HYSTERECTOMY     APPENDECTOMY     BROW LIFT Bilateral 06/22/2018   Procedure: BLEPHAROPLASTY UPPER EYELID WITH EXCESS SKIN DIABETIC;  Surgeon: Ashley Greig HERO, MD;  Location: [facility];  Service: Ophthalmology;  Laterality: Bilateral;   CARPAL TUNNEL RELEASE     CHOLECYSTECTOMY     COLONOSCOPY     HEMORRHOID SURGERY     LIVER SURGERY  excision liver cyst   PTOSIS REPAIR Bilateral 06/22/2018   Procedure: BROW PTOSIS REPAIR;  Surgeon: Ashley Greig HERO, MD;  Location: [facility];  Service: Ophthalmology;  Laterality: Bilateral;  Diabetes - oral meds   Social History:  reports that she quit smoking about 42 years ago. She has never used smokeless tobacco. She reports that she does not drink alcohol. No history on file for drug use.  Allergies  Allergen Reactions   Ace Inhibitors Cough   Amoxicillin      Pt doesn't remember   Aspirin Other (See Comments)    Per patient, GI bleed.   Flagyl [Metronidazole] Other (See Comments)    Pt cannot remember   Gabapentin     headache   Kiwi Extract Other (See Comments)    Lips swell, mouth goes numb   Memantine Other (See  Comments)   Milk-Related Compounds Nausea And Vomiting   Namenda [Memantine Hcl]     Pt doesn't remember   Statins     Pt doesn't remember   Sulfa Antibiotics Swelling   Verapamil     Pt doesn't remember    Family History  Problem Relation Age of Onset   Breast cancer Mother 71   Breast cancer Sister 17    Prior to Admission medications   Medication Sig Start Date End Date Taking? Authorizing Provider  acyclovir  (ZOVIRAX ) 200 MG capsule Take 200 mg by mouth 5 (five) times daily.    [provider]  albuterol  (PROVENTIL  HFA;VENTOLIN  HFA) 108 (90 Base) MCG/ACT inhaler Inhale into the lungs every 6 (six) hours as needed for wheezing or shortness of breath.    [provider]  Cholecalciferol 25 MCG (1000 UT) tablet Take by mouth.    [provider]  clotrimazole-betamethasone (LOTRISONE) cream  03/26/21   [provider]  Cyanocobalamin  (VITAMIN B12) 1000 MCG TBCR Take 1 tablet by mouth daily.     [provider]  docusate sodium  (COLACE) 100 MG capsule Take 1 capsule (100 mg total) by mouth daily as needed for mild constipation. 01/08/19   Tobie Press, MD  doxycycline (VIBRA-TABS) 100 MG tablet  04/22/21   [provider]  folic acid (FOLVITE) 1 MG tablet Take 1 tablet by mouth daily.     [provider]  levothyroxine  (SYNTHROID ) 25 MCG tablet Take 3 tablets (75 mcg total) by mouth daily before breakfast. 11/09/23 12/09/23  Cyrena Mylar, MD  Magnesium 500 MG TABS Take 1,000 mg by mouth daily.    [provider]  metoprolol  succinate (TOPROL  XL) 50 MG 24 hr tablet Take 1 tablet (50 mg total) by mouth daily. Take with or immediately following a meal. 11/09/23 11/08/24  Cyrena Mylar, MD  omeprazole (PRILOSEC) 40 MG capsule  04/03/21   [provider]  polyethylene glycol (MIRALAX  / GLYCOLAX ) packet Take 17 g by mouth daily. 01/08/19   Tobie Press, MD  pramipexole  (MIRAPEX ) 0.125 MG tablet Take 0.125 mg by  mouth 3 (three) times daily.    [provider]    Physical Exam: Vitals:   08/11/24 0045 08/11/24 0050 08/11/24 0100 08/11/24 0315  BP: 136/64  (!) 147/79 (!) 136/51  Pulse: 72 71 69 75  Resp: (!) 27 20 15  (!) 21  Temp:    98 F (36.7 C)  TempSrc:    Oral  SpO2: 95% 94% 98% 96%  Weight:      Height:       Physical Exam Vitals and nursing note reviewed.  Constitutional:      General: She is not in acute distress. HENT:     Head: Normocephalic and atraumatic.  Cardiovascular:     Rate and Rhythm: Normal rate and regular rhythm.     Heart sounds: Normal heart sounds.  Pulmonary:     Effort: Pulmonary effort is normal.     Breath sounds: Normal breath sounds.  Abdominal:     Palpations: Abdomen is soft.     Tenderness: There is no abdominal tenderness.  Neurological:     Mental Status: Mental status is at baseline.     Data Reviewed: Relevant notes from primary care and specialist visits, past discharge summaries as available in EHR, including Care Everywhere. Prior diagnostic testing as pertinent to current admission diagnoses Updated medications and problem lists for reconciliation ED course, including vitals, labs, imaging, treatment and response to treatment Triage notes, nursing and pharmacy notes and ED provider's notes Notable results as noted in HPI    Family Communication: none Primary team communication: Dr Nicholaus Thank you very much for involving us  in the care of your patient.  Author: Delayne LULLA Solian, MD 08/11/2024 6:01 AM  For on call review [email or web address].

## 2024-11-27 ENCOUNTER — Other Ambulatory Visit: Payer: Self-pay

## 2024-11-27 ENCOUNTER — Emergency Department
Admission: EM | Admit: 2024-11-27 | Discharge: 2024-11-27 | Disposition: A | Discharge status: Home / Self Care | Attending: Emergency Medicine | Admitting: Emergency Medicine

## 2024-11-27 DIAGNOSIS — I10 Essential (primary) hypertension: Secondary | ICD-10-CM | POA: Insufficient documentation

## 2024-11-27 DIAGNOSIS — J45909 Unspecified asthma, uncomplicated: Secondary | ICD-10-CM | POA: Diagnosis not present

## 2024-11-27 DIAGNOSIS — Z79899 Other long term (current) drug therapy: Secondary | ICD-10-CM | POA: Diagnosis not present

## 2024-11-27 DIAGNOSIS — I2510 Atherosclerotic heart disease of native coronary artery without angina pectoris: Secondary | ICD-10-CM | POA: Diagnosis not present

## 2024-11-27 DIAGNOSIS — R030 Elevated blood-pressure reading, without diagnosis of hypertension: Secondary | ICD-10-CM | POA: Diagnosis present

## 2024-11-27 LAB — BASIC METABOLIC PANEL WITH GFR
Anion gap: 11 (ref 5–15)
BUN: 18 mg/dL (ref 8–23)
CO2: 25 mmol/L (ref 22–32)
Calcium: 9.7 mg/dL (ref 8.9–10.3)
Chloride: 103 mmol/L (ref 98–111)
Creatinine, Ser: 1.23 mg/dL — ABNORMAL HIGH (ref 0.44–1.00)
GFR, Estimated: 44 mL/min — ABNORMAL LOW
Glucose, Bld: 154 mg/dL — ABNORMAL HIGH (ref 70–99)
Potassium: 3.2 mmol/L — ABNORMAL LOW (ref 3.5–5.1)
Sodium: 139 mmol/L (ref 135–145)

## 2024-11-27 LAB — CBC
HCT: 41.1 % (ref 36.0–46.0)
Hemoglobin: 13.3 g/dL (ref 12.0–15.0)
MCH: 28.5 pg (ref 26.0–34.0)
MCHC: 32.4 g/dL (ref 30.0–36.0)
MCV: 88 fL (ref 80.0–100.0)
Platelets: 219 K/uL (ref 150–400)
RBC: 4.67 MIL/uL (ref 3.87–5.11)
RDW: 12.7 % (ref 11.5–15.5)
WBC: 6.4 K/uL (ref 4.0–10.5)
nRBC: 0 % (ref 0.0–0.2)

## 2024-11-27 NOTE — ED Triage Notes (Signed)
 Pt comes with c/o HTN. Pt states she got cold last night and woke up. Pt states she checked her bp and it was high. Pt states this morning it was high again in the 200s. Pt takes BP pill once daily and hasn't missed any doses. Pt denies any cp or other type of pain.   Pt denies any dizziness or blurry vision.

## 2024-11-27 NOTE — ED Provider Notes (Signed)
 [name] Provider Note    Event Date/Time   First MD Initiated Contact with Patient 11/27/24 1139     (approximate)   History   Hypertension   HPI  Cathy Hines is a 79 y.o. female with history of hyperal pressure, asthma, CAD, presenting with high blood pressure.  States that she noticed her blood pressure was high yesterday, took losartan .  Did not take her morning blood pressure medications.  She denies any headache or vision changes, no new weakness or numbness, no chest pain or shortness of breath, no other symptoms.  On independent chart review, she was admitted in September for hypertensive urgency, had her amlodipine  increased to 10 mg daily, had her losartan  increased to 100 mg daily.  Was continued on her metoprolol  50mg  daily and is on home Lasix  20 mg daily.     Physical Exam   Triage Vital Signs: ED Triage Vitals  Encounter Vitals Group     BP 11/27/24 1027 (!) 199/76     Girls Systolic BP Percentile --      Girls Diastolic BP Percentile --      Boys Systolic BP Percentile --      Boys Diastolic BP Percentile --      Pulse Rate 11/27/24 1027 68     Resp 11/27/24 1027 17     Temp 11/27/24 1027 98 F (36.7 C)     Temp src --      SpO2 11/27/24 1027 96 %     Weight 11/27/24 1028 137 lb (62.1 kg)     Height 11/27/24 1028 4' 10 (1.473 m)     Head Circumference --      Peak Flow --      Pain Score 11/27/24 1026 0     Pain Loc --      Pain Education --      Exclude from Growth Chart --     Most recent vital signs: Vitals:   11/27/24 1147 11/27/24 1150  BP:  (!) 189/76  Pulse:    Resp:    Temp:    SpO2: 96%      General: Awake, no distress.  CV:  Good peripheral perfusion.  Resp:  Normal effort.  No tachypnea or respiratory distress Abd:  No distention.  Soft nontender Other:  Moving all 4 extremities   ED Results / Procedures / Treatments   Labs (all labs ordered are listed, but only abnormal results are  displayed) Labs Reviewed  BASIC METABOLIC PANEL WITH GFR - Abnormal; Notable for the following components:      Result Value   Potassium 3.2 (*)    Glucose, Bld 154 (*)    Creatinine, Ser 1.23 (*)    GFR, Estimated 44 (*)    All other components within normal limits  CBC       PROCEDURES:  Critical Care performed: No  Procedures   MEDICATIONS ORDERED IN ED: Medications - No data to display   IMPRESSION / MDM / ASSESSMENT AND PLAN / ED COURSE  I reviewed the triage vital signs and the nursing notes.                              Differential diagnosis includes, but is not limited to, asymptomatic hypertension.  Labs were obtained out of triage.  Did offer to give her her missed morning blood pressure medications here but she states that  she can take it at home.  Did discuss with her about following up with primary care this week to recheck her blood pressure.  Did discuss with her extensively about symptoms to look out for to come back to the ED for evaluation.  Otherwise considered but no indication for inpatient admission at this time, she is safe for outpatient management.  Will discharge with strict return precautions.  Shared decision making done with patient and husband they are agreeable with this plan.  Patient's presentation is most consistent with severe exacerbation of chronic illness.  Independent interpretation of labs below.     Clinical Course as of 11/27/24 1158  Sun Nov 27, 2024  1157 Independent review of labs, no leukocytosis, electrolytes are not severely deranged, creatinine is mildly elevated, appears to be about baseline. [TT]    Clinical Course User Index [TT] Waymond Lorelle Cummins, MD     FINAL CLINICAL IMPRESSION(S) / ED DIAGNOSES   Final diagnoses:  Hypertension, unspecified type     Rx / DC Orders   ED Discharge Orders     None        Note:  This document was prepared using Dragon voice recognition software and may include  unintentional dictation errors.    Waymond Lorelle Cummins, MD 11/27/24 [phone]

## 2024-11-27 NOTE — Discharge Instructions (Addendum)
 Please make sure to take your blood pressure medications as prescribed.
